# Patient Record
Sex: Female | Born: 1955 | Race: Black or African American | Hispanic: No | Marital: Single | Smoking: Former smoker
Health system: Southern US, Community
[De-identification: ages and names within clinical notes are randomized; demographics above are authoritative.]

## PROBLEM LIST (undated history)

## (undated) DIAGNOSIS — R9431 Abnormal electrocardiogram [ECG] [EKG]: Secondary | ICD-10-CM

## (undated) DIAGNOSIS — K219 Gastro-esophageal reflux disease without esophagitis: Secondary | ICD-10-CM

## (undated) DIAGNOSIS — I209 Angina pectoris, unspecified: Secondary | ICD-10-CM

## (undated) DIAGNOSIS — N950 Postmenopausal bleeding: Secondary | ICD-10-CM

## (undated) DIAGNOSIS — G43909 Migraine, unspecified, not intractable, without status migrainosus: Secondary | ICD-10-CM

## (undated) DIAGNOSIS — R42 Dizziness and giddiness: Secondary | ICD-10-CM

## (undated) DIAGNOSIS — E7800 Pure hypercholesterolemia, unspecified: Secondary | ICD-10-CM

## (undated) DIAGNOSIS — I10 Essential (primary) hypertension: Secondary | ICD-10-CM

## (undated) DIAGNOSIS — I82402 Acute embolism and thrombosis of unspecified deep veins of left lower extremity: Secondary | ICD-10-CM

## (undated) DIAGNOSIS — R011 Cardiac murmur, unspecified: Secondary | ICD-10-CM

## (undated) HISTORY — PX: CHOLECYSTECTOMY: SHX55

## (undated) HISTORY — DX: Gastro-esophageal reflux disease without esophagitis: K21.9

---
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
## 2003-05-20 ENCOUNTER — Encounter: Payer: Self-pay | Admitting: Emergency Medicine

## 2003-05-20 ENCOUNTER — Encounter: Payer: Self-pay | Admitting: Cardiology

## 2003-05-20 ENCOUNTER — Inpatient Hospital Stay (HOSPITAL_COMMUNITY): Admission: EM | Admit: 2003-05-20 | Discharge: 2003-05-22 | Payer: Self-pay

## 2003-05-21 ENCOUNTER — Encounter: Payer: Self-pay | Admitting: Family Medicine

## 2003-06-05 ENCOUNTER — Encounter: Admission: RE | Admit: 2003-06-05 | Discharge: 2003-06-05 | Payer: Self-pay | Admitting: Internal Medicine

## 2003-08-30 ENCOUNTER — Emergency Department (HOSPITAL_COMMUNITY): Admission: EM | Admit: 2003-08-30 | Discharge: 2003-08-30 | Payer: Self-pay | Admitting: Emergency Medicine

## 2004-09-28 ENCOUNTER — Emergency Department (HOSPITAL_COMMUNITY): Admission: EM | Admit: 2004-09-28 | Discharge: 2004-09-28 | Payer: Self-pay | Admitting: Emergency Medicine

## 2005-01-04 ENCOUNTER — Emergency Department (HOSPITAL_COMMUNITY): Admission: EM | Admit: 2005-01-04 | Discharge: 2005-01-04 | Payer: Self-pay | Admitting: Emergency Medicine

## 2005-01-05 ENCOUNTER — Emergency Department (HOSPITAL_COMMUNITY): Admission: EM | Admit: 2005-01-05 | Discharge: 2005-01-06 | Payer: Self-pay | Admitting: Emergency Medicine

## 2005-01-10 ENCOUNTER — Encounter: Admission: RE | Admit: 2005-01-10 | Discharge: 2005-01-10 | Payer: Self-pay | Admitting: General Surgery

## 2005-01-13 ENCOUNTER — Encounter: Admission: RE | Admit: 2005-01-13 | Discharge: 2005-01-13 | Payer: Self-pay | Admitting: General Surgery

## 2005-01-18 ENCOUNTER — Encounter (INDEPENDENT_AMBULATORY_CARE_PROVIDER_SITE_OTHER): Payer: Self-pay | Admitting: *Deleted

## 2005-01-18 ENCOUNTER — Ambulatory Visit (HOSPITAL_COMMUNITY): Admission: RE | Admit: 2005-01-18 | Discharge: 2005-01-19 | Payer: Self-pay | Admitting: General Surgery

## 2007-10-17 ENCOUNTER — Emergency Department (HOSPITAL_COMMUNITY): Admission: EM | Admit: 2007-10-17 | Discharge: 2007-10-17 | Payer: Self-pay | Admitting: Emergency Medicine

## 2008-01-04 ENCOUNTER — Emergency Department (HOSPITAL_COMMUNITY): Admission: EM | Admit: 2008-01-04 | Discharge: 2008-01-04 | Payer: Self-pay | Admitting: Family Medicine

## 2009-10-16 HISTORY — PX: CHOLECYSTECTOMY: SHX55

## 2011-03-03 NOTE — Discharge Summary (Signed)
Kristi Meyers, Kristi Meyers NO.:  [id number]   MEDICAL RECORD NO.:  [id number]                   PATIENT TYPE:  INP   LOCATION:  3730                                 FACILITY:  [facility]   PHYSICIAN:  Foye Clock, MD                   DATE OF BIRTH:  05-23-1956   DATE OF ADMISSION:  05/19/2003  DATE OF DISCHARGE:  05/22/2003                                 DISCHARGE SUMMARY   DISCHARGE DIAGNOSES:  1. Hypertensive emergency.  2. Tobacco use for 30 years.  3. Marijuana use.  4. Abnormal electrocardiogram.  5. Anemia.   DISCHARGE MEDICATIONS:  1. Hydrochlorothiazide 25 mg 1 p.o. every day.  2. Metoprolol 50 mg 1 pill b.i.d.  3. Lotrel 5/21 pills b.i.d.  4. Aspirin 325 mg 1 pill daily.  5. Ferrous sulfate 325 mg 1 pill t.i.d.  6. Colace 1 pill b.i.d. as needed for constipation.   DISPOSITION:  The patient is stable. She will need follow up concerning  tobacco abuse with question of using nicotine replacement.   PROCEDURES PERFORMED:  1. Persantine Cardiolite.  2. A 2D echocardiogram.  3. Head CT scan without contrast.  4. Chest x-ray.   CONSULTS:  Eduardo Osier. Sharyn Lull, M.D.   HISTORY OF PRESENT ILLNESS:  Ms. Sinopoli is a 55 year old African American  female with a past medical history significant for hypertension, who quit  her medications approximately 6 months ago because she was concerned about  weight loss. Prior to admission she started having increased frequency of  headache and since then has tried over-the-counter aspirin, ibuprofen and  Tylenol. Earlier on the morning of admission she was sent home from work  when her symptoms were evaluated by an on-site nurse who found her blood  pressure to be greater than 200 plus systolic blood pressure. She denies  having any chest pain, shortness of breath, palpitations or diaphoresis. She  did have a transient episode of left sided weakness that lasted less than 30  minutes while at work and resolved  while she in the emergency department.  She does have positive risk factors of tobacco use for 30 years but denies  any  IV drug use, cocaine or ethanol and she has not been using over-the-  counter decongestants prior to admission.   ALLERGIES:  No known drug allergies.   PAST MEDICAL HISTORY:  Significant only for hypertension as listed above.  Treated at [facility].   SOCIAL HISTORY:  She is single, working at [employer] and lives in  [place].   FAMILY HISTORY:  Significant for no known  medical problems, no history of  MI, no history of cerebrovascular accident.   REVIEW OF SYSTEMS:  Positive for constipation.   PHYSICAL EXAMINATION:  VITAL SIGNS:  Temperature 97.5, pulse 84, blood  pressure 238/140, treated with Labetalol 20 mg IV x2 and Clonidine 0.2 mg  p.o. x1 and resulting  blood pressure was 185/108. Respiratory rate 20.  Oxygen saturation 99% on room air.  GENERAL: She is a well developed, well nourished African American female  lying in bed in no apparent distress.  HEENT:  Pupils equally round, reactive to light and accomodation.  Extraocular movements intact. No papilledema.  NECK:  No jugular venous distention, no masses.  RESPIRATORY:  Clear to auscultation bilaterally. Good air movement.  CARDIOVASCULAR:  Regular rate and rhythm. No rubs or gallops, occasional  ectopic beat, positive systolic murmur.  NEUROLOGIC:  Cranial nerves 2 to 12 grossly intact. No focal deficits. No  deficits found. She was symmetric. Sight she was appropriate.   LABORATORY DATA:  Chest x-ray showed cardiomegaly  with vascular congestion.  Head CT scan without contrast media showed right cerebellar and left frontal  infarct, likely more remote, however, consider further  evaluation  or  imaging as indicated. Also moderate periventricular white matter  hypodensity, question of small vessel ischemic  changes. An EKG showed  normal sinus rhythm with increased PR interval consistent with  1st degree AV  block, old septal infarct and left ventricular hypertrophy by voltage.  Inferior lateral T-wave inversions, no ST elevations or depressions.   LABORATORY DATA:  Sodium 139, potassium 3.6, chloride 106, CO2 25, BUN 13,  creatinine 0.9, glucose 94. White blood cell count 8.5, hemoglobin 13.1,  platelets 367, ANC 5.8, MCV 81, red cell distribution of 15.8. Bilirubin  0.6, alkaline phosphatase 63, AST 21, ALT 14, protein 7.9, albumin 3.9,  calcium 9.4.   HOSPITAL COURSE:  PROBLEM #1, HYPERTENSIVE URGENCY: Because of the  neurologic symptoms and elevated blood pressure on admission, a CT scan was  done to rule out the possibility of cerebrovascular accident. The results  were negative for an acute bleed. The patient was also treated acutely in  the emergency room for her blood pressure which resulted in systolic  blood  pressure of 160s to 180s over a diastolic blood pressure of the 90s to 100s.  The patient was started on hydrochlorothiazide, Metoprolol and Lotrel for  her blood pressure management. At the time of discharge the patient's blood  pressure was averaging systolic of 180s/100.   PROBLEM #2, ABNORMAL EKG:  Because her EKG showed normal sinus rhythm with  left ventricular hypertrophy and poor R-wave progression in leads V1 and V2  consistent with infarct, and she had a systolic murmur on examination, a 2D  echocardiogram was performed. The results stated that she had global  hypokinesis in the 60 to 70 anterior septal region with evidence of stress  induced ischemia and an ejection fraction of 87%.   Cardiology was consulted to determine if the EKG changes were due to left  ventricular hypertrophy versus coronary artery disease. The patient was seen  by Dr. Eduardo Osier. Harwani and was scheduled for a Persantine Cardiolite. After  reviewing the results of the Cardiolite and reviewing the results of the 2D echocardiogram, he  determined that the EKG changes were the  results of left  ventricular hypertrophy secondary to chronic uncontrolled hypertension. He  agreed with the management  of her hypertension and decided no further  evaluation  would be necessary at this time.   PROBLEM #3, TOBACCO AND MARIJUANA USE:  On admission the urine drug screen  showed positive for marijuana use. The patient was counseled on the risks of  tobacco and marijuana use. It was reiterated that tobacco increases the risk  of coronary artery disease. The patient was encouraged  to stop smoking as  well as stopping marijuana use. We discussed the option of using nicotine  replacements in the future. Because the patient had not at this time  considered a quit plan with a quit date, it was decided that we would follow  up this issue at her hospital  followup appointment.   PROBLEM #4, ANEMIA:  During the hospitalization  the patient was  found to  have a hemoglobin of 11, down from a hemoglobin of 13.1 on admission. She  was Guaiac negative. Her MCV was normal, but iron and ferritin were checked  and are pending at this time. The patient was, however, empirically treated  with ferrous sulfate for her anemia.   PROBLEM #5, DYSLIPIDEMIA:  As part of her cardiac workup, the patient was  tested for hyperlipidemia. Her laboratory results include a total  cholesterol of 186 with an HDL of 53 and an LDL of 123. Given risk factors  of tobacco abuse and hypertension, it was recommended that the patient begin  treatment for her dyslipidemia with a statin of choice as determined by her  primary care physician.   DISCHARGE LABORATORY DATA:  White blood cell count 5.5, hemoglobin 10.5,  hematocrit 30.9, platelets 382. Sodium 141, potassium 4, chloride 109, CO2  27, glucose 87, BUN 11, creatinine 0.9, calcium 8.9.                                                 Foye Clock, MD    JH/MEDQ  D:  05/31/2003  T:  05/31/2003  Job:  [id number]   cc:   [facility]   Eduardo Osier.  Sharyn Lull, M.D.  [address]  [place]  [place] [address]  Fax: [phone]

## 2011-03-03 NOTE — Op Note (Signed)
NAMETEQUILA, ROTTMANN NO.:  [id number]   MEDICAL RECORD NO.:  [id number]          PATIENT TYPE:  OIB   LOCATION:  2550                         FACILITY:  [facility]   PHYSICIAN:  Kristi Meyers, M.D.DATE OF BIRTH:  02/27/1956   DATE OF PROCEDURE:  01/18/2005  DATE OF DISCHARGE:                                 OPERATIVE REPORT   PREOPERATIVE DIAGNOSIS:  Chronic cholecystitis with stones.   POSTOPERATIVE DIAGNOSIS:  Chronic cholecystitis with stones. Questionable  common duct stone.   OPERATION PERFORMED:  Laparoscopic cholecystectomy with cholangiogram.   SURGEON:  Kristi Pancoast. Kristi Meyers, M.D.   ANESTHESIA:  General.   INDICATIONS FOR PROCEDURE:  Elloise Meyers is a 55 year old female whom I  first saw when I was on call for [facility]  approximately two weeks ago when she was seen in  the emergency room there.  She had presented with upper abdominal pain and had been seen by the  emergency room physician and ultrasound of the abdomen was obtained which  showed a contracted gallbladder with stones.  She had been seen in the  emergency room at [facility] the previous day and had been placed on antibiotics  for supposedly a urinary tract infection, but continued to have symptoms and  therefore had the following day gone to [facility]. I saw her and on  examination, her x-rays of the CT done when she was at [facility] showed that she  had a significantly dilated colon with a large amount of stool in the right  colon and I recommended that we try to get her cleaned out with laxatives  and then saw her back in the office.  On examination there, she was doing  better but was still kind of cramping and bloating and her abdominal pain is  not really localized to the right upper quadrant.  On examination in the  office, though she was certainly not acutely ill, I found that she had  strongly guaiac positive stools and recommended that we get a flat and  upright abdominal plain film which was done.  This showed that the large  stool and all had been evacuated and then I talked with Kristi Meyers and they  did an virtual colonoscopy that showed no abnormalities.  She was then  scheduled for a lap gallbladder and repeat examination today, she is not  distended.  There is no rectal stool and the little bit of stool that is  there is definitely Hemoccult negative.  Her hemoglobin, however, has  drifted down slightly and I don't have a real explanation for that.  Her  liver function studies are perfectly normal. I elected to proceed on with a  laparoscopic cholecystectomy.  We will kind of be following her stool  hemoccults in the office.   DESCRIPTION OF PROCEDURE:  The patient was taken to the operative suite.  She has 3 g of Unasyn.  She has PAS stockings and was positioned on the  operating room table.  Induction of general anesthesia and endotracheal  tube, oral tube into the stomach.  The abdomen was prepped  with Betadine  surgical solution.  She had had a previous little supraumbilical incision  for some type of laparoscopic procedure, probably gyn.  I opened carefully  below the umbilicus a little vertical incision.  The fascia was identified  and picked up between two Kochers and a small opening made into the  peritoneal cavity.  A pursestring suture was placed.  A Hasson cannula was  introduced and she does have this kind of contracted gallbladder.  There  were definitely adhesions around it. We did kind of a pantoscopic  view  after the upper 10 mm trocar had been placed and Kristi Meyers had placed the  two lateral 5 mm trocars.  There was no evidence of any peritoneal seeding  or other abnormality. She has got a few little adhesions in the pelvis.  There was no free fluid in the pelvis and the small bowel is definitely not  dilated.  The colon was completely unremarkable.  The gallbladder was  grasped, retracted upward and outward.  The  peritoneum was opened after the  adhesions had been taken down and then the gallbladder was identified as was  the cystic artery and there was a stone impacted in the neck of the cystic  duct, gallbladder junction.  This was flipped back into the gallbladder, a  clip placed and then a small opening made proximal to this and a  Cholangiocath placed.  X-ray was obtained and there certainly appears to be  a possible stone in the distal common bile duct. I could not get it to flush  with irrigating with saline, etc. and did not repeat the x-rays and will  wait for the results with the radiologists' interpretation.  Review of the  lab studies, all of her liver function studies have always been normal.  The  cystic duct was definitely freed up, identified, triply clipped and then  divided.  The cystic artery had been doubly divided, doubly clipped  proximally, distally singly and divided and then the gallbladder was freed  up from its bed in the liver.  The gallbladder was grasped with the camera  in the upper 10 mm port and the gallbladder brought out through the  umbilical fascial defect.  An additional figure-of-eight of 0 Vicryl was  placed in the fascia.  The fascia was anesthetized in the umbilicus and then  the reinspection, no evidence of any bleeding.  The irrigated fluid had been  removed and the 5 mm ports were drawn.  The carbon dioxide was released and  the upper 10 mm trocar withdrawn.  The patient tolerated the procedure  nicely and the subcutaneous wounds were closed with 4-0 Vicryl and the  Benzoin and Steri-Strips were placed on the skin.  The patient tolerated the  procedure nicely and was taken to the recovery room stable, extubated.  I  will check with the radiologists for their opinion and may ask one of the  gastroenterologists to see her, since really a common duct stone would sort  of explain her sort of vague abdominal symptoms that appeared to be different when she has  been seen at different times during this evaluation.      WJW/MEDQ  D:  01/18/2005  T:  01/18/2005  Job:  [id number]

## 2011-03-03 NOTE — Consult Note (Signed)
[name], CARRE NO.:  [id number]   MEDICAL RECORD NO.:  [id number]          PATIENT TYPE:  EMS   LOCATION:  ED                           FACILITY:  [facility]   PHYSICIAN:  Anselm Pancoast. Weatherly, M.D.DATE OF BIRTH:  1956/04/27   DATE OF CONSULTATION:  01/05/2005  DATE OF DISCHARGE:  01/06/2005                                   CONSULTATION   CHIEF COMPLAINT:  Abdominal pain.   HISTORY:  Kristi Meyers is a 55 year old black female who was seen in the  emergency room at [facility] on January 04, 2005; had acute abdominal  series, laboratory studies, amylase, lipase, and a Trichomonas was noted in  her urine and she was treated with Vicodin, Cipro, and also Compazine and  released. She started having more pain and this time came to the [facility]  emergency room where she was seen by Dr. Ethelda Chick. He thought that she was  tender in the upper abdomen and obtained an ultrasound that showed a  contracted gallbladder that was not thought to be acute, but called and  asked me to see her with the presumptive diagnosis of acute cholecystitis.  She had been given pain medication and I saw her an hour or two later, and  on examination when I saw her she was definitely not tender in the right  upper quadrant and states that the pain has been predominantly that of lower  abdomen, a cramping sensation, and pain with having bowel movements. I had  Dr. Stacie Acres, who was now working - Dr. Ethelda Chick had left - to examine the  patient and he was also in agreement that she was not tender in the right  upper quadrant and she was complaining of more pain on the right lower  abdomen on our examination. Her repeat white count today was 8200 where it  had been 8400 in the [facility] ER yesterday and we elected to go ahead and  proceed with a CT with oral and IV contrast. The CT was read as normal by  the radiologist, but on review of the CT she has a large amount of solid  stool in the right  colon, the cecum, transverse colon, there is not actually  stool in the rectum, but I think that this cramping pain that she is  experiencing is basically constipation and the gallbladder is definitely  thickened. 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 I think that  her symptoms that she is presently experiencing are the cramping related to  the constipation and I think that the CT contrast and if she will be on a  clear liquid diet for a couple of days will clean out her colon and this  cramping sensation will subside. There is no evidence of any diverticulitis  on the CT and there is no evidence of any obstruction of her colon. I have  asked that she follow up with [place] in the office and we will have her  scheduled for an elective  cholecystectomy, but I would definitely not take  the hydrocodone as I think that this is aggravating the constipation. Her  symptoms are more kind of cramping, bloated, constipation-type symptoms and  not that of an acute like a PID or appendicitis and etc. Dr. Ethelda Chick did  do a rectovaginal examination and describes that she was not having any  cramping or tenderness on vaginal manipulation.      WJW/MEDQ  D:  01/06/2005  T:  01/06/2005  Job:  [id number]

## 2011-03-29 ENCOUNTER — Emergency Department (HOSPITAL_COMMUNITY)
Admission: EM | Admit: 2011-03-29 | Discharge: 2011-03-29 | Disposition: A | Discharge status: Home / Self Care | Payer: BC Managed Care – PPO | Attending: Emergency Medicine | Admitting: Emergency Medicine

## 2011-03-29 DIAGNOSIS — R252 Cramp and spasm: Secondary | ICD-10-CM | POA: Insufficient documentation

## 2011-03-29 DIAGNOSIS — E876 Hypokalemia: Secondary | ICD-10-CM | POA: Insufficient documentation

## 2011-03-29 DIAGNOSIS — I10 Essential (primary) hypertension: Secondary | ICD-10-CM | POA: Insufficient documentation

## 2011-03-29 DIAGNOSIS — M79609 Pain in unspecified limb: Secondary | ICD-10-CM | POA: Insufficient documentation

## 2011-03-29 DIAGNOSIS — E785 Hyperlipidemia, unspecified: Secondary | ICD-10-CM | POA: Insufficient documentation

## 2011-03-29 DIAGNOSIS — Z79899 Other long term (current) drug therapy: Secondary | ICD-10-CM | POA: Insufficient documentation

## 2011-03-29 LAB — POCT I-STAT, CHEM 8
BUN: 11 mg/dL (ref 6–23)
Calcium, Ion: 1.16 mmol/L (ref 1.12–1.32)
Chloride: 107 mEq/L (ref 96–112)
Creatinine, Ser: 0.8 mg/dL (ref 0.4–1.2)
Glucose, Bld: 90 mg/dL (ref 70–99)
HCT: 36 % (ref 36.0–46.0)
Hemoglobin: 12.2 g/dL (ref 12.0–15.0)
Potassium: 3.4 mEq/L — ABNORMAL LOW (ref 3.5–5.1)
Sodium: 143 mEq/L (ref 135–145)
TCO2: 26 mmol/L (ref 0–100)

## 2011-05-24 ENCOUNTER — Ambulatory Visit: Payer: BC Managed Care – PPO | Admitting: Family Medicine

## 2015-07-07 ENCOUNTER — Ambulatory Visit: Payer: BC Managed Care – PPO | Admitting: Podiatry

## 2016-10-06 ENCOUNTER — Other Ambulatory Visit: Payer: Self-pay | Admitting: Cardiology

## 2016-10-06 DIAGNOSIS — R079 Chest pain, unspecified: Secondary | ICD-10-CM

## 2016-10-23 ENCOUNTER — Encounter (HOSPITAL_COMMUNITY): Admission: RE | Admit: 2016-10-23 | Payer: BC Managed Care – PPO | Source: Ambulatory Visit

## 2016-10-30 ENCOUNTER — Encounter (HOSPITAL_COMMUNITY): Admission: RE | Admit: 2016-10-30 | Payer: BC Managed Care – PPO | Source: Ambulatory Visit

## 2016-11-17 ENCOUNTER — Ambulatory Visit (HOSPITAL_COMMUNITY)
Admission: RE | Admit: 2016-11-17 | Discharge: 2016-11-17 | Disposition: A | Discharge status: Home / Self Care | Payer: BC Managed Care – PPO | Source: Ambulatory Visit | Attending: Cardiology | Admitting: Cardiology

## 2016-11-17 ENCOUNTER — Encounter (HOSPITAL_COMMUNITY): Payer: Self-pay | Admitting: Radiology

## 2016-11-17 DIAGNOSIS — R079 Chest pain, unspecified: Secondary | ICD-10-CM | POA: Diagnosis not present

## 2016-11-17 MED ORDER — REGADENOSON 0.4 MG/5ML IV SOLN
0.4000 mg | Freq: Once | INTRAVENOUS | Status: AC
  Administered 2016-11-17: 0.4 mg via INTRAVENOUS

## 2016-11-17 MED ORDER — TECHNETIUM TC 99M TETROFOSMIN IV KIT
10.0000 | PACK | Freq: Once | INTRAVENOUS | Status: AC | PRN
  Administered 2016-11-17: 10 via INTRAVENOUS

## 2016-11-17 MED ORDER — TECHNETIUM TC 99M TETROFOSMIN IV KIT
30.0000 | PACK | Freq: Once | INTRAVENOUS | Status: AC | PRN
  Administered 2016-11-17: 30 via INTRAVENOUS

## 2016-11-17 MED ORDER — REGADENOSON 0.4 MG/5ML IV SOLN
INTRAVENOUS | Status: AC
  Administered 2016-11-17: 0.4 mg via INTRAVENOUS
  Filled 2016-11-17: qty 5

## 2017-07-16 ENCOUNTER — Encounter (HOSPITAL_COMMUNITY): Payer: Self-pay | Admitting: Emergency Medicine

## 2017-07-16 ENCOUNTER — Ambulatory Visit (HOSPITAL_COMMUNITY)
Admission: EM | Admit: 2017-07-16 | Discharge: 2017-07-16 | Disposition: A | Discharge status: Home / Self Care | Payer: BC Managed Care – PPO | Attending: Internal Medicine | Admitting: Internal Medicine

## 2017-07-16 DIAGNOSIS — M25512 Pain in left shoulder: Secondary | ICD-10-CM | POA: Diagnosis not present

## 2017-07-16 DIAGNOSIS — M2550 Pain in unspecified joint: Secondary | ICD-10-CM

## 2017-07-16 DIAGNOSIS — M25561 Pain in right knee: Secondary | ICD-10-CM | POA: Diagnosis not present

## 2017-07-16 HISTORY — DX: Dizziness and giddiness: R42

## 2017-07-16 HISTORY — DX: Essential (primary) hypertension: I10

## 2017-07-16 HISTORY — DX: Pure hypercholesterolemia, unspecified: E78.00

## 2017-07-16 MED ORDER — METHYLPREDNISOLONE SODIUM SUCC 125 MG IJ SOLR
INTRAMUSCULAR | Status: AC
  Filled 2017-07-16: qty 2

## 2017-07-16 MED ORDER — MELOXICAM 7.5 MG PO TABS: 7.5000 mg | ORAL_TABLET | Freq: Two times a day (BID) | ORAL | 0 refills | Status: AC

## 2017-07-16 MED ORDER — METHYLPREDNISOLONE SODIUM SUCC 125 MG IJ SOLR
125.0000 mg | Freq: Once | INTRAMUSCULAR | Status: AC
  Administered 2017-07-16: 125 mg via INTRAMUSCULAR

## 2017-07-16 NOTE — Discharge Instructions (Addendum)
Achiness particularly in left shoulder and around right knee seem consistent with arthritis. Trial of meloxicam, anti-inflammatory/pain reliever, prescription sent to the pharmacy. Injection of Solu-Medrol given at the urgent care today. Note for work today and tomorrow. Follow-up with a sports medicine provider or orthopedist to discuss further treatment options, if symptoms persist.

## 2017-07-16 NOTE — ED Provider Notes (Signed)
[facility]    CSN: [id number] Arrival date & time: 07/16/17  1004     History   Chief Complaint Chief Complaint  Patient presents with  . Extremity Pain    HPI NECIE Meyers is a 61 y.o. female. She presents today with one-month history of achiness in her legs particularly behind the right knee and in her shoulders particularly the left shoulder. She thinks maybe the air conditioning at home is chilling her, she is better when she is up and around during the day, and has most difficulty at night and when she is first getting started in the morning.    HPI  Past Medical History:  Diagnosis Date  . High cholesterol   . Hypertension   . Vertigo      Past Surgical History:  Procedure Laterality Date  . CHOLECYSTECTOMY       Home Medications    Prior to Admission medications   Medication Sig Start Date End Date Taking? Authorizing Provider  atorvastatin (LIPITOR) 40 MG tablet Take 40 mg by mouth daily.   Yes [provider]  carvedilol (COREG) 25 MG tablet Take 25 mg by mouth 2 (two) times daily with a meal.   Yes [provider]  meclizine (ANTIVERT) 12.5 MG tablet Take 12.5 mg by mouth 2 (two) times daily.   Yes [provider]  Olmesartan-Amlodipine-HCTZ 40-10-25 MG TABS Take 1 tablet by mouth daily.   Yes [provider]  potassium chloride SA (K-DUR,KLOR-CON) 20 MEQ tablet Take 20 mEq by mouth daily.   Yes [provider]  meloxicam (MOBIC) 7.5 MG tablet Take 1 tablet (7.5 mg total) by mouth 2 (two) times daily. 07/16/17 07/26/17  Sherlene Shams, MD    Family History History reviewed. No pertinent family history.  Social History Social History  Substance Use Topics  . Smoking status: Current Some Day Smoker    Types: Cigarettes  . Smokeless tobacco: Never Used  . Alcohol use No     Allergies   Patient has no known allergies.   Review of Systems Review of Systems  All other systems reviewed  and are negative.    Physical Exam Triage Vital Signs ED Triage Vitals [07/16/17 1029]  Enc Vitals Group     BP 111/73     Pulse Rate 72     Resp      Temp 97.8 F (36.6 C)     Temp Source Oral     SpO2 100 %     Weight      Height      Pain Score      Pain Loc    Updated Vital Signs BP 111/73 (BP Location: Left Arm)   Pulse 72   Temp 97.8 F (36.6 C) (Oral)   LMP 11/17/2008 (Approximate)   SpO2 100%     Physical Exam  Constitutional: She is oriented to person, place, and time. No distress.  HENT:  Head: Atraumatic.  Eyes:  Conjugate gaze observed, no eye redness/discharge  Neck: Neck supple.  Cardiovascular: Normal rate and regular rhythm.   Pulmonary/Chest: No respiratory distress. She has no wheezes. She has no rales.  Lungs clear, symmetric breath sounds  Abdominal: She exhibits no distension.  Musculoskeletal: Normal range of motion. She exhibits no edema.  Range of motion in both knees is similar, patient is able to climb onto the exam table but slowly. No effusion palpated. No warmth. Able to raise both arms overhead, but discomfort  particularly on the left.  Neurological: She is alert and oriented to person, place, and time.  Skin: Skin is warm and dry.  Nursing note and vitals reviewed.    UC Treatments / Results   Procedures Procedures (including critical care time)  Medications Ordered in UC Medications  methylPREDNISolone sodium succinate (SOLU-MEDROL) 125 mg/2 mL injection 125 mg (125 mg Intramuscular Given 07/16/17 1130)   Final Clinical Impressions(s) / UC Diagnoses   Final diagnoses:  Arthralgia, unspecified joint   Achiness particularly in left shoulder and around right knee seem consistent with arthritis. Trial of meloxicam, anti-inflammatory/pain reliever, prescription sent to the pharmacy. Injection of Solu-Medrol given at the urgent care today. Note for work today and tomorrow. Follow-up with a sports medicine provider or orthopedist  to discuss further treatment options, if symptoms persist.  New Prescriptions New Prescriptions   MELOXICAM (MOBIC) 7.5 MG TABLET    Take 1 tablet (7.5 mg total) by mouth 2 (two) times daily.     Controlled Substance Prescriptions  Controlled Substance Registry consulted? No   Sherlene Shams, MD 07/18/17 2116

## 2017-07-16 NOTE — ED Triage Notes (Signed)
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  For the last month she has been having bilateral leg and arm pain.

## 2017-07-26 ENCOUNTER — Ambulatory Visit (INDEPENDENT_AMBULATORY_CARE_PROVIDER_SITE_OTHER): Payer: BC Managed Care – PPO | Admitting: Family Medicine

## 2017-07-26 ENCOUNTER — Encounter: Payer: Self-pay | Admitting: Family Medicine

## 2017-07-26 VITALS — BP 126/78 | HR 68 | Temp 98.4°F | Resp 14 | Ht 64.25 in | Wt 208.3 lb

## 2017-07-26 DIAGNOSIS — G8929 Other chronic pain: Secondary | ICD-10-CM | POA: Diagnosis not present

## 2017-07-26 DIAGNOSIS — Z7689 Persons encountering health services in other specified circumstances: Secondary | ICD-10-CM

## 2017-07-26 DIAGNOSIS — M25561 Pain in right knee: Secondary | ICD-10-CM | POA: Diagnosis not present

## 2017-07-26 DIAGNOSIS — I10 Essential (primary) hypertension: Secondary | ICD-10-CM

## 2017-07-26 DIAGNOSIS — M25472 Effusion, left ankle: Secondary | ICD-10-CM

## 2017-07-26 NOTE — Progress Notes (Signed)
Kristi Meyers presents to clinic today to establish care.  SUBJECTIVE: PMH: Pt is a 61 yo AAF with pmh sig for HTN.  Pt is followed by Cardiology, Dr. Leo Grosser? (office down from [facility]).  Knee pain, acute: -pt endorses knee pain x "a while" -Kristi Meyers endorses knee/leg aching when cold outside -recently seen by urgent care, told arthritis, given Mobic 7.5 mg and given Solu-Medrol IM -Kristi Meyers tried rubbing alcohol, Aspercreme -Kristi Meyers is out of Mobic, states helped some -Kristi Meyers endorses walking slow.  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 Kristi Meyers endorses smoking a few cigarettes per week. Her husband gets on her for smoking.  FMHx: Daughter-arthritis, DM   Past Medical History:  Diagnosis Date  . GERD (gastroesophageal reflux disease)   . High cholesterol   . Hypertension   . Vertigo     Past Surgical History:  Procedure Laterality Date  . CHOLECYSTECTOMY      Current Outpatient Prescriptions on File Prior to Visit  Medication Sig Dispense Refill  . atorvastatin (LIPITOR) 40 MG tablet Take 40 mg by mouth daily.    . carvedilol (COREG) 25 MG tablet Take 25 mg by mouth 2 (two) times daily with a meal.    . meclizine (ANTIVERT) 12.5 MG tablet Take 12.5 mg by mouth 2 (two) times daily.    . meloxicam (MOBIC) 7.5 MG tablet Take 1 tablet (7.5 mg total) by mouth 2 (two) times daily. 20 tablet 0  . Olmesartan-Amlodipine-HCTZ 40-10-25 MG TABS Take 1 tablet by mouth daily.    . potassium chloride SA (K-DUR,KLOR-CON)  20 MEQ tablet Take 20 mEq by mouth daily.     No current facility-administered medications on file prior to visit.     No Known Allergies  No family history on file.  Social History   Social History  . Marital status: Single    Spouse name: N/A  . Number of children: N/A  . Years of education: N/A   Occupational History  . Not on file.   Social History Main Topics  . Smoking status: Current Some Day Smoker    Types: Cigarettes  . Smokeless tobacco: Never Used  . Alcohol use No  . Drug use: No  . Sexual activity: Not on file   Other Topics Concern  . Not on file   Social History Narrative  . No narrative on file    ROS General: Denies fever, chills, night sweats, changes in weight, changes in appetite HEENT: Denies headaches, ear pain, changes in vision, rhinorrhea, sore throat CV: Denies CP, palpitations, SOB, orthopnea +Left ankle edema Pulm: Denies SOB, cough, wheezing GI: Denies abdominal pain, nausea, vomiting, diarrhea, constipation GU: Denies dysuria, hematuria, frequency, vaginal discharge Msk: Denies muscle cramps, joint pains  + right knee pain Neuro: Denies weakness, numbness, tingling Skin: Denies rashes, bruising Psych: Denies depression, anxiety, hallucinations  BP 126/78 (BP Location: Left Arm, Kristi Meyers Position: Sitting, Cuff Size: Large)   Pulse 68   Temp 98.4 F (36.9 C) (Oral)   Resp 14  Ht 5' 4.25" (1.632 m)   Wt 208 lb 5 oz (94.5 kg)   LMP 11/17/2008 (Approximate)   SpO2 97%   BMI 35.48 kg/m   Physical Exam Gen. Pleasant, well developed, well-nourished, in NAD HEENT - [place]/AT, PERRL, EOMI, conjunctive clear, no scleral icterus, no nasal drainage, pharynx without erythema or exudate. Lungs: no use of accessory muscles, CTAB, no wheezes, rales or rhonchi Cardiovascular: RRR, No r/g/m, no peripheral edema Abdomen: BS present, soft, nontender,nondistended Musculoskeletal: No deformities, moves all four extremities, no cyanosis or  clubbing, normal tone.  Left ankle with trace edema. Both knees normal in appearance. No tenderness to palpation of bilateral knees. Crepitus of bilateral knees. Neuro:  A&Ox3, CN II-XII intact, normal gait slightly slowed Skin:  Warm, dry, intact, no lesions Psych: normal affect, mood apropriate  No results found for this or any previous visit (from the past 2160 hour(s)).  Assessment/Plan: Chronic pain of right knee  -Discussed likely causes of knee pain, including arthritis -Kristi Meyers to try arthritis strength Tylenol 1000 mg twice a day or regular strength Tylenol if unable to find arthritis strength. -It is okay to continue using liniment creams when necessary -If pain continues despite Tylenol will consider NSAID or steroid injections - Plan: DG Knee Complete 4 Views Right  Essential hypertension -Followed by cardiology -Continue checking BP -Continue Coreg 25 mg twice a day and olmesartan-amlodipine daily  Edema of left ankle -Discussed possible causes including increased salt in diet, prolonged standing or sitting -Discussed elevating feet when sitting -Discussed TED/compression hose  Establish care -Records release   F/u in 1 month for knee pain

## 2017-07-26 NOTE — Patient Instructions (Addendum)
Please go to the [facility] on [address], [place] [id number] for your knee xray.  The Phone number to the clinic is [phone].   You can take Arthritis strength Tylenol 1000 mg twice a day as needed for your knee pain.  If you cannot find the Arthritis strength it is ok to take the regular Tylenol.   Try this for the next few weeks to see if your knee pain improves.  You can also use over the counter creams like Icy Hot, Aspercream, or Biofreeze to help with your knee pain.   Knee Pain, Adult Many things can cause knee pain. The pain often goes away on its own with time and rest. If the pain does not go away, tests may be done to find out what is causing the pain. Follow these instructions at home: Activity  Rest your knee.  Do not do things that cause pain.  Avoid activities where both feet leave the ground at the same time (high-impact activities). Examples are running, jumping rope, and doing jumping jacks. General instructions  Take medicines only as told by your doctor.  Raise (elevate) your knee when you are resting. Make sure your knee is higher than your heart.  Sleep with a pillow under your knee.  If told, put ice on the knee: ? Put ice in a plastic bag. ? Place a towel between your skin and the bag. ? Leave the ice on for 20 minutes, 2-3 times a day.  Ask your doctor if you should wear an elastic knee support.  Lose weight if you are overweight. Being overweight can make your knee hurt more.  Do not use any tobacco products. These include cigarettes, chewing tobacco, or electronic cigarettes. If you need help quitting, ask your doctor. Smoking may slow down healing. Contact a doctor if:  The pain does not stop.  The pain changes or gets worse.  You have a fever along with knee pain.  Your knee gives out or locks up.  Your knee swells, and becomes worse. Get help right away if:  Your knee feels warm.  You cannot move your knee.  You  have very bad knee pain.  You have chest pain.  You have trouble breathing. Summary  Many things can cause knee pain. The pain often goes away on its own with time and rest.  Avoid activities that put stress on your knee. These include running and jumping rope.  Get help right away if you cannot move your knee, or if your knee feels warm, or if you have trouble breathing. This information is not intended to replace advice given to you by your health care provider. Make sure you discuss any questions you have with your health care provider. Document Released: 12/29/2008 Document Revised: 09/26/2016 Document Reviewed: 09/26/2016 Elsevier Interactive Patient Education  2017 Elsevier Inc[employer]  Edema Edema is when you have too much fluid in your body or under your skin. Edema may make your legs, feet, and ankles swell up. Swelling is also common in looser tissues, like around your eyes. This is a common condition. It gets more common as you get older. There are many possible causes of edema. Eating too much salt (sodium) and being on your feet or sitting for a long time can cause edema in your legs, feet, and ankles. Hot weather may make edema worse. Edema is usually painless. Your skin may look swollen or shiny. Follow these instructions at home:  Keep  the swollen body part raised (elevated) above the level of your heart when you are sitting or lying down.  Do not sit still or stand for a long time.  Do not wear tight clothes. Do not wear garters on your upper legs.  Exercise your legs. This can help the swelling go down.  Wear elastic bandages or support stockings as told by your doctor.  Eat a low-salt (low-sodium) diet to reduce fluid as told by your doctor.  Depending on the cause of your swelling, you may need to limit how much fluid you drink (fluid restriction).  Take over-the-counter and prescription medicines only as told by your doctor. Contact a doctor if:  Treatment is not  working.  You have heart, liver, or kidney disease and have symptoms of edema.  You have sudden and unexplained weight gain. Get help right away if:  You have shortness of breath or chest pain.  You cannot breathe when you lie down.  You have pain, redness, or warmth in the swollen areas.  You have heart, liver, or kidney disease and get edema all of a sudden.  You have a fever and your symptoms get worse all of a sudden. Summary  Edema is when you have too much fluid in your body or under your skin.  Edema may make your legs, feet, and ankles swell up. Swelling is also common in looser tissues, like around your eyes.  Raise (elevate) the swollen body part above the level of your heart when you are sitting or lying down.  Follow your doctor's instructions about diet and how much fluid you can drink (fluid restriction). This information is not intended to replace advice given to you by your health care provider. Make sure you discuss any questions you have with your health care provider. Document Released: 03/20/2008 Document Revised: 10/20/2016 Document Reviewed: 10/20/2016 Elsevier Interactive Patient Education  2017 [place] can try wearing TED hose or Compression hose to help with the swelling in your left leg/ankle.

## 2017-10-29 ENCOUNTER — Encounter (HOSPITAL_COMMUNITY): Payer: Self-pay | Admitting: Emergency Medicine

## 2017-10-29 ENCOUNTER — Ambulatory Visit (HOSPITAL_COMMUNITY)
Admission: EM | Admit: 2017-10-29 | Discharge: 2017-10-29 | Disposition: A | Discharge status: Home / Self Care | Payer: BC Managed Care – PPO | Attending: Urgent Care | Admitting: Urgent Care

## 2017-10-29 ENCOUNTER — Other Ambulatory Visit: Payer: Self-pay

## 2017-10-29 DIAGNOSIS — I10 Essential (primary) hypertension: Secondary | ICD-10-CM

## 2017-10-29 DIAGNOSIS — M79604 Pain in right leg: Secondary | ICD-10-CM

## 2017-10-29 DIAGNOSIS — M25561 Pain in right knee: Secondary | ICD-10-CM | POA: Diagnosis not present

## 2017-10-29 DIAGNOSIS — R030 Elevated blood-pressure reading, without diagnosis of hypertension: Secondary | ICD-10-CM

## 2017-10-29 LAB — POCT I-STAT, CHEM 8
BUN: 12 mg/dL (ref 6–20)
Calcium, Ion: 1.19 mmol/L (ref 1.15–1.40)
Chloride: 106 mmol/L (ref 101–111)
Creatinine, Ser: 0.9 mg/dL (ref 0.44–1.00)
Glucose, Bld: 124 mg/dL — ABNORMAL HIGH (ref 65–99)
HCT: 40 % (ref 36.0–46.0)
Hemoglobin: 13.6 g/dL (ref 12.0–15.0)
Potassium: 3.9 mmol/L (ref 3.5–5.1)
Sodium: 145 mmol/L (ref 135–145)
TCO2: 27 mmol/L (ref 22–32)

## 2017-10-29 MED ORDER — TRAMADOL HCL 50 MG PO TABS: 50.0000 mg | ORAL_TABLET | Freq: Three times a day (TID) | ORAL | 0 refills | Status: DC | PRN

## 2017-10-29 MED ORDER — KETOROLAC TROMETHAMINE 60 MG/2ML IM SOLN
60.0000 mg | Freq: Once | INTRAMUSCULAR | Status: AC
  Administered 2017-10-29: 60 mg via INTRAMUSCULAR

## 2017-10-29 MED ORDER — KETOROLAC TROMETHAMINE 60 MG/2ML IM SOLN
INTRAMUSCULAR | Status: AC
  Filled 2017-10-29: qty 2

## 2017-10-29 NOTE — ED Triage Notes (Signed)
Pt c/o R leg pain for a couple of months, denies injury. Pain worse with ambulation.

## 2017-10-29 NOTE — Discharge Instructions (Addendum)
You may take 500mg  Tylenol every 6 hours for pain and inflammation. If you have severe pain, you may try 1000mg  Tylenol every 8 hours as needed. If your pain persists with the stronger dose of Tylenol then you may use Ultracet once every 8 hours as needed. Make sure you hydrate well with at least 2 liters (64 ounces or 1 gallon) of water every day.

## 2017-10-29 NOTE — ED Provider Notes (Signed)
MRN: [id number] DOB: Jan 14, 1956  Subjective:   Kristi Meyers is a 62 y.o. female presenting for 2 month history of recurrent right lower leg pain. Reports pain is throbbing in nature, mostly constant, worse with walking, prolonged standing. Has tried meloxicam without any relief from the last episode of pain she had. Denies fever, redness, swelling, trauma, falls. Has difficulty ambulating at work and is requesting a work note. Smokes a cigarette occasionally, used to smoke more. Drinks ~2 bottles of water per day. Regarding HTN, denies chest pain, heart racing, shob, n/v, abdominal pain.  No current facility-administered medications for this encounter.   Current Outpatient Medications:  .  atorvastatin (LIPITOR) 40 MG tablet, Take 40 mg by mouth daily., Disp: , Rfl:  .  carvedilol (COREG) 25 MG tablet, Take 25 mg by mouth 2 (two) times daily with a meal., Disp: , Rfl:  .  meclizine (ANTIVERT) 12.5 MG tablet, Take 12.5 mg by mouth 2 (two) times daily., Disp: , Rfl:  .  Olmesartan-Amlodipine-HCTZ 40-10-25 MG TABS, Take 1 tablet by mouth daily., Disp: , Rfl:  .  potassium chloride SA (K-DUR,KLOR-CON) 20 MEQ tablet, Take 20 mEq by mouth daily., Disp: , Rfl:    Brena has No Known Allergies.  Arissa  has a past medical history of GERD (gastroesophageal reflux disease), High cholesterol, Hypertension, and Vertigo. Also  has a past surgical history that includes Cholecystectomy.  Objective:   Vitals: BP (!) 153/100 (BP Location: Right Arm) Comment: Notified Tina  Pulse 83   Temp 98.3 F (36.8 C) (Oral)   Resp 18   LMP 11/17/2008 (Approximate)   SpO2 97%   Physical Exam  Constitutional: She is oriented to person, place, and time. She appears well-developed and well-nourished.  Cardiovascular: Normal rate, regular rhythm and intact distal pulses. Exam reveals no gallop and no friction rub.  No murmur heard. Pulmonary/Chest: Effort normal. No respiratory distress. She has no wheezes.  She has no rales.  Musculoskeletal: She exhibits no edema.       Right knee: She exhibits normal range of motion, no swelling, no effusion, no ecchymosis, no deformity, no laceration, no erythema, normal alignment, normal patellar mobility and no bony tenderness. Tenderness (posterior knee) found. No medial joint line, no lateral joint line and no patellar tendon tenderness noted.       Right lower leg: She exhibits no tenderness, no bony tenderness, no swelling (or erythema), no edema, no deformity and no laceration.  Neurological: She is alert and oriented to person, place, and time. She displays normal reflexes.  Skin: Skin is warm and dry.   Results for orders placed or performed during the hospital encounter of 10/29/17 (from the past 24 hour(s))  I-STAT, chem 8     Status: Abnormal   Collection Time: 10/29/17 11:30 AM  Result Value Ref Range   Sodium 145 135 - 145 mmol/L   Potassium 3.9 3.5 - 5.1 mmol/L   Chloride 106 101 - 111 mmol/L   BUN 12 6 - 20 mg/dL   Creatinine, Ser 0.90 0.44 - 1.00 mg/dL   Glucose, Bld 124 (H) 65 - 99 mg/dL   Calcium, Ion 1.19 1.15 - 1.40 mmol/L   TCO2 27 22 - 32 mmol/L   Hemoglobin 13.6 12.0 - 15.0 g/dL   HCT 40.0 36.0 - 46.0 %    Assessment and Plan :   Right leg pain  Posterior right knee pain  Essential hypertension  Elevated blood pressure reading  Suspect inflammatory type pain  likely due to underlying arthritis, worsened by nature of her work, lack of hydration. Schedule APAP, Toradol injection in clinic today. Use tramadol for breakthrough pain. Counseled patient on potential for adverse effects with medications prescribed today, patient verbalized understanding. Return-to-clinic precautions discussed, patient verbalized understanding.    Jaynee Eagles, PA-C 10/29/17 1239

## 2017-11-05 ENCOUNTER — Ambulatory Visit (INDEPENDENT_AMBULATORY_CARE_PROVIDER_SITE_OTHER)
Admission: RE | Admit: 2017-11-05 | Discharge: 2017-11-05 | Disposition: A | Discharge status: Home / Self Care | Payer: BC Managed Care – PPO | Source: Ambulatory Visit | Attending: Family Medicine | Admitting: Family Medicine

## 2017-11-05 DIAGNOSIS — G8929 Other chronic pain: Secondary | ICD-10-CM | POA: Diagnosis not present

## 2017-11-05 DIAGNOSIS — M25561 Pain in right knee: Secondary | ICD-10-CM

## 2017-11-08 ENCOUNTER — Encounter: Payer: Self-pay | Admitting: Family Medicine

## 2017-11-08 ENCOUNTER — Ambulatory Visit: Payer: BC Managed Care – PPO | Admitting: Family Medicine

## 2017-11-08 ENCOUNTER — Encounter: Payer: Self-pay | Admitting: Emergency Medicine

## 2017-11-08 VITALS — BP 112/80 | HR 81 | Temp 98.2°F | Wt 203.7 lb

## 2017-11-08 DIAGNOSIS — M1990 Unspecified osteoarthritis, unspecified site: Secondary | ICD-10-CM | POA: Diagnosis not present

## 2017-11-08 MED ORDER — DICLOFENAC SODIUM 1 % TD GEL: 2.0000 g | Freq: Three times a day (TID) | TRANSDERMAL | 2 refills | Status: DC | PRN

## 2017-11-08 NOTE — Patient Instructions (Addendum)

## 2017-11-08 NOTE — Progress Notes (Signed)
Subjective:    Patient ID: Kristi Meyers, female    DOB: 30-Oct-1955, 62 y.o.   MRN: [id number]  No chief complaint on file.   HPI Patient was seen today for f/u on R knee pain.  Pt was seen on 07/26/17 for R knee pain.  At that time pt was advised to have xrays done.  Pt just recently had xrays done on 11/05/17 showing moderate tricompartmental arthritis.  Pt was also seen on 10/29/17 for r leg pain 2/2 arthritis.  Pt was given a Toradol injection, told to schedule tylenol and given tramadol for breakthrough pain.  Pt states no of the medicines helped.  Pt endorses being "snappy" at people 2/2 being in pain.  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  The cold weather is also making pt's knee pain worse.  Past Medical History:  Diagnosis Date  . GERD (gastroesophageal reflux disease)   . High cholesterol   . Hypertension   . Vertigo     No Known Allergies  ROS General: Denies fever, chills, night sweats, changes in weight, changes in appetite HEENT: Denies headaches, ear pain, changes in vision, rhinorrhea, sore throat CV: Denies CP, palpitations, SOB, orthopnea Pulm: Denies SOB, cough, wheezing GI: Denies abdominal pain, nausea, vomiting, diarrhea, constipation GU: Denies dysuria, hematuria, frequency, vaginal discharge Msk: Denies muscle cramps    +R knee pain Neuro: Denies weakness, numbness, tingling Skin: Denies rashes, bruising Psych: Denies depression, anxiety, hallucinations     Objective:    Blood pressure 112/80, pulse 81, temperature 98.2 F (36.8 C), temperature source Oral, weight 203 lb 11.2 oz (92.4 kg), last menstrual period 11/17/2008.   Gen. Pleasant, well-nourished, in no distress, normal affect   HEENT: [place]/AT, face symmetric, conjunctiva clear, no scleral icterus, PERRLA, nares patent without drainage Lungs: no accessory muscle use, CTAB Cardiovascular: RRR, no peripheral edema Musculoskeletal: L leg > R leg.   No erythema or edema of R knee.  No crepitus of R knee or effusion, normal patellar tracking, no TTP of medial and lateral joint line, TTP of posterior aspect of joint and tibia just inferior to patella. no cyanosis or clubbing, normal tone Neuro:  A&Ox3, CN II-XII intact, normal gait   Wt Readings from Last 3 Encounters:  11/08/17 203 lb 11.2 oz (92.4 kg)  07/26/17 208 lb 5 oz (94.5 kg)    Lab Results  Component Value Date   HGB 13.6 10/29/2017   HCT 40.0 10/29/2017   GLUCOSE 124 (H) 10/29/2017   NA 145 10/29/2017   K 3.9 10/29/2017   CL 106 10/29/2017   CREATININE 0.90 10/29/2017   BUN 12 10/29/2017    Assessment/Plan:  Arthritis  -Discussed xray results.  Moderate tricompartmental arthritis -pt to try voltaren gel -if gel does not help will proceed with steroid injections. - Plan: diclofenac sodium (VOLTAREN) 1 % GEL -pt to f/u next wk if no improvement with the gel.  Grier Mitts, MD

## 2017-11-16 DIAGNOSIS — I82402 Acute embolism and thrombosis of unspecified deep veins of left lower extremity: Secondary | ICD-10-CM

## 2017-11-16 HISTORY — DX: Acute embolism and thrombosis of unspecified deep veins of left lower extremity: I82.402

## 2017-12-09 ENCOUNTER — Emergency Department (HOSPITAL_COMMUNITY): Payer: BC Managed Care – PPO

## 2017-12-09 ENCOUNTER — Encounter (HOSPITAL_COMMUNITY): Payer: Self-pay

## 2017-12-09 ENCOUNTER — Inpatient Hospital Stay (HOSPITAL_COMMUNITY)
Admission: EM | Admit: 2017-12-09 | Discharge: 2017-12-15 | DRG: 175 | Disposition: A | Discharge status: Skilled Nursing Facility | Payer: BC Managed Care – PPO | Attending: Internal Medicine | Admitting: Internal Medicine

## 2017-12-09 DIAGNOSIS — J9601 Acute respiratory failure with hypoxia: Secondary | ICD-10-CM | POA: Diagnosis present

## 2017-12-09 DIAGNOSIS — K5900 Constipation, unspecified: Secondary | ICD-10-CM | POA: Diagnosis present

## 2017-12-09 DIAGNOSIS — K219 Gastro-esophageal reflux disease without esophagitis: Secondary | ICD-10-CM | POA: Diagnosis present

## 2017-12-09 DIAGNOSIS — I2602 Saddle embolus of pulmonary artery with acute cor pulmonale: Secondary | ICD-10-CM

## 2017-12-09 DIAGNOSIS — I2692 Saddle embolus of pulmonary artery without acute cor pulmonale: Secondary | ICD-10-CM | POA: Diagnosis present

## 2017-12-09 DIAGNOSIS — R0603 Acute respiratory distress: Secondary | ICD-10-CM

## 2017-12-09 DIAGNOSIS — R739 Hyperglycemia, unspecified: Secondary | ICD-10-CM | POA: Diagnosis present

## 2017-12-09 DIAGNOSIS — E876 Hypokalemia: Secondary | ICD-10-CM | POA: Diagnosis not present

## 2017-12-09 DIAGNOSIS — I2699 Other pulmonary embolism without acute cor pulmonale: Secondary | ICD-10-CM

## 2017-12-09 DIAGNOSIS — R0602 Shortness of breath: Secondary | ICD-10-CM | POA: Diagnosis not present

## 2017-12-09 DIAGNOSIS — Z79899 Other long term (current) drug therapy: Secondary | ICD-10-CM

## 2017-12-09 DIAGNOSIS — I248 Other forms of acute ischemic heart disease: Secondary | ICD-10-CM | POA: Diagnosis present

## 2017-12-09 DIAGNOSIS — I82890 Acute embolism and thrombosis of other specified veins: Secondary | ICD-10-CM | POA: Diagnosis present

## 2017-12-09 DIAGNOSIS — N390 Urinary tract infection, site not specified: Secondary | ICD-10-CM

## 2017-12-09 DIAGNOSIS — R579 Shock, unspecified: Secondary | ICD-10-CM

## 2017-12-09 DIAGNOSIS — R578 Other shock: Secondary | ICD-10-CM | POA: Diagnosis present

## 2017-12-09 DIAGNOSIS — N179 Acute kidney failure, unspecified: Secondary | ICD-10-CM

## 2017-12-09 DIAGNOSIS — I82442 Acute embolism and thrombosis of left tibial vein: Secondary | ICD-10-CM | POA: Diagnosis present

## 2017-12-09 DIAGNOSIS — F17210 Nicotine dependence, cigarettes, uncomplicated: Secondary | ICD-10-CM | POA: Diagnosis present

## 2017-12-09 DIAGNOSIS — B9620 Unspecified Escherichia coli [E. coli] as the cause of diseases classified elsewhere: Secondary | ICD-10-CM | POA: Diagnosis present

## 2017-12-09 DIAGNOSIS — E7800 Pure hypercholesterolemia, unspecified: Secondary | ICD-10-CM | POA: Diagnosis present

## 2017-12-09 DIAGNOSIS — R7989 Other specified abnormal findings of blood chemistry: Secondary | ICD-10-CM

## 2017-12-09 DIAGNOSIS — R778 Other specified abnormalities of plasma proteins: Secondary | ICD-10-CM

## 2017-12-09 DIAGNOSIS — I714 Abdominal aortic aneurysm, without rupture: Secondary | ICD-10-CM | POA: Diagnosis present

## 2017-12-09 DIAGNOSIS — I82412 Acute embolism and thrombosis of left femoral vein: Secondary | ICD-10-CM | POA: Diagnosis present

## 2017-12-09 DIAGNOSIS — I82432 Acute embolism and thrombosis of left popliteal vein: Secondary | ICD-10-CM | POA: Diagnosis present

## 2017-12-09 DIAGNOSIS — Z9049 Acquired absence of other specified parts of digestive tract: Secondary | ICD-10-CM

## 2017-12-09 DIAGNOSIS — R040 Epistaxis: Secondary | ICD-10-CM | POA: Diagnosis not present

## 2017-12-09 DIAGNOSIS — I10 Essential (primary) hypertension: Secondary | ICD-10-CM | POA: Diagnosis present

## 2017-12-09 LAB — COMPREHENSIVE METABOLIC PANEL
ALT: 16 U/L (ref 14–54)
AST: 15 U/L (ref 15–41)
Albumin: 2.9 g/dL — ABNORMAL LOW (ref 3.5–5.0)
Alkaline Phosphatase: 71 U/L (ref 38–126)
Anion gap: 9 (ref 5–15)
BUN: 22 mg/dL — ABNORMAL HIGH (ref 6–20)
CO2: 22 mmol/L (ref 22–32)
Calcium: 8.3 mg/dL — ABNORMAL LOW (ref 8.9–10.3)
Chloride: 110 mmol/L (ref 101–111)
Creatinine, Ser: 1.24 mg/dL — ABNORMAL HIGH (ref 0.44–1.00)
GFR calc Af Amer: 52 mL/min — ABNORMAL LOW (ref >60)
GFR calc non Af Amer: 45 mL/min — ABNORMAL LOW (ref >60)
Glucose, Bld: 130 mg/dL — ABNORMAL HIGH (ref 65–99)
Potassium: 4.1 mmol/L (ref 3.5–5.1)
Sodium: 141 mmol/L (ref 135–145)
Total Bilirubin: 0.8 mg/dL (ref 0.3–1.2)
Total Protein: 6.4 g/dL — ABNORMAL LOW (ref 6.5–8.1)

## 2017-12-09 LAB — I-STAT TROPONIN, ED
Troponin i, poc: 0.41 ng/mL (ref 0.00–0.08)
Troponin i, poc: 0.36 ng/mL (ref 0.00–0.08)

## 2017-12-09 LAB — CBC WITH DIFFERENTIAL/PLATELET
Basophils Absolute: 0 10*3/uL (ref 0.0–0.1)
Basophils Relative: 0 %
Eosinophils Absolute: 0 10*3/uL (ref 0.0–0.7)
Eosinophils Relative: 0 %
HCT: 33.6 % — ABNORMAL LOW (ref 36.0–46.0)
Hemoglobin: 11 g/dL — ABNORMAL LOW (ref 12.0–15.0)
Lymphocytes Relative: 15 %
Lymphs Abs: 2 10*3/uL (ref 0.7–4.0)
MCH: 27.7 pg (ref 26.0–34.0)
MCHC: 32.7 g/dL (ref 30.0–36.0)
MCV: 84.6 fL (ref 78.0–100.0)
Monocytes Absolute: 1 10*3/uL (ref 0.1–1.0)
Monocytes Relative: 8 %
Neutro Abs: 9.9 10*3/uL — ABNORMAL HIGH (ref 1.7–7.7)
Neutrophils Relative %: 77 %
Platelets: 199 10*3/uL (ref 150–400)
RBC: 3.97 MIL/uL (ref 3.87–5.11)
RDW: 14.7 % (ref 11.5–15.5)
WBC: 13 10*3/uL — ABNORMAL HIGH (ref 4.0–10.5)

## 2017-12-09 LAB — URINALYSIS, ROUTINE W REFLEX MICROSCOPIC
Bilirubin Urine: NEGATIVE
Glucose, UA: NEGATIVE mg/dL
Hgb urine dipstick: NEGATIVE
Ketones, ur: 5 mg/dL — AB
Leukocytes, UA: NEGATIVE
Nitrite: NEGATIVE
Protein, ur: 100 mg/dL — AB
Specific Gravity, Urine: 1.021 (ref 1.005–1.030)
pH: 5 (ref 5.0–8.0)

## 2017-12-09 LAB — PROTIME-INR
INR: 1.18
Prothrombin Time: 14.9 seconds (ref 11.4–15.2)

## 2017-12-09 LAB — LIPASE, BLOOD: Lipase: 20 U/L (ref 11–51)

## 2017-12-09 LAB — I-STAT CG4 LACTIC ACID, ED
Lactic Acid, Venous: 1.43 mmol/L (ref 0.5–1.9)
Lactic Acid, Venous: 1.25 mmol/L (ref 0.5–1.9)

## 2017-12-09 LAB — MAGNESIUM: Magnesium: 2 mg/dL (ref 1.7–2.4)

## 2017-12-09 LAB — TSH: TSH: 1.187 u[IU]/mL (ref 0.350–4.500)

## 2017-12-09 MED ORDER — SODIUM CHLORIDE 0.9 % IV BOLUS (SEPSIS)
1000.0000 mL | Freq: Once | INTRAVENOUS | Status: AC
  Administered 2017-12-09: 1000 mL via INTRAVENOUS

## 2017-12-09 MED ORDER — ACETAMINOPHEN 325 MG PO TABS
650.0000 mg | ORAL_TABLET | Freq: Once | ORAL | Status: AC
  Administered 2017-12-09: 650 mg via ORAL
  Filled 2017-12-09: qty 2

## 2017-12-09 NOTE — ED Provider Notes (Signed)
[facility] DEPT Provider Note   CSN: [id number] Arrival date & time: 12/09/17  1712     History   Chief Complaint No chief complaint on file.   HPI Kristi Meyers is a 62 y.o. female.  The history is provided by the patient, a friend and medical records.  Shortness of Breath  This is a new problem. The average episode lasts 1 week. The problem occurs continuously.The current episode started more than 2 days ago. The problem has been rapidly worsening. Associated symptoms include a fever, cough and vomiting. Pertinent negatives include no headaches, no rhinorrhea, no neck pain, no wheezing, no chest pain, no syncope, no abdominal pain and no rash. She has tried nothing for the symptoms. The treatment provided no relief. Associated medical issues do not include CAD, DVT or recent surgery.    Past Medical History:  Diagnosis Date  . GERD (gastroesophageal reflux disease)   . High cholesterol   . Hypertension   . Vertigo     There are no active problems to display for this patient.   Past Surgical History:  Procedure Laterality Date  . CHOLECYSTECTOMY      OB History    No data available       Home Medications    Prior to Admission medications   Medication Sig Start Date End Date Taking? Authorizing Provider  atorvastatin (LIPITOR) 40 MG tablet Take 40 mg by mouth daily.    [provider]  carvedilol (COREG) 25 MG tablet Take 25 mg by mouth 2 (two) times daily with a meal.    [provider]  diclofenac sodium (VOLTAREN) 1 % GEL Apply 2 g topically 3 (three) times daily as needed. 11/08/17   Billie Ruddy, MD  meclizine (ANTIVERT) 12.5 MG tablet Take 12.5 mg by mouth 2 (two) times daily.    [provider]  Olmesartan-Amlodipine-HCTZ 40-10-25 MG TABS Take 1 tablet by mouth daily.    [provider]  potassium chloride SA (K-DUR,KLOR-CON) 20 MEQ tablet Take 20 mEq by mouth daily.    [provider]  traMADol (ULTRAM) 50 MG tablet Take 1 tablet (50 mg total) by mouth every 8 (eight) hours as needed for severe pain. 10/29/17   Jaynee Eagles, PA-C    Family History No family history on file.  Social History Social History   Tobacco Use  . Smoking status: Current Some Day Smoker    Types: Cigarettes  . Smokeless tobacco: Never Used  Substance Use Topics  . Alcohol use: No  . Drug use: No     Allergies   Patient has no known allergies.   Review of Systems Review of Systems  Constitutional: Positive for chills, fatigue and fever. Negative for diaphoresis.  HENT: Positive for congestion. Negative for rhinorrhea.   Eyes: Negative for visual disturbance.  Respiratory: Positive for cough, chest tightness and shortness of breath. Negative for wheezing and stridor.   Cardiovascular: Negative for chest pain, palpitations and syncope.  Gastrointestinal: Positive for nausea and vomiting. Negative for abdominal pain, constipation and diarrhea.  Genitourinary: Negative for dysuria.  Musculoskeletal: Negative for back pain and neck pain.  Skin: Negative for rash and wound.  Neurological: Positive for light-headedness. Negative for weakness and headaches.  Psychiatric/Behavioral: Negative for agitation.  All other systems reviewed and are negative.    Physical Exam Updated Vital Signs BP (!) 78/54   Resp 13   LMP 11/17/2008 (Approximate)   Physical Exam  Constitutional: She  is oriented to person, place, and time. She appears well-developed and well-nourished. No distress.  HENT:  Head: Normocephalic.  Mouth/Throat: Oropharynx is clear and moist. No oropharyngeal exudate.  Eyes: Conjunctivae and EOM are normal. Pupils are equal, round, and reactive to light.  Neck: Normal range of motion.  Cardiovascular: Normal rate and intact distal pulses.  No murmur heard. Pulmonary/Chest: No stridor. Tachypnea noted. No respiratory distress. She has no wheezes. She has  rhonchi. She exhibits no tenderness.  Abdominal: Soft. Bowel sounds are normal. She exhibits no distension. There is no tenderness.  Musculoskeletal: She exhibits no edema or tenderness.  Neurological: She is alert and oriented to person, place, and time. No cranial nerve deficit or sensory deficit. She exhibits normal muscle tone.  Skin: Capillary refill takes less than 2 seconds. No rash noted. She is not diaphoretic. No erythema.  Psychiatric: She has a normal mood and affect.  Nursing note and vitals reviewed.    ED Treatments / Results  Labs (all labs ordered are listed, but only abnormal results are displayed) Labs Reviewed  CBC WITH DIFFERENTIAL/PLATELET - Abnormal; Notable for the following components:      Result Value   WBC 13.0 (*)    Hemoglobin 11.0 (*)    HCT 33.6 (*)    Neutro Abs 9.9 (*)    All other components within normal limits  COMPREHENSIVE METABOLIC PANEL - Abnormal; Notable for the following components:   Glucose, Bld 130 (*)    BUN 22 (*)    Creatinine, Ser 1.24 (*)    Calcium 8.3 (*)    Total Protein 6.4 (*)    Albumin 2.9 (*)    GFR calc non Af Amer 45 (*)    GFR calc Af Amer 52 (*)    All other components within normal limits  URINALYSIS, ROUTINE W REFLEX MICROSCOPIC - Abnormal; Notable for the following components:   Color, Urine AMBER (*)    APPearance CLOUDY (*)    Ketones, ur 5 (*)    Protein, ur 100 (*)    Bacteria, UA MANY (*)    Squamous Epithelial / LPF 0-5 (*)    All other components within normal limits  I-STAT TROPONIN, ED - Abnormal; Notable for the following components:   Troponin i, poc 0.41 (*)    All other components within normal limits  I-STAT TROPONIN, ED - Abnormal; Notable for the following components:   Troponin i, poc 0.36 (*)    All other components within normal limits  URINE CULTURE  CULTURE, BLOOD (ROUTINE X 2)  CULTURE, BLOOD (ROUTINE X 2)  LIPASE, BLOOD  PROTIME-INR  TSH  MAGNESIUM  APTT  HEPARIN LEVEL  (UNFRACTIONATED)  CBC  HIV ANTIBODY (ROUTINE TESTING)  CBC  BASIC METABOLIC PANEL  MAGNESIUM  PHOSPHORUS  I-STAT CG4 LACTIC ACID, ED  I-STAT CG4 LACTIC ACID, ED    EKG  EKG Interpretation  Date/Time:  Sunday December 09 2017 19:03:18 EST Ventricular Rate:  85 PR Interval:    QRS Duration: 99 QT Interval:  397 QTC Calculation: 473 R Axis:   -53 Text Interpretation:  Sinus rhythm Left anterior fascicular block Probable left ventricular hypertrophy Anterior Q waves, possibly due to LVH Nonspecific T abnormalities, lateral leads When compared to prior, t wave now upright in leads 3, AVL, V4, and V5 improved from prior. T waves now inverted in lead3.  No STEMI Confirmed by Antony Blackbird [phone]) on 12/09/2017 7:30:12 PM       Radiology Dg Chest  2 View  Result Date: 12/09/2017 CLINICAL DATA:  Hypoxia and cough EXAM: CHEST  2 VIEW COMPARISON:  Chest radiograph 01/18/2005 FINDINGS: The heart size and mediastinal contours are within normal limits. Both lungs are clear. The visualized skeletal structures are unremarkable. IMPRESSION: No active cardiopulmonary disease. Electronically Signed   By: Ulyses Jarred M.D.   On: 12/09/2017 19:13   Ct Angio Chest Pe W And/or Wo Contrast  Addendum Date: 12/10/2017   ADDENDUM REPORT: 12/10/2017 01:11 ADDENDUM: Positive for acute PE with CT evidence of right heart strain (RV/LV Ratio = 1.74) consistent with at least submassive (intermediate risk) PE. The presence of right heart strain has been associated with an increased risk of morbidity and mortality. Please activate Code PE by paging [phone]. Electronically Signed   By: Anner Crete M.D.   On: 12/10/2017 01:11   Addendum Date: 12/10/2017   ADDENDUM REPORT: 12/10/2017 01:05 ADDENDUM: Positive for acute PE with CTevidence of right heart strain (RV/LV Ratio = 1.74) consistent with at least submassive (intermediate risk) PE. The presence of right heart strain has been associated with an increased  risk of morbidity and mortality. Electronically Signed   By: Anner Crete M.D.   On: 12/10/2017 01:05   Result Date: 12/10/2017 CLINICAL DATA:  62 year old female with shortness of breath. Generalized weakness and fatigue. EXAM: CT ANGIOGRAPHY CHEST WITH CONTRAST TECHNIQUE: Multidetector CT imaging of the chest was performed using the standard protocol during bolus administration of intravenous contrast. Multiplanar CT image reconstructions and MIPs were obtained to evaluate the vascular anatomy. CONTRAST:  100 cc Isovue 370 COMPARISON:  Chest radiograph dated 12/09/2017 FINDINGS: Cardiovascular: There is no cardiomegaly or pericardial effusion. There is dilatation of the right ventricle with bowing of the intraventricular septum to the left. The right ventricle measures 5.4 cm in diameter and the left ventricle measures 3.1 cm in diameter. Findings consistent with right ventricular strain. There is moderate calcified and noncalcified plaque of the thoracic aorta. There is a large saddle pulmonary radiology embolus straddling the bifurcation of the main pulmonary trunk and extending into the bilateral pulmonary arteries. There is occlusion of the lobar and segmental arteries of the lower lobes. There is dilatation of the main pulmonary trunk and central pulmonary arteries. Mediastinum/Nodes: There is no hilar or mediastinal adenopathy. Esophagus and the thyroid gland are grossly unremarkable. No mediastinal fluid collection. Lungs/Pleura: There is a 3 cm subpleural bleb in the left lung base. The lungs are otherwise clear. There is no pleural effusion or pneumothorax. The central airways are patent. Upper Abdomen: There is filling defect within the visualized intrahepatic IVC (series 5, image 245). This area is only partially visualized and not well evaluated. Although this may be related to mixing artifact findings is concerning for IVC thrombus. Further evaluation with CT of the abdomen pelvis with IV  contrast and delayed images in the venous phase recommended. Cholecystectomy. Musculoskeletal: Degenerative changes of the spine. No acute osseous pathology. Review of the MIP images confirms the above findings. IMPRESSION: 1. Large occlusive saddle embolus with findings of right heart straining. 2. Partially visualized filling defect in the intrahepatic IVC concerning for thrombus. Further evaluation with CT of the abdomen and pelvis with IV contrast in venous phase recommended. These results were called by telephone at the time of interpretation on 12/10/2017 at 12:46 am to Dr. Marda Stalker , who verbally acknowledged these results. Electronically Signed: By: Anner Crete M.D. On: 12/10/2017 00:54    Procedures Procedures (including critical care time)  .  CRITICAL CARE Performed by: Gwenyth Allegra [employer] Total critical care time: 60 minutes Critical care time was exclusive of separately billable procedures and treating other patients. Critical care was necessary to treat or prevent imminent or life-threatening deterioration. Critical care was time spent personally by me on the following activities: development of treatment plan with patient and/or surrogate as well as nursing, discussions with consultants, evaluation of patient's response to treatment, examination of patient, obtaining history from patient or surrogate, ordering and performing treatments and interventions, ordering and review of laboratory studies, ordering and review of radiographic studies, pulse oximetry and re-evaluation of patient's condition.   Medications Ordered in ED Medications  heparin ADULT infusion 100 units/mL (25000 units/270mL sodium chloride 0.45%) (1,200 Units/hr Intravenous New Bag/Given 12/10/17 0133)  0.9 %  sodium chloride infusion (not administered)  famotidine (PEPCID) tablet 20 mg (not administered)  ondansetron (ZOFRAN) injection 4 mg (not administered)  acetaminophen (TYLENOL) tablet 650 mg  (not administered)  0.9 %  sodium chloride infusion ( Intravenous New Bag/Given 12/10/17 0228)  sodium chloride 0.9 % bolus 1,000 mL (0 mLs Intravenous Stopped 12/09/17 2055)  acetaminophen (TYLENOL) tablet 650 mg (650 mg Oral Given 12/09/17 2221)  iopamidol (ISOVUE-370) 76 % injection 100 mL (100 mLs Intravenous Contrast Given 12/10/17 0028)  sodium chloride 0.9 % bolus 1,000 mL (1,000 mLs Intravenous New Bag/Given 12/10/17 0054)  heparin bolus via infusion 5,000 Units (5,000 Units Intravenous Bolus from Bag 12/10/17 0128)     Initial Impression / Assessment and Plan / ED Course  I have reviewed the triage vital signs and the nursing notes.  Pertinent labs & imaging results that were available during my care of the patient were reviewed by me and considered in my medical decision making (see chart for details).     Kristi Meyers is a 62 y.o. female with a past medical history significant for GERD, hypertension, and hypercholesterolemia who presents with fever, chills, fatigue, lightheadedness, nausea, vomiting, nonproductive cough, decreased urine, diarrhea.  Patient is calmly by family reports the patient has slowly been worsening for the last month.  He reports the last week it worsened and then today was the worst it has been.  Patient reports that today she started having diarrhea that was nonbloody.  Patient reports feelings of fatigue.  She also had nausea and vomiting yesterday.  It was nonbloody emesis.  She reports a intermittent dry cough.  No production.  She says her urine has been darker and slightly decreased.  She denies dysuria.  She has not taken her temperature home but felt very warm subjectively.  She reports some lower abdominal cramping when she vomits.  She denies any chest pain, palpitations headaches, vision changes or any other neurologic deficits.  Next  On arrival, patient's blood pressure was found to be in the 70s.  Patient says that she totally has hypertension and  this is new for her.  She was afebrile by nursing report.  Patient also was placed on 2 L nasal cannula for reported hypoxia.  On my exam, patient had some coarse breath sounds bilaterally.  Abdomen was nontender.  Chest was nontender.  Patient had no significant edema in her legs.  No rashes seen.  Patient had no focal neurologic deficits.  Patient is alert and oriented.  Patient is very fatigued however.  Patient will workup to look for etiology of symptoms.  Given the urinary symptoms and cough as well as new oxygen requirement of 2 L.  Patient will have chest  x-ray urinalysis and lab testing.  Patient given fluids during initial evaluation and blood pressure has improved into the 90s.  Next  Anticipate admission for hypotension.  Patient was found to have elevation in her troponin of 0.41.  Repeat EKG showed no evidence of STEMI.  Patient had previously inverted T waves that had improved as well as some new inversions.  Delta troponin was improving to 0.36.  Patient's lactic acid was normal x2.  Mild leukocytosis of 13.  Similar anemia to prior.  But reassuring slight elevation in creatinine from prior liver function and lipase.  Urinalysis did not show convincing evidence of infection with no nitrites or leukocytes.  Chest x-ray shows no pneumonia however given the patient's cough and hypoxia, still concerned for possible pneumonia.  Given the patient's hypoxia, and her elevated troponin, patient will have PE study to rule out pulmonary embolism prior to admission.  I am still concerned about the patient's constellation of symptoms and do not feel patient is safe to go home with her elevated troponins however will await CT scan before admission.  12:58 AM I personally reviewed the CT scan while the patient was still in the CT scanner and discovered evidence of saddle pulmonary embolism.  Radiology called to confirm my suspicion of saddle pulmonary embolism with the addition of right heart strain.    Critical care/pulmonology was called and they  will come evaluate the patient as she will likely need ICU level care.  The agreed with heparinization and they will likely call interventional radiology to discuss catheter directed TPA given her hypotension and troponin.  Radiology called to report that they think there may be clot in the IVC.  As the patient may be getting catheter directed therapy, will defer CT of the abdomen and pelvis imaging decision to critical care and interventional radiology for further management.    Final Clinical Impressions(s) / ED Diagnoses   Final diagnoses:  Acute saddle pulmonary embolism with acute cor pulmonale (HCC)    Clinical Impression: 1. Acute saddle pulmonary embolism with acute cor pulmonale (HCC)     Disposition: Admit  This note was prepared with assistance of Dragon voice recognition software. Occasional wrong-word or sound-a-like substitutions may have occurred due to the inherent limitations of voice recognition software.     [place], Gwenyth Allegra, MD 12/10/17 [phone]

## 2017-12-09 NOTE — ED Triage Notes (Signed)
Patient coming from home with c/o hypotension with pressure of 70/42 palpated HR 100. Pt c/o generalized weakness and fatigue. Pt given 500 ml of normal saline and Zofran mg. Current pressure 130/70 HR 80.

## 2017-12-09 NOTE — ED Notes (Signed)
Patient informed a urine sample is needed. 

## 2017-12-10 ENCOUNTER — Encounter (HOSPITAL_COMMUNITY): Payer: Self-pay

## 2017-12-10 ENCOUNTER — Inpatient Hospital Stay (HOSPITAL_COMMUNITY): Payer: BC Managed Care – PPO

## 2017-12-10 ENCOUNTER — Emergency Department (HOSPITAL_COMMUNITY): Payer: BC Managed Care – PPO

## 2017-12-10 DIAGNOSIS — R0603 Acute respiratory distress: Secondary | ICD-10-CM | POA: Diagnosis not present

## 2017-12-10 DIAGNOSIS — R000 Tachycardia, unspecified: Secondary | ICD-10-CM | POA: Diagnosis not present

## 2017-12-10 DIAGNOSIS — N179 Acute kidney failure, unspecified: Secondary | ICD-10-CM

## 2017-12-10 DIAGNOSIS — J9601 Acute respiratory failure with hypoxia: Secondary | ICD-10-CM | POA: Diagnosis not present

## 2017-12-10 DIAGNOSIS — I2692 Saddle embolus of pulmonary artery without acute cor pulmonale: Secondary | ICD-10-CM | POA: Diagnosis present

## 2017-12-10 DIAGNOSIS — R578 Other shock: Secondary | ICD-10-CM | POA: Diagnosis present

## 2017-12-10 DIAGNOSIS — K5900 Constipation, unspecified: Secondary | ICD-10-CM | POA: Diagnosis present

## 2017-12-10 DIAGNOSIS — I2602 Saddle embolus of pulmonary artery with acute cor pulmonale: Secondary | ICD-10-CM

## 2017-12-10 DIAGNOSIS — F17210 Nicotine dependence, cigarettes, uncomplicated: Secondary | ICD-10-CM | POA: Diagnosis present

## 2017-12-10 DIAGNOSIS — K219 Gastro-esophageal reflux disease without esophagitis: Secondary | ICD-10-CM | POA: Diagnosis present

## 2017-12-10 DIAGNOSIS — I82412 Acute embolism and thrombosis of left femoral vein: Secondary | ICD-10-CM | POA: Diagnosis present

## 2017-12-10 DIAGNOSIS — I824Y2 Acute embolism and thrombosis of unspecified deep veins of left proximal lower extremity: Secondary | ICD-10-CM | POA: Diagnosis not present

## 2017-12-10 DIAGNOSIS — R0602 Shortness of breath: Secondary | ICD-10-CM | POA: Diagnosis present

## 2017-12-10 DIAGNOSIS — Z79899 Other long term (current) drug therapy: Secondary | ICD-10-CM | POA: Diagnosis not present

## 2017-12-10 DIAGNOSIS — M7989 Other specified soft tissue disorders: Secondary | ICD-10-CM

## 2017-12-10 DIAGNOSIS — R0902 Hypoxemia: Secondary | ICD-10-CM | POA: Diagnosis not present

## 2017-12-10 DIAGNOSIS — R579 Shock, unspecified: Secondary | ICD-10-CM

## 2017-12-10 DIAGNOSIS — I82442 Acute embolism and thrombosis of left tibial vein: Secondary | ICD-10-CM | POA: Diagnosis present

## 2017-12-10 DIAGNOSIS — Z9049 Acquired absence of other specified parts of digestive tract: Secondary | ICD-10-CM | POA: Diagnosis not present

## 2017-12-10 DIAGNOSIS — E876 Hypokalemia: Secondary | ICD-10-CM | POA: Diagnosis not present

## 2017-12-10 DIAGNOSIS — R040 Epistaxis: Secondary | ICD-10-CM | POA: Diagnosis not present

## 2017-12-10 DIAGNOSIS — I82890 Acute embolism and thrombosis of other specified veins: Secondary | ICD-10-CM | POA: Diagnosis present

## 2017-12-10 DIAGNOSIS — E7800 Pure hypercholesterolemia, unspecified: Secondary | ICD-10-CM | POA: Diagnosis present

## 2017-12-10 DIAGNOSIS — I714 Abdominal aortic aneurysm, without rupture: Secondary | ICD-10-CM | POA: Diagnosis present

## 2017-12-10 DIAGNOSIS — I248 Other forms of acute ischemic heart disease: Secondary | ICD-10-CM | POA: Diagnosis present

## 2017-12-10 DIAGNOSIS — I82432 Acute embolism and thrombosis of left popliteal vein: Secondary | ICD-10-CM | POA: Diagnosis present

## 2017-12-10 DIAGNOSIS — I5030 Unspecified diastolic (congestive) heart failure: Secondary | ICD-10-CM | POA: Diagnosis not present

## 2017-12-10 DIAGNOSIS — R739 Hyperglycemia, unspecified: Secondary | ICD-10-CM | POA: Diagnosis present

## 2017-12-10 DIAGNOSIS — N390 Urinary tract infection, site not specified: Secondary | ICD-10-CM | POA: Diagnosis present

## 2017-12-10 DIAGNOSIS — B9620 Unspecified Escherichia coli [E. coli] as the cause of diseases classified elsewhere: Secondary | ICD-10-CM | POA: Diagnosis present

## 2017-12-10 DIAGNOSIS — I10 Essential (primary) hypertension: Secondary | ICD-10-CM | POA: Diagnosis present

## 2017-12-10 HISTORY — DX: Saddle embolus of pulmonary artery without acute cor pulmonale: I26.92

## 2017-12-10 HISTORY — PX: IR US GUIDE VASC ACCESS RIGHT: IMG2390

## 2017-12-10 HISTORY — PX: IR ANGIOGRAM SELECTIVE EACH ADDITIONAL VESSEL: IMG667

## 2017-12-10 HISTORY — PX: IR ANGIOGRAM PULMONARY BILATERAL SELECTIVE: IMG664

## 2017-12-10 HISTORY — PX: IR INFUSION THROMBOL ARTERIAL INITIAL (MS): IMG5376

## 2017-12-10 LAB — GLUCOSE, CAPILLARY
Glucose-Capillary: 85 mg/dL (ref 65–99)
Glucose-Capillary: 73 mg/dL (ref 65–99)

## 2017-12-10 LAB — CBC
HCT: 30.2 % — ABNORMAL LOW (ref 36.0–46.0)
HCT: 29.5 % — ABNORMAL LOW (ref 36.0–46.0)
HCT: 29.2 % — ABNORMAL LOW (ref 36.0–46.0)
Hemoglobin: 9.6 g/dL — ABNORMAL LOW (ref 12.0–15.0)
Hemoglobin: 9.4 g/dL — ABNORMAL LOW (ref 12.0–15.0)
Hemoglobin: 9.3 g/dL — ABNORMAL LOW (ref 12.0–15.0)
MCH: 26.9 pg (ref 26.0–34.0)
MCH: 27 pg (ref 26.0–34.0)
MCH: 27 pg (ref 26.0–34.0)
MCHC: 31.8 g/dL (ref 30.0–36.0)
MCHC: 31.9 g/dL (ref 30.0–36.0)
MCHC: 31.8 g/dL (ref 30.0–36.0)
MCV: 84.6 fL (ref 78.0–100.0)
MCV: 84.8 fL (ref 78.0–100.0)
MCV: 84.6 fL (ref 78.0–100.0)
Platelets: 157 10*3/uL (ref 150–400)
Platelets: 165 10*3/uL (ref 150–400)
Platelets: 161 10*3/uL (ref 150–400)
RBC: 3.57 MIL/uL — ABNORMAL LOW (ref 3.87–5.11)
RBC: 3.48 MIL/uL — ABNORMAL LOW (ref 3.87–5.11)
RBC: 3.45 MIL/uL — ABNORMAL LOW (ref 3.87–5.11)
RDW: 14.8 % (ref 11.5–15.5)
RDW: 15 % (ref 11.5–15.5)
RDW: 14.9 % (ref 11.5–15.5)
WBC: 11.2 10*3/uL — ABNORMAL HIGH (ref 4.0–10.5)
WBC: 10.6 10*3/uL — ABNORMAL HIGH (ref 4.0–10.5)
WBC: 11.7 10*3/uL — ABNORMAL HIGH (ref 4.0–10.5)

## 2017-12-10 LAB — FIBRINOGEN
Fibrinogen: 397 mg/dL (ref 210–475)
Fibrinogen: 379 mg/dL (ref 210–475)
Fibrinogen: 372 mg/dL (ref 210–475)

## 2017-12-10 LAB — ECHOCARDIOGRAM COMPLETE
Height: 62 in
Weight: 3185.21 oz

## 2017-12-10 LAB — HEPARIN LEVEL (UNFRACTIONATED)
Heparin Unfractionated: 0.96 IU/mL — ABNORMAL HIGH (ref 0.30–0.70)
Heparin Unfractionated: 0.73 IU/mL — ABNORMAL HIGH (ref 0.30–0.70)
Heparin Unfractionated: 0.55 IU/mL (ref 0.30–0.70)
Heparin Unfractionated: 0.39 IU/mL (ref 0.30–0.70)

## 2017-12-10 LAB — MRSA PCR SCREENING: MRSA by PCR: NEGATIVE

## 2017-12-10 MED ORDER — ALTEPLASE 50 MG IV SOLR
12.0000 mg | Freq: Once | INTRAVENOUS | Status: AC
  Administered 2017-12-10: 12 mg via INTRAVENOUS
  Filled 2017-12-10: qty 12

## 2017-12-10 MED ORDER — FENTANYL CITRATE (PF) 100 MCG/2ML IJ SOLN
INTRAMUSCULAR | Status: AC | PRN
  Administered 2017-12-10: 50 ug via INTRAVENOUS

## 2017-12-10 MED ORDER — IOPAMIDOL (ISOVUE-370) INJECTION 76%
100.0000 mL | Freq: Once | INTRAVENOUS | Status: AC | PRN
  Administered 2017-12-10: 100 mL via INTRAVENOUS

## 2017-12-10 MED ORDER — SODIUM CHLORIDE 0.9 % IV SOLN: 250.0000 mL | INTRAVENOUS | Status: DC | PRN

## 2017-12-10 MED ORDER — SODIUM CHLORIDE 0.9 % IV SOLN
INTRAVENOUS | Status: DC
  Administered 2017-12-10: 22:00:00 via INTRAVENOUS

## 2017-12-10 MED ORDER — IOPAMIDOL (ISOVUE-300) INJECTION 61%
INTRAVENOUS | Status: AC
  Administered 2017-12-10: 100 mL
  Filled 2017-12-10: qty 100

## 2017-12-10 MED ORDER — LIDOCAINE HCL 1 % IJ SOLN
INTRAMUSCULAR | Status: AC | PRN
  Administered 2017-12-10: 20 mL

## 2017-12-10 MED ORDER — LIDOCAINE HCL 1 % IJ SOLN
INTRAMUSCULAR | Status: AC
  Filled 2017-12-10: qty 20

## 2017-12-10 MED ORDER — SODIUM CHLORIDE 0.9% FLUSH: 3.0000 mL | INTRAVENOUS | Status: DC | PRN

## 2017-12-10 MED ORDER — HEPARIN BOLUS VIA INFUSION
5000.0000 [IU] | Freq: Once | INTRAVENOUS | Status: AC
  Administered 2017-12-10: 5000 [IU] via INTRAVENOUS
  Filled 2017-12-10: qty 5000

## 2017-12-10 MED ORDER — IOPAMIDOL (ISOVUE-370) INJECTION 76%
INTRAVENOUS | Status: AC
  Administered 2017-12-10: 100 mL via INTRAVENOUS
  Filled 2017-12-10: qty 100

## 2017-12-10 MED ORDER — HEPARIN (PORCINE) IN NACL 100-0.45 UNIT/ML-% IJ SOLN
1000.0000 [IU]/h | INTRAMUSCULAR | Status: DC
  Administered 2017-12-10: 1200 [IU]/h via INTRAVENOUS
  Administered 2017-12-10: 1000 [IU]/h via INTRAVENOUS
  Filled 2017-12-10 (×3): qty 250

## 2017-12-10 MED ORDER — ONDANSETRON HCL 4 MG/2ML IJ SOLN: 4.0000 mg | Freq: Four times a day (QID) | INTRAMUSCULAR | Status: DC | PRN

## 2017-12-10 MED ORDER — FAMOTIDINE 20 MG PO TABS
20.0000 mg | ORAL_TABLET | Freq: Two times a day (BID) | ORAL | Status: DC
  Administered 2017-12-10 (×2): 20 mg via ORAL
  Filled 2017-12-10 (×3): qty 1

## 2017-12-10 MED ORDER — SODIUM CHLORIDE 0.9 % IV BOLUS (SEPSIS)
1000.0000 mL | Freq: Once | INTRAVENOUS | Status: AC
  Administered 2017-12-10: 1000 mL via INTRAVENOUS

## 2017-12-10 MED ORDER — SODIUM CHLORIDE 0.9 % IV SOLN
INTRAVENOUS | Status: DC
  Administered 2017-12-10 – 2017-12-11 (×3): via INTRAVENOUS

## 2017-12-10 MED ORDER — IOPAMIDOL (ISOVUE-300) INJECTION 61%
INTRAVENOUS | Status: AC
  Administered 2017-12-10: 15 mL
  Filled 2017-12-10: qty 50

## 2017-12-10 MED ORDER — FENTANYL CITRATE (PF) 100 MCG/2ML IJ SOLN
INTRAMUSCULAR | Status: AC
  Filled 2017-12-10: qty 4

## 2017-12-10 MED ORDER — MIDAZOLAM HCL 2 MG/2ML IJ SOLN
INTRAMUSCULAR | Status: AC | PRN
Start: 2017-12-10 — End: 2017-12-10
  Administered 2017-12-10 (×2): 1 mg via INTRAVENOUS

## 2017-12-10 MED ORDER — MIDAZOLAM HCL 2 MG/2ML IJ SOLN
INTRAMUSCULAR | Status: AC
  Filled 2017-12-10: qty 4

## 2017-12-10 MED ORDER — ACETAMINOPHEN 325 MG PO TABS
650.0000 mg | ORAL_TABLET | ORAL | Status: DC | PRN
  Administered 2017-12-10 – 2017-12-15 (×18): 650 mg via ORAL
  Filled 2017-12-10 (×18): qty 2

## 2017-12-10 MED ORDER — SODIUM CHLORIDE 0.9 % IV SOLN
INTRAVENOUS | Status: DC
  Administered 2017-12-10: 10:00:00 via INTRAVENOUS

## 2017-12-10 MED ORDER — SODIUM CHLORIDE 0.9% FLUSH
3.0000 mL | Freq: Two times a day (BID) | INTRAVENOUS | Status: DC
  Administered 2017-12-10 – 2017-12-12 (×4): 3 mL via INTRAVENOUS

## 2017-12-10 MED ORDER — FENTANYL CITRATE (PF) 100 MCG/2ML IJ SOLN
25.0000 ug | Freq: Once | INTRAMUSCULAR | Status: AC
  Administered 2017-12-10: 25 ug via INTRAVENOUS
  Filled 2017-12-10: qty 2

## 2017-12-10 NOTE — Sedation Documentation (Signed)
Patient is resting comfortably. 

## 2017-12-10 NOTE — Progress Notes (Signed)
LE venous duplex prelim: DVT noted in the left femoral, popliteal, posterior tibial, and peroneal veins. No DVT RLE. Pedal pulses WNL documented with Doppler. Landry Mellow, RDMS, RVT

## 2017-12-10 NOTE — H&P (Signed)
PULMONARY / CRITICAL CARE MEDICINE   Name: Kristi Meyers MRN: [id number] DOB: [birth year]    ADMISSION DATE:  12/09/2017 CONSULTATION DATE: 12/09/17  REFERRING MD: Dr Tegeler (ER)  CHIEF COMPLAINT: SOB, Shock  HISTORY OF PRESENT ILLNESS:   62yoF with history of HTN, HLD, and GERD, presents to the ER tonight c/o SOB, Dizzyness, and Generalized weakness x 2 days. She also c/o constipation. She denies Cough, CP, Fever, or LE edema. In the ER she was found to be in shock with BP 70/42, which has since improved to 104/77 after 2L IVF. She reports her SBP as an outpatient is typically in 170's. CTA revealed bilateral PE's in right and left main PA's as well as saddle embolus and DVT within the IVF at the hepatic level. She was started on a Heparin gtt.   She says that her Right knee has been hurting her lately, and she has been going to bed a little earlier at night because of it. 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 She has not had any recent surgeries (only surgery ever was cholecystectomy 9 years ago). Denies history of CVA or GIB or head trauma. Denies any recent travel (car or plane) or being bed bound. Denies any other family members having had DVT's or PE's. Denies any history of malignancy.   PAST MEDICAL HISTORY :  She  has a past medical history of GERD (gastroesophageal reflux disease), High cholesterol, Hypertension, and Vertigo.  PAST SURGICAL HISTORY: She  has a past surgical history that includes Cholecystectomy.  No Known Allergies  No current facility-administered medications on file prior to encounter.    Current Outpatient Medications on File Prior to Encounter  Medication Sig  . atorvastatin (LIPITOR) 40 MG tablet Take 40 mg by mouth daily.  . carvedilol (COREG) 25 MG tablet Take 25 mg by mouth 2 (two) times daily with a meal.  . meclizine (ANTIVERT) 12.5 MG tablet Take 12.5 mg by mouth 2 (two) times daily.  . Olmesartan-Amlodipine-HCTZ 40-10-25  MG TABS Take 1 tablet by mouth daily.  . potassium chloride SA (K-DUR,KLOR-CON) 20 MEQ tablet Take 20 mEq by mouth daily.  . traMADol (ULTRAM) 50 MG tablet Take 1 tablet (50 mg total) by mouth every 8 (eight) hours as needed for severe pain.  Marland Kitchen diclofenac sodium (VOLTAREN) 1 % GEL Apply 2 g topically 3 (three) times daily as needed. (Patient not taking: Reported on 12/10/2017)   FAMILY HISTORY:  Her has no family status information on file.   SOCIAL HISTORY: She  reports that she has been smoking cigarettes.  she has never used smokeless tobacco. She reports that she does not drink alcohol or use drugs.  REVIEW OF SYSTEMS:   Review of Systems  Constitutional: Positive for malaise/fatigue.  HENT: Negative.   Eyes: Negative.   Respiratory: Positive for shortness of breath.   Cardiovascular: Negative.   Gastrointestinal: Negative.   Genitourinary: Negative.   Musculoskeletal: Positive for joint pain.  Skin: Negative.   Neurological: Positive for dizziness and weakness.  Endo/Heme/Allergies: Negative.   Psychiatric/Behavioral: Negative.    SUBJECTIVE:  Lying on ICU stretcher on 2L O2, reports still feels SOB even at rest.   VITAL SIGNS: BP 103/74 (BP Location: Right Arm)   Pulse 79   Temp 99 F (37.2 C) (Rectal)   Resp 17   Wt 92.4 kg (203 lb 11.3 oz)   LMP 11/17/2008 (Approximate)   SpO2 100%   BMI 34.69 kg/m   HEMODYNAMICS:  soft BP; not on vasopressors  INTAKE / OUTPUT: No intake/output data recorded.  PHYSICAL EXAMINATION: General: Adult female, WDWN, in NAD Neuro: AAOx3, moving all extremities HEENT: OP clear, MM moist Cardiovascular: RRR no m/r/g Lungs: CTA b/l Abdomen: Soft NTND Musculoskeletal: Trace BLE edema; both legs appear roughly equal in size. Skin: no rashes   LABS:  BMET Recent Labs  Lab 12/09/17 1837  NA 141  K 4.1  CL 110  CO2 22  BUN 22*  CREATININE 1.24*  GLUCOSE 130*   Electrolytes Recent Labs  Lab 12/09/17 1837  CALCIUM 8.3*   MG 2.0   CBC Recent Labs  Lab 12/09/17 1837  WBC 13.0*  HGB 11.0*  HCT 33.6*  PLT 199   Coag's Recent Labs  Lab 12/09/17 1837  INR 1.18   Sepsis Markers Recent Labs  Lab 12/09/17 1845 12/09/17 2106  LATICACIDVEN 1.43 1.25   ABG No results for input(s): PHART, PCO2ART, PO2ART in the last 168 hours.  Liver Enzymes Recent Labs  Lab 12/09/17 1837  AST 15  ALT 16  ALKPHOS 71  BILITOT 0.8  ALBUMIN 2.9*   Cardiac Enzymes No results for input(s): TROPONINI, PROBNP in the last 168 hours.  Glucose No results for input(s): GLUCAP in the last 168 hours.  Imaging Dg Chest 2 View  Result Date: 12/09/2017 CLINICAL DATA:  Hypoxia and cough EXAM: CHEST  2 VIEW COMPARISON:  Chest radiograph 01/18/2005 FINDINGS: The heart size and mediastinal contours are within normal limits. Both lungs are clear. The visualized skeletal structures are unremarkable. IMPRESSION: No active cardiopulmonary disease. Electronically Signed   By: Ulyses Jarred M.D.   On: 12/09/2017 19:13   Ct Angio Chest Pe W And/or Wo Contrast  Addendum Date: 12/10/2017   ADDENDUM REPORT: 12/10/2017 01:11 ADDENDUM: Positive for acute PE with CT evidence of right heart strain (RV/LV Ratio = 1.74) consistent with at least submassive (intermediate risk) PE. The presence of right heart strain has been associated with an increased risk of morbidity and mortality. Please activate Code PE by paging [phone]. Electronically Signed   By: Anner Crete M.D.   On: 12/10/2017 01:11   Addendum Date: 12/10/2017   ADDENDUM REPORT: 12/10/2017 01:05 ADDENDUM: Positive for acute PE with CTevidence of right heart strain (RV/LV Ratio = 1.74) consistent with at least submassive (intermediate risk) PE. The presence of right heart strain has been associated with an increased risk of morbidity and mortality. Electronically Signed   By: Anner Crete M.D.   On: 12/10/2017 01:05   Result Date: 12/10/2017 CLINICAL DATA:  62 year old  female with shortness of breath. Generalized weakness and fatigue. EXAM: CT ANGIOGRAPHY CHEST WITH CONTRAST TECHNIQUE: Multidetector CT imaging of the chest was performed using the standard protocol during bolus administration of intravenous contrast. Multiplanar CT image reconstructions and MIPs were obtained to evaluate the vascular anatomy. CONTRAST:  100 cc Isovue 370 COMPARISON:  Chest radiograph dated 12/09/2017 FINDINGS: Cardiovascular: There is no cardiomegaly or pericardial effusion. There is dilatation of the right ventricle with bowing of the intraventricular septum to the left. The right ventricle measures 5.4 cm in diameter and the left ventricle measures 3.1 cm in diameter. Findings consistent with right ventricular strain. There is moderate calcified and noncalcified plaque of the thoracic aorta. There is a large saddle pulmonary radiology embolus straddling the bifurcation of the main pulmonary trunk and extending into the bilateral pulmonary arteries. There is occlusion of the lobar and segmental arteries of the lower lobes. There is dilatation of  the main pulmonary trunk and central pulmonary arteries. Mediastinum/Nodes: There is no hilar or mediastinal adenopathy. Esophagus and the thyroid gland are grossly unremarkable. No mediastinal fluid collection. Lungs/Pleura: There is a 3 cm subpleural bleb in the left lung base. The lungs are otherwise clear. There is no pleural effusion or pneumothorax. The central airways are patent. Upper Abdomen: There is filling defect within the visualized intrahepatic IVC (series 5, image 245). This area is only partially visualized and not well evaluated. Although this may be related to mixing artifact findings is concerning for IVC thrombus. Further evaluation with CT of the abdomen pelvis with IV contrast and delayed images in the venous phase recommended. Cholecystectomy. Musculoskeletal: Degenerative changes of the spine. No acute osseous pathology. Review of  the MIP images confirms the above findings. IMPRESSION: 1. Large occlusive saddle embolus with findings of right heart straining. 2. Partially visualized filling defect in the intrahepatic IVC concerning for thrombus. Further evaluation with CT of the abdomen and pelvis with IV contrast in venous phase recommended. These results were called by telephone at the time of interpretation on 12/10/2017 at 12:46 am to Dr. Marda Stalker , who verbally acknowledged these results. Electronically Signed: By: Anner Crete M.D. On: 12/10/2017 00:54   SIGNIFICANT EVENTS: 2/23 PM: presented to [facility] ER with shock, sob, and weakness, found to have massive PE with saddle embolus. BP improved with IVF's but remained soft.   LINES/TUBES: 1-20G and 1-22G PIV  DISCUSSION: 62yoF with history of HTN, HLD, and GERD, presents to the ER tonight c/o SOB, Dizzyness, and Generalized weakness x 2 days, found to have massive PE with large bilateral clot burden and saddle embolus.  ASSESSMENT / PLAN:  PULMONARY 1. Massive PE: - patient initially in shock that only partially responded to IVF's (BP 70/42 up to now 103/74, in patient with baseline SBP 170's per patient) - CTA Chest on my review shows bilateral PE's in both right and left PA's as well as saddle embolus and a clot in the IVC at level of the liver. There are signs of RV strain on the CT with RV/LV ratio of 1.74.  - agree with heparin gtt that patient is already on - strict bed rest - ordered BLE dopplers and TTE - discussed with patient that I am concerned that if the DVT within the IVC also embolizes, she may end up in shock again or worse in cardiac arrest. She is a good candidate for lytics (no history of CVA, head trauma, GIB; only surgery was cholecystectomy 68yrs ago), but the question is if she needs systemic vs catheter-directed lytics. I would like to give her catheter directed if she stays hemodynamically stable, given the lower risk of bleeding from  the lower dose of TPA. Have discussed her case with IR Physician who wants her transferred over from [facility] to [facility]; if she remains stable after transport, he will come in and evaluate her.  - s/p 2L IVF bolus; start NS @ 125cc/hr  CARDIOVASCULAR 1. Shock - improved after IVF's; still soft BP - hold home antihypertensive medications  RENAL 1. AKI: - creatinine 1.24 on admission, up from 0.90; this is before the IVF's she received though; expect creatinine is now back to baseline - recheck BMP in AM  GASTROINTESTINAL 1. Hx GERD: - H2 blocker BID  HEMATOLOGIC 1. DVT/PE: see plan above  INFECTIOUS No active issues   ENDOCRINE No active issues   NEUROLOGIC No active issues    FAMILY  - Updates:  discussed plan with patient; patient's daughter is on the phone and discussed plan with her as well - Inter-disciplinary family meet or Palliative Care meeting due by:  12/16/17  60 minutes critical care time  Vernie Murders, MD  Pulmonary and [place] Pager: [phone]  12/10/2017, 2:07 AM

## 2017-12-10 NOTE — Progress Notes (Addendum)
PULMONARY / CRITICAL CARE MEDICINE   Name: Kristi Meyers MRN: [id number] DOB: May 19, 1956    ADMISSION DATE:  12/09/2017 CONSULTATION DATE:  12/09/17  REFERRING MD:  Tegeler  CHIEF COMPLAINT:  SOB, Shock  HISTORY OF PRESENT ILLNESS:    62yoF with history of HTN, HLD, and GERD, presents to the ER tonight c/o SOB, Dizzyness, and Generalized weakness x 2 days. She also c/o constipation. She denies Cough, CP, Fever, or LE edema. In the ER she was found to be in shock with BP 70/42, which has since improved to 104/77 after 2L IVF. She reports her SBP as an outpatient is typically in 170's. CTA revealed bilateral PE's in right and left main PA's as well as saddle embolus and DVT within the IVF at the hepatic level. She was started on a Heparin gtt.   She says that her Right knee has been hurting her lately, and she has been going to bed a little earlier at night because of it. 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 She has not had any recent surgeries (only surgery ever was cholecystectomy 9 years ago). Denies history of CVA or GIB or head trauma. Denies any recent travel (car or plane) or being bed bound. Denies any other family members having had DVT's or PE's. Denies any history of malignancy.   PAST MEDICAL HISTORY :  She  has a past medical history of GERD (gastroesophageal reflux disease), High cholesterol, Hypertension, and Vertigo.  PAST SURGICAL HISTORY: She  has a past surgical history that includes Cholecystectomy.  No Known Allergies  No current facility-administered medications on file prior to encounter.    Current Outpatient Medications on File Prior to Encounter  Medication Sig  . atorvastatin (LIPITOR) 40 MG tablet Take 40 mg by mouth daily.  . carvedilol (COREG) 25 MG tablet Take 25 mg by mouth 2 (two) times daily with a meal.  . meclizine (ANTIVERT) 12.5 MG tablet Take 12.5 mg by mouth 2 (two) times daily.  . Olmesartan-Amlodipine-HCTZ 40-10-25  MG TABS Take 1 tablet by mouth daily.  . potassium chloride SA (K-DUR,KLOR-CON) 20 MEQ tablet Take 20 mEq by mouth daily.  . traMADol (ULTRAM) 50 MG tablet Take 1 tablet (50 mg total) by mouth every 8 (eight) hours as needed for severe pain.  Marland Kitchen diclofenac sodium (VOLTAREN) 1 % GEL Apply 2 g topically 3 (three) times daily as needed. (Patient not taking: Reported on 12/10/2017)    FAMILY HISTORY:  Her has no family status information on file.    SOCIAL HISTORY: She  reports that she has been smoking cigarettes.  she has never used smokeless tobacco. She reports that she does not drink alcohol or use drugs.  REVIEW OF SYSTEMS:   Reports SOB, chest pain is much improved. Endorses LE "stiffness".   SUBJECTIVE:  Headed to IR for further eval for lytics  VITAL SIGNS: BP 91/65   Pulse 84   Temp 98.7 F (37.1 C) (Oral)   Resp (!) 23   Ht 5\' 2"  (1.575 m)   Wt 199 lb 1.2 oz (90.3 kg)   LMP 11/17/2008 (Approximate)   SpO2 99%   BMI 36.41 kg/m   HEMODYNAMICS:    VENTILATOR SETTINGS:    INTAKE / OUTPUT: I/O last 3 completed shifts: In: 3274 [I.V.:1274; IV Piggyback:2000] Out: 275 [Urine:275]  PHYSICAL EXAMINATION: General: elderly AA female laying in bed in NAD, [place] in place Neuro: AAOx3, moving all extremities HEENT: OP clear, MM moist Cardiovascular:  RRR no m/r/g Lungs: CTA b/l Abdomen: Soft NTND Musculoskeletal: Trace BLE edema; both legs appear roughly equal in size. Skin: no rashes, skin in tact  LABS:  BMET Recent Labs  Lab 12/09/17 1837  NA 141  K 4.1  CL 110  CO2 22  BUN 22*  CREATININE 1.24*  GLUCOSE 130*    Electrolytes Recent Labs  Lab 12/09/17 1837  CALCIUM 8.3*  MG 2.0    CBC Recent Labs  Lab 12/09/17 1837  WBC 13.0*  HGB 11.0*  HCT 33.6*  PLT 199   Coag's Recent Labs  Lab 12/09/17 1837  INR 1.18   Sepsis Markers Recent Labs  Lab 12/09/17 1845 12/09/17 2106  LATICACIDVEN 1.43 1.25   ABG No results for input(s): PHART,  PCO2ART, PO2ART in the last 168 hours.  Liver Enzymes Recent Labs  Lab 12/09/17 1837  AST 15  ALT 16  ALKPHOS 71  BILITOT 0.8  ALBUMIN 2.9*   Cardiac Enzymes No results for input(s): TROPONINI, PROBNP in the last 168 hours.  Glucose Recent Labs  Lab 12/10/17 0422 12/10/17 0717  GLUCAP 85 73    Imaging Dg Chest 2 View  Result Date: 12/09/2017 CLINICAL DATA:  Hypoxia and cough EXAM: CHEST  2 VIEW COMPARISON:  Chest radiograph 01/18/2005 FINDINGS: The heart size and mediastinal contours are within normal limits. Both lungs are clear. The visualized skeletal structures are unremarkable. IMPRESSION: No active cardiopulmonary disease. Electronically Signed   By: Ulyses Jarred M.D.   On: 12/09/2017 19:13   Ct Angio Chest Pe W And/or Wo Contrast  Addendum Date: 12/10/2017   ADDENDUM REPORT: 12/10/2017 01:11 ADDENDUM: Positive for acute PE with CT evidence of right heart strain (RV/LV Ratio = 1.74) consistent with at least submassive (intermediate risk) PE. The presence of right heart strain has been associated with an increased risk of morbidity and mortality. Please activate Code PE by paging [phone]. Electronically Signed   By: Anner Crete M.D.   On: 12/10/2017 01:11   Addendum Date: 12/10/2017   ADDENDUM REPORT: 12/10/2017 01:05 ADDENDUM: Positive for acute PE with CTevidence of right heart strain (RV/LV Ratio = 1.74) consistent with at least submassive (intermediate risk) PE. The presence of right heart strain has been associated with an increased risk of morbidity and mortality. Electronically Signed   By: Anner Crete M.D.   On: 12/10/2017 01:05   Result Date: 12/10/2017 CLINICAL DATA:  62 year old female with shortness of breath. Generalized weakness and fatigue. EXAM: CT ANGIOGRAPHY CHEST WITH CONTRAST TECHNIQUE: Multidetector CT imaging of the chest was performed using the standard protocol during bolus administration of intravenous contrast. Multiplanar CT image  reconstructions and MIPs were obtained to evaluate the vascular anatomy. CONTRAST:  100 cc Isovue 370 COMPARISON:  Chest radiograph dated 12/09/2017 FINDINGS: Cardiovascular: There is no cardiomegaly or pericardial effusion. There is dilatation of the right ventricle with bowing of the intraventricular septum to the left. The right ventricle measures 5.4 cm in diameter and the left ventricle measures 3.1 cm in diameter. Findings consistent with right ventricular strain. There is moderate calcified and noncalcified plaque of the thoracic aorta. There is a large saddle pulmonary radiology embolus straddling the bifurcation of the main pulmonary trunk and extending into the bilateral pulmonary arteries. There is occlusion of the lobar and segmental arteries of the lower lobes. There is dilatation of the main pulmonary trunk and central pulmonary arteries. Mediastinum/Nodes: There is no hilar or mediastinal adenopathy. Esophagus and the thyroid gland are grossly unremarkable. No  mediastinal fluid collection. Lungs/Pleura: There is a 3 cm subpleural bleb in the left lung base. The lungs are otherwise clear. There is no pleural effusion or pneumothorax. The central airways are patent. Upper Abdomen: There is filling defect within the visualized intrahepatic IVC (series 5, image 245). This area is only partially visualized and not well evaluated. Although this may be related to mixing artifact findings is concerning for IVC thrombus. Further evaluation with CT of the abdomen pelvis with IV contrast and delayed images in the venous phase recommended. Cholecystectomy. Musculoskeletal: Degenerative changes of the spine. No acute osseous pathology. Review of the MIP images confirms the above findings. IMPRESSION: 1. Large occlusive saddle embolus with findings of right heart straining. 2. Partially visualized filling defect in the intrahepatic IVC concerning for thrombus. Further evaluation with CT of the abdomen and pelvis  with IV contrast in venous phase recommended. These results were called by telephone at the time of interpretation on 12/10/2017 at 12:46 am to Dr. Marda Stalker , who verbally acknowledged these results. Electronically Signed: By: Anner Crete M.D. On: 12/10/2017 00:54   Ct Venogram Abd/pel  Result Date: 12/10/2017 CLINICAL DATA:  Submassive pulmonary embolism. 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 EXAM: CT VENOGRAM ABD-PELVIS TECHNIQUE: Multidetector CT imaging of the abdomen and pelvis was performed using the standard protocol during bolus administration of intravenous contrast. Multiplanar reconstructed images and MIPs were obtained and reviewed to evaluate the vascular anatomy. CONTRAST:  146mL ISOVUE-300 IOPAMIDOL (ISOVUE-300) INJECTION 61% COMPARISON:  01/05/2005 FINDINGS: VASCULAR Veins: Delayed phase imaging demonstrates no evidence of DVT. Specifically there is no evidence of thrombus within the IVC. Some mixing artifact is noted. The abnormality on the accompanying chest CT represents artifact. Arterial: Portal venous phase imaging demonstrates atherosclerotic changes of the abdominal aorta. Maximal diameter of the abdominal aorta is 3.1 cm. 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 There is ectasia of the right internal iliac artery. Review of the MIP images confirms the above findings. NON-VASCULAR Lower chest: Mild dependent atelectasis. Hepatobiliary: Postcholecystectomy. Tiny cyst in the right lobe inferiorly on image 33 of series 3. Pancreas: Unremarkable Spleen: Unremarkable Adrenals/Urinary Tract: Kidneys are within normal limits. There is contrast in the collecting system from the previous scan. Tiny cysts are present in the kidneys. Adrenal glands are within normal limits. Bladder is decompressed by Foley catheter. Stomach/Bowel: Stomach and duodenum are within normal limits. There is no evidence of  small-bowel obstruction. Normal appendix. No obvious mass in the colon. Lymphatic: No abnormal retroperitoneal adenopathy. Reproductive: Uterus and adnexa are within normal limits. Other: Tiny sebaceous cyst in the ischial rectal fossa on image 92 of series 3. No free-fluid. Musculoskeletal: No vertebral compression deformity. Advanced multilevel degenerative disc disease in the lumbar spine. IMPRESSION: VASCULAR There is no evidence of DVT on this study. Specifically, there is no evidence of thrombus in the IVC. The finding and noted on the accompanying chest CT represents artifact. Abdominal aortic aneurysm maximal diameter is 3.1 cm. Recommend followup by ultrasound in 3 years. This recommendation follows ACR consensus guidelines: White Paper of the ACR Incidental Findings Committee II on Vascular Findings. 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 NON-VASCULAR Chronic changes. Electronically Signed   By: Marybelle Killings M.D.   On: 12/10/2017 06:38   STUDIES:  CTA 2/25 > Positive for acute PE with CT evidence of right heart strain (RV/LV Ratio = 1.74)  consistent with at least submassive (intermediate risk) PE.  CT venogram 2/25 > no thrombus in IVC  CULTURES: none  ANTIBIOTICS: none  SIGNIFICANT EVENTS: 2/23 PM: presented to [facility] ER with shock, sob, and weakness, found to have massive PE with saddle embolus. BP improved with IVF's but remained soft.  2/25 tPA running per IR  LINES/TUBES: PIV Foley 2/25  DISCUSSION: 62yoF with history of HTN, HLD, and GERD, presents to the ER tonight c/o SOB, Dizzyness, and Generalized weakness x 2 days, found to have massive PE with large bilateral clot burden and saddle embolus.  ASSESSMENT / PLAN:  PULMONARY A: Massive PE P:   Continue heparin drip Obtain bilateral LE dopplers Obtain TTE IR following for tPA  CARDIOVASCULAR A:  Shock- improved w/ fluid resuscitation Elevated troponin- likely 2/2  demand P:  Monitor on tele Continue MIVF Hold home BP meds Echo as above  RENAL A:   AKI P:   Follow BMP  GASTROINTESTINAL A:   Hx gerd P:   Continue H2 blocker BID  HEMATOLOGIC A:   PE P:  Heparin drip Monitor CBC, heparin level, fibrinogen q6 hrs per IR  INFECTIOUS A:   No issues P:   Monitor for fevers, leukocytosis  ENDOCRINE A:   hyperglycemia   P:   Monitor CBGs  NEUROLOGIC A:   No issues P:   Monitor mental status   FAMILY  - Updates: family at bedside updated 2/25 - Inter-disciplinary family meet or Palliative Care meeting due by:  3/3  Lucila Maine, DO PGY-2, [facility] 12/10/2017 8:24 AM  Attending Note:  62 year old female with PMH above who presents to the ED with SOB and hypotension.  CTA was done that I reviewed myself that showed bilateral PEs.  On exam, lungs are clear but BS are distant.  Discussed with [place] resident.  Had EKOS catheters placed today and patient already feels better and O2 demand is decreased.  Will continue infusion of lytics for now.  Titrate O2 for sat of 88-92%.  Patient was out of work for 4 wks and has been doing less physical activities.  She has not had any of her cancer screening and that was relayed to her how important it is for her to get that done.  Hold in the ICU for EKOS.  The patient is critically ill with multiple organ systems failure and requires high complexity decision making for assessment and support, frequent evaluation and titration of therapies, application of advanced monitoring technologies and extensive interpretation of multiple databases.   Critical Care Time devoted to patient care services described in this note is  35  Minutes. This time reflects time of care of this signee Dr Jennet Maduro. This critical care time does not reflect procedure time, or teaching time or supervisory time of PA/NP/Med student/Med Resident etc but could involve care discussion time.  Rush Farmer, M.D. [facility] Pulmonary/Critical Care Medicine. Pager: [phone]. After hours pager: [phone].

## 2017-12-10 NOTE — ED Notes (Signed)
Bed: AT55 Expected date:  Expected time:  Means of arrival:  Comments: Hold Rm. 6

## 2017-12-10 NOTE — Sedation Documentation (Signed)
PAP: 47/9 (26)

## 2017-12-10 NOTE — Progress Notes (Signed)
Pt c/o nose bleed. Humidification applied to oxygen. Radiology notified and heparin level within normal range per pharmacy. Pt bleeding under control and no complications with breathing. Gauze packing added to nose. VSS. Will continue to monitor pt closely. Leanne Chang, RN

## 2017-12-10 NOTE — Progress Notes (Signed)
ANTICOAGULATION CONSULT NOTE -Follow-Up Consult  Pharmacy Consult for IV heparin Indication: pulmonary embolus  No Known Allergies  Patient Measurements: Height: 5\' 2"  (157.5 cm) Weight: 199 lb 1.2 oz (90.3 kg) IBW/kg (Calculated) : 50.1 Heparin Dosing Weight: 66 kg  Vital Signs: Temp: 98.7 F (37.1 C) (02/25 1520) Temp Source: Oral (02/25 1520) BP: 82/59 (02/25 1500) Pulse Rate: 82 (02/25 1500)  Labs: Recent Labs    12/09/17 1837 12/10/17 0800 12/10/17 0933 12/10/17 1438  HGB 11.0*  --  9.6* 9.4*  HCT 33.6*  --  30.2* 29.5*  PLT 199  --  157 165  LABPROT 14.9  --   --   --   INR 1.18  --   --   --   HEPARINUNFRC  --  0.96* 0.73* 0.55  CREATININE 1.24*  --   --   --     Estimated Creatinine Clearance: 47.9 mL/min (A) (by C-G formula based on SCr of 1.24 mg/dL (H)).   Medical History: Past Medical History:  Diagnosis Date  . GERD (gastroesophageal reflux disease)   . High cholesterol   . Hypertension   . Vertigo     Medications:  Scheduled:  . famotidine  20 mg Oral BID  . fentaNYL      . lidocaine      . midazolam      . sodium chloride flush  3 mL Intravenous Q12H   Infusions:  . sodium chloride    . sodium chloride 125 mL/hr at 12/10/17 0940  . sodium chloride    . sodium chloride 35 mL/hr at 12/10/17 1018  . sodium chloride 35 mL/hr at 12/10/17 1017  . sodium chloride    . sodium chloride    . alteplase (tPA/ ACTIVASE) PE Lysis 12 mg/250 mL (BILATERAL)    . alteplase (tPA/ ACTIVASE) PE Lysis 12 mg/250 mL (BILATERAL)    . heparin 1,000 Units/hr (12/10/17 1154)    Assessment: Kristi Meyers c/o weakness, fatigue and hypotension and found to have large occlusive saddle PE with right heart strain. Pharmacy consulted for heparin dosing.  Heparin level this evening is therapeutic after a rate reduction earlier today (HL 0.55 << 0.73, goal of 0.3-0.7). Fibrinogen is 379 and okay. Hgb/Hct stable from AM labs. Per RN report - pt currently has a nosebleed and  they are attempting to stop. RN to call IR and will contact pharmacy if anything is held.   Goal of Therapy:  Heparin level 0.3-0.7 units/ml Monitor platelets by anticoagulation protocol: Yes   Plan:  - Continue Heparin at 1000 units/hr (10 ml/hr) - Every 6 hour heparin, CBC and fibrinogen while on EKOS - Monitor EKOS therapy - Monitor for s/sx of bleeding   Thank you for allowing pharmacy to be a part of this patient's care.  Alycia Rossetti, PharmD, BCPS Clinical Pharmacist Pager: [phone] 12/10/2017 5:13 PM

## 2017-12-10 NOTE — Progress Notes (Signed)
eLink Physician-Brief Progress Note Patient Name: Kristi Meyers DOB: [birth year] MRN: [id number]   Date of Service  12/10/2017  HPI/Events of Note  Patient c/o pain.   eICU Interventions  Will order Fentanyl 25 mcg IV now.      Intervention Category Intermediate Interventions: Pain - evaluation and management  Sommer,Steven Cornelia Copa 12/10/2017, 9:31 PM

## 2017-12-10 NOTE — ED Notes (Signed)
[facility] called for  transport. 

## 2017-12-10 NOTE — Consult Note (Signed)
Chief Complaint: Patient was seen in consultation today for No chief complaint on file.  at the request of * No referring provider recorded for this case *  Referring Physician(s): * No referring provider recorded for this case *  Supervising Physician: Marybelle Killings  Patient Status: [facility] - In-pt  History of Present Illness: Kristi Meyers is a 62 y.o. female who presented with Syncope and hypotension. She was found to have submassive PE, with positive RV/LV ratio and positive troponin. Her SBP in the ED was recorded at 70 mmHg. CTA chest shows saddle embolus and possible IVC thrombus. She feels weak and mildly dyspneic. No prior history of DVT/PE. No history of CNS bleeding, GI bleeding, recent CVA, recent surgery.  Past Medical History:  Diagnosis Date  . GERD (gastroesophageal reflux disease)   . High cholesterol   . Hypertension   . Vertigo     Past Surgical History:  Procedure Laterality Date  . CHOLECYSTECTOMY      Allergies: Patient has no known allergies.  Medications: Prior to Admission medications   Medication Sig Start Date End Date Taking? Authorizing Provider  atorvastatin (LIPITOR) 40 MG tablet Take 40 mg by mouth daily.   Yes [provider]  carvedilol (COREG) 25 MG tablet Take 25 mg by mouth 2 (two) times daily with a meal.   Yes [provider]  meclizine (ANTIVERT) 12.5 MG tablet Take 12.5 mg by mouth 2 (two) times daily.   Yes [provider]  Olmesartan-Amlodipine-HCTZ 40-10-25 MG TABS Take 1 tablet by mouth daily.   Yes [provider]  potassium chloride SA (K-DUR,KLOR-CON) 20 MEQ tablet Take 20 mEq by mouth daily.   Yes [provider]  traMADol (ULTRAM) 50 MG tablet Take 1 tablet (50 mg total) by mouth every 8 (eight) hours as needed for severe pain. 10/29/17  Yes Jaynee Eagles, PA-C  diclofenac sodium (VOLTAREN) 1 % GEL Apply 2 g topically 3 (three) times daily as needed. Patient not taking: Reported on  12/10/2017 11/08/17   Billie Ruddy, MD     No family history on file.  Social History   Socioeconomic History  . Marital status: Single    Spouse name: None  . Number of children: None  . Years of education: None  . Highest education level: None  Social Needs  . Financial resource strain: None  . Food insecurity - worry: None  . Food insecurity - inability: None  . Transportation needs - medical: None  . Transportation needs - non-medical: None  Occupational History  . None  Tobacco Use  . Smoking status: Current Some Day Smoker    Types: Cigarettes  . Smokeless tobacco: Never Used  Substance and Sexual Activity  . Alcohol use: No  . Drug use: No  . Sexual activity: None  Other Topics Concern  . None  Social History Narrative  . None     Review of Systems: A 12 point ROS discussed and pertinent positives are indicated in the HPI above.  All other systems are negative.  Review of Systems  Vital Signs: BP 96/73   Pulse 80   Temp 98.7 F (37.1 C) (Oral)   Resp 17   Wt 203 lb 11.3 oz (92.4 kg)   LMP 11/17/2008 (Approximate)   SpO2 100%   BMI 34.69 kg/m   Physical Exam  Constitutional: She is oriented to person, place, and time. She appears well-developed and well-nourished.  Cardiovascular: Normal rate and regular rhythm.  Pulmonary/Chest: Effort normal.  Neurological: She is alert and oriented to person, place, and time.    Imaging: Dg Chest 2 View  Result Date: 12/09/2017 CLINICAL DATA:  Hypoxia and cough EXAM: CHEST  2 VIEW COMPARISON:  Chest radiograph 01/18/2005 FINDINGS: The heart size and mediastinal contours are within normal limits. Both lungs are clear. The visualized skeletal structures are unremarkable. IMPRESSION: No active cardiopulmonary disease. Electronically Signed   By: Ulyses Jarred M.D.   On: 12/09/2017 19:13   Ct Angio Chest Pe W And/or Wo Contrast  Addendum Date: 12/10/2017   ADDENDUM REPORT: 12/10/2017 01:11 ADDENDUM: Positive  for acute PE with CT evidence of right heart strain (RV/LV Ratio = 1.74) consistent with at least submassive (intermediate risk) PE. The presence of right heart strain has been associated with an increased risk of morbidity and mortality. Please activate Code PE by paging [phone]. Electronically Signed   By: Anner Crete M.D.   On: 12/10/2017 01:11   Addendum Date: 12/10/2017   ADDENDUM REPORT: 12/10/2017 01:05 ADDENDUM: Positive for acute PE with CTevidence of right heart strain (RV/LV Ratio = 1.74) consistent with at least submassive (intermediate risk) PE. The presence of right heart strain has been associated with an increased risk of morbidity and mortality. Electronically Signed   By: Anner Crete M.D.   On: 12/10/2017 01:05   Result Date: 12/10/2017 CLINICAL DATA:  62 year old female with shortness of breath. Generalized weakness and fatigue. EXAM: CT ANGIOGRAPHY CHEST WITH CONTRAST TECHNIQUE: Multidetector CT imaging of the chest was performed using the standard protocol during bolus administration of intravenous contrast. Multiplanar CT image reconstructions and MIPs were obtained to evaluate the vascular anatomy. CONTRAST:  100 cc Isovue 370 COMPARISON:  Chest radiograph dated 12/09/2017 FINDINGS: Cardiovascular: There is no cardiomegaly or pericardial effusion. There is dilatation of the right ventricle with bowing of the intraventricular septum to the left. The right ventricle measures 5.4 cm in diameter and the left ventricle measures 3.1 cm in diameter. Findings consistent with right ventricular strain. There is moderate calcified and noncalcified plaque of the thoracic aorta. There is a large saddle pulmonary radiology embolus straddling the bifurcation of the main pulmonary trunk and extending into the bilateral pulmonary arteries. There is occlusion of the lobar and segmental arteries of the lower lobes. There is dilatation of the main pulmonary trunk and central pulmonary arteries.  Mediastinum/Nodes: There is no hilar or mediastinal adenopathy. Esophagus and the thyroid gland are grossly unremarkable. No mediastinal fluid collection. Lungs/Pleura: There is a 3 cm subpleural bleb in the left lung base. The lungs are otherwise clear. There is no pleural effusion or pneumothorax. The central airways are patent. Upper Abdomen: There is filling defect within the visualized intrahepatic IVC (series 5, image 245). This area is only partially visualized and not well evaluated. Although this may be related to mixing artifact findings is concerning for IVC thrombus. Further evaluation with CT of the abdomen pelvis with IV contrast and delayed images in the venous phase recommended. Cholecystectomy. Musculoskeletal: Degenerative changes of the spine. No acute osseous pathology. Review of the MIP images confirms the above findings. IMPRESSION: 1. Large occlusive saddle embolus with findings of right heart straining. 2. Partially visualized filling defect in the intrahepatic IVC concerning for thrombus. Further evaluation with CT of the abdomen and pelvis with IV contrast in venous phase recommended. These results were called by telephone at the time of interpretation on 12/10/2017 at 12:46 am to Dr. Marda Stalker , who verbally acknowledged these  results. Electronically Signed: By: Anner Crete M.D. On: 12/10/2017 00:54    Labs:  CBC: Recent Labs    10/29/17 1130 12/09/17 1837  WBC  --  13.0*  HGB 13.6 11.0*  HCT 40.0 33.6*  PLT  --  199    COAGS: Recent Labs    12/09/17 1837  INR 1.18    BMP: Recent Labs    10/29/17 1130 12/09/17 1837  NA 145 141  K 3.9 4.1  CL 106 110  CO2  --  22  GLUCOSE 124* 130*  BUN 12 22*  CALCIUM  --  8.3*  CREATININE 0.90 1.24*  GFRNONAA  --  45*  GFRAA  --  52*    LIVER FUNCTION TESTS: Recent Labs    12/09/17 1837  BILITOT 0.8  AST 15  ALT 16  ALKPHOS 71  PROT 6.4*  ALBUMIN 2.9*    TUMOR MARKERS: No results for  input(s): AFPTM, CEA, CA199, CHROMGRNA in the last 8760 hours.  Assessment and Plan:  Submassive PE. She is a candidate for PE lysis. Will get CT venogram to see extent of IVC thrombus to decide PE lysis vs. Systemic lytics.  Thank you for this interesting consult.  I greatly enjoyed meeting Kristi Meyers and look forward to participating in their care.  A copy of this report was sent to the requesting provider on this date.  Electronically Signed: Kaleeyah Cuffie, ART A, MD 12/10/2017, 5:56 AM   I spent a total of 55 Mi[place]utes  in face to face in clinical consultation, greater than 50% of which was counseling/coordinating care for PE lysis.

## 2017-12-10 NOTE — Progress Notes (Signed)
  Echocardiogram 2D Echocardiogram has been attempted. Patient left for MRI. Will attempt at another time.  [name] [name] 12/10/2017, 2:04 PM

## 2017-12-10 NOTE — ED Notes (Signed)
ED Provider at bedside. 

## 2017-12-10 NOTE — Progress Notes (Signed)
ANTICOAGULATION CONSULT NOTE -Follow-Up Consult  Pharmacy Consult for IV heparin Indication: pulmonary embolus  No Known Allergies  Patient Measurements: Height: 5\' 2"  (157.5 cm) Weight: 199 lb 1.2 oz (90.3 kg) IBW/kg (Calculated) : 50.1 Heparin Dosing Weight: 66 kg  Vital Signs: Temp: 98.9 F (37.2 C) (02/25 0906) Temp Source: Oral (02/25 0906) BP: 98/75 (02/25 0940) Pulse Rate: 88 (02/25 0940)  Labs: Recent Labs    12/09/17 1837 12/10/17 0800  HGB 11.0*  --   HCT 33.6*  --   PLT 199  --   LABPROT 14.9  --   INR 1.18  --   HEPARINUNFRC  --  0.96*  CREATININE 1.24*  --     Estimated Creatinine Clearance: 47.9 mL/min (A) (by C-G formula based on SCr of 1.24 mg/dL (H)).   Medical History: Past Medical History:  Diagnosis Date  . GERD (gastroesophageal reflux disease)   . High cholesterol   . Hypertension   . Vertigo     Medications:  Scheduled:  . famotidine  20 mg Oral BID  . fentaNYL      . lidocaine      . midazolam      . sodium chloride flush  3 mL Intravenous Q12H   Infusions:  . sodium chloride    . sodium chloride 125 mL/hr at 12/10/17 0940  . sodium chloride    . sodium chloride    . sodium chloride    . sodium chloride    . sodium chloride    . alteplase (tPA/ ACTIVASE) PE Lysis 12 mg/250 mL (BILATERAL)    . alteplase (tPA/ ACTIVASE) PE Lysis 12 mg/250 mL (BILATERAL)    . heparin 1,200 Units/hr (12/10/17 0133)    Assessment: 22 yoF c/o weakness, fatigue and hypotension. IV heparin for large occlusive saddle PE with right heart strain. Initial heparin level was elevated at 0.96. CBC is slightly low at 11/33.6 and platelets 199. Patient has now been started on EKOS with start time around 0830 on 2/25.    Goal of Therapy:  Heparin level 0.3-0.7 units/ml Monitor platelets by anticoagulation protocol: Yes   Plan:  Decrease heparin  to 1000 units/hr Every 6 hour heparin, CBC and fibrinogen while on EKOS Monitor EKOS therapy Monitor for  s/sx of bleeding   Jimmy Footman, PharmD, BCPS PGY2 Infectious Diseases Pharmacy Resident Pager: [phone]  12/10/2017,10:13 AM

## 2017-12-10 NOTE — Sedation Documentation (Signed)
Explained sedation and medications to patient. Patient has no questions concerning procedure at this time. Patient alert and oriented. Heparin and NS gtt infusing. Foley catheter in situ

## 2017-12-10 NOTE — Progress Notes (Signed)
  Echocardiogram 2D Echocardiogram has been performed.  Zameria Vogl G Zaylin Runco 12/10/2017, 3:04 PM

## 2017-12-10 NOTE — ED Notes (Signed)
ED TO INPATIENT HANDOFF REPORT  Name/Age/Gender Kristi Meyers 62 y.o. female  Code Status    Code Status Orders  (From admission, onward)        Start     Ordered   12/10/17 0222  Full code  Continuous     12/10/17 0222    Code Status History    Date Active Date Inactive Code Status Order ID Comments User Context   This patient has a current code status but no historical code status.      Home/SNF/Other Home  Chief Complaint hypotension  Level of Care/Admitting Diagnosis ED Disposition    ED Disposition Condition Comment   Admit  Hospital Area: [facility] [[id number]]  Level of Care: ICU [6]  Diagnosis: Acute saddle pulmonary embolus [facility]) [[id number]]  Admitting Physician: Reyne Dumas [[id number]]  Attending Physician: Reyne Dumas [[id number]]  Estimated length of stay: 5 - 7 days  Certification:: I certify this patient will need inpatient services for at least 2 midnights  PT Class (Do Not Modify): Inpatient [101]  PT Acc Code (Do Not Modify): Private [1]       Medical History Past Medical History:  Diagnosis Date  . GERD (gastroesophageal reflux disease)   . High cholesterol   . Hypertension   . Vertigo     Allergies No Known Allergies  IV Location/Drains/Wounds Patient Lines/Drains/Airways Status   Active Line/Drains/Airways    Name:   Placement date:   Placement time:   Site:   Days:   Peripheral IV 11/17/16 Left Antecubital   11/17/16    0900    Antecubital   388   Peripheral IV 12/09/17 Left Antecubital   12/09/17    1900    Antecubital   1   Peripheral IV 12/10/17 Right Hand   12/10/17    0122    Hand   less than 1          Labs/Imaging Results for orders placed or performed during the hospital encounter of 12/09/17 (from the past 48 hour(s))  Urinalysis, Routine w reflex microscopic     Status: Abnormal   Collection Time: 12/09/17  6:09 PM  Result Value Ref Range   Color, Urine AMBER (A) YELLOW    Comment:  BIOCHEMICALS MAY BE AFFECTED BY COLOR   APPearance CLOUDY (A) CLEAR   Specific Gravity, Urine 1.021 1.005 - 1.030   pH 5.0 5.0 - 8.0   Glucose, UA NEGATIVE NEGATIVE mg/dL   Hgb urine dipstick NEGATIVE NEGATIVE   Bilirubin Urine NEGATIVE NEGATIVE   Ketones, ur 5 (A) NEGATIVE mg/dL   Protein, ur 100 (A) NEGATIVE mg/dL   Nitrite NEGATIVE NEGATIVE   Leukocytes, UA NEGATIVE NEGATIVE   RBC / HPF 0-5 0 - 5 RBC/hpf   WBC, UA 0-5 0 - 5 WBC/hpf   Bacteria, UA MANY (A) NONE SEEN   Squamous Epithelial / LPF 0-5 (A) NONE SEEN   Mucus PRESENT    Hyaline Casts, UA PRESENT     Comment: Performed at [facility], [place] [address] [place], [place] [address]  CBC with Differential     Status: Abnormal   Collection Time: 12/09/17  6:37 PM  Result Value Ref Range   WBC 13.0 (H) 4.0 - 10.5 K/uL   RBC 3.97 3.87 - 5.11 MIL/uL   Hemoglobin 11.0 (L) 12.0 - 15.0 g/dL   HCT 33.6 (L) 36.0 - 46.0 %   MCV 84.6 78.0 - 100.0 fL  MCH 27.7 26.0 - 34.0 pg   MCHC 32.7 30.0 - 36.0 g/dL   RDW 14.7 11.5 - 15.5 %   Platelets 199 150 - 400 K/uL   Neutrophils Relative % 77 %   Neutro Abs 9.9 (H) 1.7 - 7.7 K/uL   Lymphocytes Relative 15 %   Lymphs Abs 2.0 0.7 - 4.0 K/uL   Monocytes Relative 8 %   Monocytes Absolute 1.0 0.1 - 1.0 K/uL   Eosinophils Relative 0 %   Eosinophils Absolute 0.0 0.0 - 0.7 K/uL   Basophils Relative 0 %   Basophils Absolute 0.0 0.0 - 0.1 K/uL    Comment: Performed at [facility], [place] [address], [place], [place] [address]  Comprehensive metabolic panel     Status: Abnormal   Collection Time: 12/09/17  6:37 PM  Result Value Ref Range   Sodium 141 135 - 145 mmol/L   Potassium 4.1 3.5 - 5.1 mmol/L   Chloride 110 101 - 111 mmol/L   CO2 22 22 - 32 mmol/L   Glucose, Bld 130 (H) 65 - 99 mg/dL   BUN 22 (H) 6 - 20 mg/dL   Creatinine, Ser 1.24 (H) 0.44 - 1.00 mg/dL   Calcium 8.3 (L) 8.9 - 10.3 mg/dL   Total Protein 6.4 (L) 6.5 - 8.1 g/dL   Albumin 2.9 (L)  3.5 - 5.0 g/dL   AST 15 15 - 41 U/L   ALT 16 14 - 54 U/L   Alkaline Phosphatase 71 38 - 126 U/L   Total Bilirubin 0.8 0.3 - 1.2 mg/dL   GFR calc non Af Amer 45 (L) >60 mL/min   GFR calc Af Amer 52 (L) >60 mL/min    Comment: (NOTE) The eGFR has been calculated using the CKD EPI equation. This calculation has not been validated in all clinical situations. eGFR's persistently <60 mL/min signify possible Chronic Kidney Disease.    Anion gap 9 5 - 15    Comment: Performed at [facility], [place] [address], [place], [place] [address]  Lipase, blood     Status: None   Collection Time: 12/09/17  6:37 PM  Result Value Ref Range   Lipase 20 11 - 51 U/L    Comment: Performed at [facility], [place] [address] [place], [place] [address]  Protime-INR     Status: None   Collection Time: 12/09/17  6:37 PM  Result Value Ref Range   Prothrombin Time 14.9 11.4 - 15.2 seconds   INR 1.18     Comment: Performed at [facility], [place] [address], [place], [place] [address]  TSH     Status: None   Collection Time: 12/09/17  6:37 PM  Result Value Ref Range   TSH 1.187 0.350 - 4.500 uIU/mL    Comment: Performed by a 3rd Generation assay with a functional sensitivity of <=0.01 uIU/mL. Performed at [facility], [place] [address], [place], [place] [address]   Magnesium     Status: None   Collection Time: 12/09/17  6:37 PM  Result Value Ref Range   Magnesium 2.0 1.7 - 2.4 mg/dL    Comment: Performed at [facility], [place] [address] [place], [place] [address]  I-Stat Troponin, ED (not at [facility])     Status: Abnormal   Collection Time: 12/09/17  6:43 PM  Result Value Ref Range   Troponin i, poc 0.41 (HH) 0.00 - 0.08 ng/mL   Comment NOTIFIED PHYSICIAN  Comment 3            Comment: Due to the release kinetics of cTnI, a negative result within the first hours of the onset of symptoms does not rule out myocardial  infarction with certainty. If myocardial infarction is still suspected, repeat the test at appropriate intervals.   I-Stat CG4 Lactic Acid, ED     Status: None   Collection Time: 12/09/17  6:45 PM  Result Value Ref Range   Lactic Acid, Venous 1.43 0.5 - 1.9 mmol/L  I-Stat CG4 Lactic Acid, ED     Status: None   Collection Time: 12/09/17  9:06 PM  Result Value Ref Range   Lactic Acid, Venous 1.25 0.5 - 1.9 mmol/L  I-Stat Troponin, ED (not at [facility])     Status: Abnormal   Collection Time: 12/09/17  9:20 PM  Result Value Ref Range   Troponin i, poc 0.36 (HH) 0.00 - 0.08 ng/mL   Comment NOTIFIED PHYSICIAN    Comment 3            Comment: Due to the release kinetics of cTnI, a negative result within the first hours of the onset of symptoms does not rule out myocardial infarction with certainty. If myocardial infarction is still suspected, repeat the test at appropriate intervals.    Dg Chest 2 View  Result Date: 12/09/2017 CLINICAL DATA:  Hypoxia and cough EXAM: CHEST  2 VIEW COMPARISON:  Chest radiograph 01/18/2005 FINDINGS: The heart size and mediastinal contours are within normal limits. Both lungs are clear. The visualized skeletal structures are unremarkable. IMPRESSION: No active cardiopulmonary disease. Electronically Signed   By: Ulyses Jarred M.D.   On: 12/09/2017 19:13   Ct Angio Chest Pe W And/or Wo Contrast  Addendum Date: 12/10/2017   ADDENDUM REPORT: 12/10/2017 01:11 ADDENDUM: Positive for acute PE with CT evidence of right heart strain (RV/LV Ratio = 1.74) consistent with at least submassive (intermediate risk) PE. The presence of right heart strain has been associated with an increased risk of morbidity and mortality. Please activate Code PE by paging [phone]. Electronically Signed   By: Anner Crete M.D.   On: 12/10/2017 01:11   Addendum Date: 12/10/2017   ADDENDUM REPORT: 12/10/2017 01:05 ADDENDUM: Positive for acute PE with CTevidence of right heart strain (RV/LV  Ratio = 1.74) consistent with at least submassive (intermediate risk) PE. The presence of right heart strain has been associated with an increased risk of morbidity and mortality. Electronically Signed   By: Anner Crete M.D.   On: 12/10/2017 01:05   Result Date: 12/10/2017 CLINICAL DATA:  62 year old female with shortness of breath. Generalized weakness and fatigue. EXAM: CT ANGIOGRAPHY CHEST WITH CONTRAST TECHNIQUE: Multidetector CT imaging of the chest was performed using the standard protocol during bolus administration of intravenous contrast. Multiplanar CT image reconstructions and MIPs were obtained to evaluate the vascular anatomy. CONTRAST:  100 cc Isovue 370 COMPARISON:  Chest radiograph dated 12/09/2017 FINDINGS: Cardiovascular: There is no cardiomegaly or pericardial effusion. There is dilatation of the right ventricle with bowing of the intraventricular septum to the left. The right ventricle measures 5.4 cm in diameter and the left ventricle measures 3.1 cm in diameter. Findings consistent with right ventricular strain. There is moderate calcified and noncalcified plaque of the thoracic aorta. There is a large saddle pulmonary radiology embolus straddling the bifurcation of the main pulmonary trunk and extending into the bilateral pulmonary arteries. There is occlusion of the lobar and segmental arteries of the lower  lobes. There is dilatation of the main pulmonary trunk and central pulmonary arteries. Mediastinum/Nodes: There is no hilar or mediastinal adenopathy. Esophagus and the thyroid gland are grossly unremarkable. No mediastinal fluid collection. Lungs/Pleura: There is a 3 cm subpleural bleb in the left lung base. The lungs are otherwise clear. There is no pleural effusion or pneumothorax. The central airways are patent. Upper Abdomen: There is filling defect within the visualized intrahepatic IVC (series 5, image 245). This area is only partially visualized and not well evaluated.  Although this may be related to mixing artifact findings is concerning for IVC thrombus. Further evaluation with CT of the abdomen pelvis with IV contrast and delayed images in the venous phase recommended. Cholecystectomy. Musculoskeletal: Degenerative changes of the spine. No acute osseous pathology. Review of the MIP images confirms the above findings. IMPRESSION: 1. Large occlusive saddle embolus with findings of right heart straining. 2. Partially visualized filling defect in the intrahepatic IVC concerning for thrombus. Further evaluation with CT of the abdomen and pelvis with IV contrast in venous phase recommended. These results were called by telephone at the time of interpretation on 12/10/2017 at 12:46 am to Dr. Marda Stalker , who verbally acknowledged these results. Electronically Signed: By: Anner Crete M.D. On: 12/10/2017 00:54    Pending Labs Unresulted Labs (From admission, onward)   Start     Ordered   12/10/17 0800  Heparin level (unfractionated)  Once-Timed,   R     12/10/17 0132   12/10/17 0800  CBC  Daily,   R     12/10/17 0207   12/10/17 0500  CBC  Tomorrow morning,   R     12/10/17 0222   12/10/17 5053  Basic metabolic panel  Tomorrow morning,   R     12/10/17 0222   12/10/17 0500  Magnesium  Tomorrow morning,   R     12/10/17 0222   12/10/17 0500  Phosphorus  Tomorrow morning,   R     12/10/17 0222   12/10/17 0221  HIV antibody (Routine Testing)  Once,   R     12/10/17 0222   12/10/17 0104  APTT  Add-on,   R    Comments:  Please add to previous lab or draw STAT, starting IV heparin    12/10/17 0104   12/09/17 1809  Urine culture  STAT,   STAT     12/09/17 1810   12/09/17 1809  Blood culture (routine x 2)  BLOOD CULTURE X 2,   STAT     12/09/17 1810      Vitals/Pain Today's Vitals   12/10/17 0130 12/10/17 0134 12/10/17 0215 12/10/17 0224  BP:  103/74  100/78  Pulse: 80 79 85 82  Resp: _0 Temp:      TempSrc:      SpO2: 100% 100% 100%  100%  Weight:      PainSc:  0-No pain  0-No pain    Isolation Precautions No active isolations  Medications Medications  heparin ADULT infusion 100 units/mL (25000 units/248m sodium chloride 0.45%) (1,200 Units/hr Intravenous New Bag/Given 12/10/17 0133)  0.9 %  sodium chloride infusion (not administered)  famotidine (PEPCID) tablet 20 mg (not administered)  ondansetron (ZOFRAN) injection 4 mg (not administered)  acetaminophen (TYLENOL) tablet 650 mg (not administered)  0.9 %  sodium chloride infusion ( Intravenous New Bag/Given 12/10/17 0228)  sodium chloride 0.9 % bolus 1,000 mL (0 mLs Intravenous Stopped 12/09/17 2055)  acetaminophen (  TYLENOL) tablet 650 mg (650 mg Oral Given 12/09/17 2221)  iopamidol (ISOVUE-370) 76 % injection 100 mL (100 mLs Intravenous Contrast Given 12/10/17 0028)  sodium chloride 0.9 % bolus 1,000 mL (1,000 mLs Intravenous New Bag/Given 12/10/17 0054)  heparin bolus via infusion 5,000 Units (5,000 Units Intravenous Bolus from Bag 12/10/17 0128)    Mobility walks

## 2017-12-10 NOTE — Procedures (Signed)
Interventional Radiology Procedure Note  Procedure: US guided access x2 of the right CFV for placement of bilateral EKOS, ultra-sound accelerated lysis catheters into the right and left PA's.  Limited PA-angiogram.  Initiation of PE lysis.   Findings: Central pressure 47/9 (26)  Right catheter is 18cm infusion length, via the 67F cranial sheath Left catheter is 12cm infusion length, via the 67F caudal sheath.   Complications: None  Recommendations:  - Will initiate catheter directed lysis into the PA's.  Protocol is the 1mg /hour per catheter, for a total of 24mg  over 12hours.  - Continue low dose IV heparin via peripheral, per pharmacy, thank you - Q6 hour CBC, Fibrinogen, and heparin level - ICU admission while tPA is running - right leg straight while catheters are in - VIR will follow, with planned pressure check after 12 hour infusion is done.   Signed,  Dulcy Fanny. Earleen Newport, DO

## 2017-12-10 NOTE — Progress Notes (Signed)
ANTICOAGULATION CONSULT NOTE - Initial Consult  Pharmacy Consult for IV heparin Indication: pulmonary embolus  No Known Allergies  Patient Measurements: Weight: 203 lb 11.3 oz (92.4 kg) Heparin Dosing Weight: 66 kg  Vital Signs: Temp: 99 F (37.2 C) (02/24 2121) Temp Source: Rectal (02/24 2121) BP: 104/77 (02/25 0012) Pulse Rate: 83 (02/25 0015)  Labs: Recent Labs    12/09/17 1837  HGB 11.0*  HCT 33.6*  PLT 199  LABPROT 14.9  INR 1.18  CREATININE 1.24*    Estimated Creatinine Clearance: 50.7 mL/min (A) (by C-G formula based on SCr of 1.24 mg/dL (H)).   Medical History: Past Medical History:  Diagnosis Date  . GERD (gastroesophageal reflux disease)   . High cholesterol   . Hypertension   . Vertigo     Medications:  Scheduled:  . heparin  5,000 Units Intravenous Once   Infusions:  . heparin      Assessment: Kristi Meyers c/o weakness, fatigue and hypotension. IV heparin for large occlusive saddle PE with right heart strain.  Baselines: H/H=11/33.6, Plts=199, PT/INR=14.9/1.18, aptt pending  Goal of Therapy:  Heparin level 0.3-0.7 units/ml Monitor platelets by anticoagulation protocol: Yes   Plan:  Baseline aptt STAT Heparin 5000 unit bolus x1 now Start drip at 1200 units/hr Daily CBC/HL Check 1st HL in 6 hours  Kristi Meyers 12/10/2017,1:12 AM

## 2017-12-10 NOTE — Progress Notes (Signed)
ANTICOAGULATION CONSULT NOTE -Follow-Up Consult  Pharmacy Consult for IV heparin Indication: pulmonary embolus  No Known Allergies  Patient Measurements: Height: 5\' 2"  (157.5 cm) Weight: 199 lb 1.2 oz (90.3 kg) IBW/kg (Calculated) : 50.1 Heparin Dosing Weight: 66 kg  Vital Signs: Temp: 98.9 F (37.2 C) (02/25 1955) Temp Source: Oral (02/25 1955) BP: 106/74 (02/25 1800) Pulse Rate: 93 (02/25 1800)  Labs: Recent Labs    12/09/17 1837  12/10/17 0933 12/10/17 1438 12/10/17 2131  HGB 11.0*  --  9.6* 9.4* 9.3*  HCT 33.6*  --  30.2* 29.5* 29.2*  PLT 199  --  157 165 161  LABPROT 14.9  --   --   --   --   INR 1.18  --   --   --   --   HEPARINUNFRC  --    < > 0.73* 0.55 0.39  CREATININE 1.24*  --   --   --   --    < > = values in this interval not displayed.    Estimated Creatinine Clearance: 47.9 mL/min (A) (by C-G formula based on SCr of 1.24 mg/dL (H)).   Medical History: Past Medical History:  Diagnosis Date  . GERD (gastroesophageal reflux disease)   . High cholesterol   . Hypertension   . Vertigo     Medications:  Scheduled:  . famotidine  20 mg Oral BID  . sodium chloride flush  3 mL Intravenous Q12H   Infusions:  . sodium chloride    . sodium chloride 125 mL/hr at 12/10/17 0940  . sodium chloride    . sodium chloride 35 mL/hr at 12/10/17 1018  . sodium chloride 35 mL/hr at 12/10/17 1017  . sodium chloride    . sodium chloride    . alteplase (tPA/ ACTIVASE) PE Lysis 12 mg/250 mL (BILATERAL)    . heparin 1,000 Units/hr (12/10/17 1154)    Assessment: 51 yoF c/o weakness, fatigue and hypotension and found to have large occlusive saddle PE with right heart strain. Pharmacy consulted for heparin dosing.  Heparin level this evening is therapeutic after a rate reduction earlier today (HL 0.55 << 0.73, goal of 0.3-0.7). Fibrinogen is 379 and okay. Hgb/Hct stable from AM labs. Per RN report - pt currently has a nosebleed and they are attempting to stop. RN to  call IR and will contact pharmacy if anything is held.   Goal of Therapy:  Heparin level 0.3-0.7 units/ml Monitor platelets by anticoagulation protocol: Yes   Plan:  - Continue Heparin at 1000 units/hr (10 ml/hr) - Every 6 hour heparin, CBC and fibrinogen while on EKOS - Monitor EKOS therapy - Monitor for s/sx of bleeding   Thank you for allowing pharmacy to be a part of this patient's care.  Alycia Rossetti, PharmD, BCPS Clinical Pharmacist Pager: [phone] 12/10/2017 10:18 PM    --------------------------------------------------------------------------------------------- Addendum:   The patient's heparin level this evening remains therapeutic (HL 0.39 << 0.55, goal of 0.3-0.7). Pt continues to have nosebleeds but currently controlled with gauze packing. IR did not want to hold any drips at this time. Will continue to monitor. CBC is stable, fibrinogen 372  Plan - Continue Heparin at 1000 units/hr  - Will follow-up with a HL and fibrinogen in 6 hours - Will monitor nosebleeds and resolution  Alycia Rossetti, PharmD, BCPS Pager: [phone] 10:21 PM

## 2017-12-10 NOTE — Progress Notes (Signed)
Patient ID: Kristi Meyers, female   DOB: 1956/02/04, 62 y.o.   MRN: [id number]    Risks and benefits discussed with the patient including, but not limited to bleeding, possible life threatening bleeding and need for blood product transfusion, vascular injury, stroke, contrast induced renal failure, and infection.  All of the patient's questions were answered, patient is agreeable to proceed. Consent signed and in chart.  Dr Barbie Banner discussed procedure with pt and family. Good understanding and agreeable to proceed

## 2017-12-11 ENCOUNTER — Encounter (HOSPITAL_COMMUNITY): Payer: Self-pay | Admitting: Interventional Radiology

## 2017-12-11 ENCOUNTER — Other Ambulatory Visit: Payer: Self-pay

## 2017-12-11 ENCOUNTER — Inpatient Hospital Stay (HOSPITAL_COMMUNITY): Payer: BC Managed Care – PPO

## 2017-12-11 DIAGNOSIS — J9601 Acute respiratory failure with hypoxia: Secondary | ICD-10-CM

## 2017-12-11 DIAGNOSIS — I824Y2 Acute embolism and thrombosis of unspecified deep veins of left proximal lower extremity: Secondary | ICD-10-CM

## 2017-12-11 DIAGNOSIS — R040 Epistaxis: Secondary | ICD-10-CM

## 2017-12-11 HISTORY — PX: IR THROMB F/U EVAL ART/VEN FINAL DAY (MS): IMG5379

## 2017-12-11 LAB — BASIC METABOLIC PANEL
Anion gap: 5 (ref 5–15)
BUN: 9 mg/dL (ref 6–20)
CO2: 19 mmol/L — ABNORMAL LOW (ref 22–32)
Calcium: 6.5 mg/dL — ABNORMAL LOW (ref 8.9–10.3)
Chloride: 120 mmol/L — ABNORMAL HIGH (ref 101–111)
Creatinine, Ser: 0.64 mg/dL (ref 0.44–1.00)
GFR calc Af Amer: >60 mL/min (ref >60)
GFR calc non Af Amer: >60 mL/min (ref >60)
Glucose, Bld: 82 mg/dL (ref 65–99)
Potassium: 2.9 mmol/L — ABNORMAL LOW (ref 3.5–5.1)
Sodium: 144 mmol/L (ref 135–145)

## 2017-12-11 LAB — CBC
HCT: 25.9 % — ABNORMAL LOW (ref 36.0–46.0)
Hemoglobin: 8.3 g/dL — ABNORMAL LOW (ref 12.0–15.0)
MCH: 26.9 pg (ref 26.0–34.0)
MCHC: 32 g/dL (ref 30.0–36.0)
MCV: 84.1 fL (ref 78.0–100.0)
Platelets: 134 10*3/uL — ABNORMAL LOW (ref 150–400)
RBC: 3.08 MIL/uL — ABNORMAL LOW (ref 3.87–5.11)
RDW: 14.9 % (ref 11.5–15.5)
WBC: 11.6 10*3/uL — ABNORMAL HIGH (ref 4.0–10.5)

## 2017-12-11 LAB — HEPARIN LEVEL (UNFRACTIONATED): Heparin Unfractionated: 0.31 IU/mL (ref 0.30–0.70)

## 2017-12-11 LAB — MAGNESIUM: Magnesium: 1.4 mg/dL — ABNORMAL LOW (ref 1.7–2.4)

## 2017-12-11 LAB — GLUCOSE, CAPILLARY: Glucose-Capillary: 109 mg/dL — ABNORMAL HIGH (ref 65–99)

## 2017-12-11 LAB — PHOSPHORUS: Phosphorus: 1.4 mg/dL — ABNORMAL LOW (ref 2.5–4.6)

## 2017-12-11 LAB — FIBRINOGEN: Fibrinogen: 331 mg/dL (ref 210–475)

## 2017-12-11 MED ORDER — POTASSIUM CHLORIDE CRYS ER 20 MEQ PO TBCR
40.0000 meq | EXTENDED_RELEASE_TABLET | Freq: Three times a day (TID) | ORAL | Status: AC
  Administered 2017-12-11 (×2): 40 meq via ORAL
  Filled 2017-12-11 (×2): qty 2

## 2017-12-11 MED ORDER — ALBUTEROL SULFATE (2.5 MG/3ML) 0.083% IN NEBU
2.5000 mg | INHALATION_SOLUTION | RESPIRATORY_TRACT | Status: DC | PRN
  Administered 2017-12-11 (×2): 2.5 mg via RESPIRATORY_TRACT
  Filled 2017-12-11 (×3): qty 3

## 2017-12-11 MED ORDER — CARVEDILOL 6.25 MG PO TABS
6.2500 mg | ORAL_TABLET | Freq: Two times a day (BID) | ORAL | Status: DC
  Administered 2017-12-11 – 2017-12-12 (×3): 6.25 mg via ORAL
  Filled 2017-12-11 (×4): qty 1

## 2017-12-11 MED ORDER — MAGNESIUM SULFATE 2 GM/50ML IV SOLN
2.0000 g | Freq: Once | INTRAVENOUS | Status: AC
  Administered 2017-12-11: 2 g via INTRAVENOUS
  Filled 2017-12-11: qty 50

## 2017-12-11 MED ORDER — POTASSIUM PHOSPHATES 15 MMOLE/5ML IV SOLN
30.0000 mmol | Freq: Once | INTRAVENOUS | Status: AC
  Administered 2017-12-11: 30 mmol via INTRAVENOUS
  Filled 2017-12-11: qty 10

## 2017-12-11 MED ORDER — OXYMETAZOLINE HCL 0.05 % NA SOLN
2.0000 | Freq: Once | NASAL | Status: AC
  Administered 2017-12-11: 2 via NASAL
  Filled 2017-12-11: qty 15

## 2017-12-11 MED ORDER — FENTANYL CITRATE (PF) 100 MCG/2ML IJ SOLN
25.0000 ug | Freq: Once | INTRAMUSCULAR | Status: AC
  Administered 2017-12-11: 25 ug via INTRAVENOUS
  Filled 2017-12-11: qty 2

## 2017-12-11 MED ORDER — IPRATROPIUM-ALBUTEROL 0.5-2.5 (3) MG/3ML IN SOLN: 3.0000 mL | RESPIRATORY_TRACT | Status: DC | PRN

## 2017-12-11 MED ORDER — HEPARIN (PORCINE) IN NACL 100-0.45 UNIT/ML-% IJ SOLN
1150.0000 [IU]/h | INTRAMUSCULAR | Status: AC
  Administered 2017-12-11: 1000 [IU]/h via INTRAVENOUS
  Administered 2017-12-13: 1100 [IU]/h via INTRAVENOUS
  Administered 2017-12-14: 1150 [IU]/h via INTRAVENOUS
  Filled 2017-12-11 (×5): qty 250

## 2017-12-11 MED ORDER — MAGNESIUM SULFATE 2 GM/50ML IV SOLN
2.0000 g | Freq: Once | INTRAVENOUS | Status: AC
Start: 2017-12-11 — End: 2017-12-11
  Administered 2017-12-11: 2 g via INTRAVENOUS
  Filled 2017-12-11: qty 50

## 2017-12-11 NOTE — Progress Notes (Signed)
PCCM Interval Progress Note  Called to bedside to assess pt for hypoxia, minimally increased WOB, epistaxis.  Pt with large PE for which she had EKOS 2/25.  All tPA has stopped but still on heparin.  Having epistaxis from both nares, RN has packed with 2x2 gauze which has helped stop bleeding.  Was on 2L [place] but removed after bleeding started.  SpO2 in low 90's and WOB minimally elevated.  Suspect anterior bleed.  Will order 2 sprays Afrin in bilateral nares and continue nasal packing through the night.  Will also start venti mask to maintain SpO2 > 90%.  Will hold off on empiric abx for now as nasal packing is being changed throughout the night.  If gets additional deeper packing, might need to consider empiric abx coverage for TSS.  If epistaxis persists, will likely need ENT consult for possible cautery / further packing / further management.  RN to continue to monitor.   Montey Hora, [facility] Pager: [phone]  or [phone] 12/11/2017, 12:43 AM

## 2017-12-11 NOTE — Progress Notes (Signed)
eLink Physician-Brief Progress Note Patient Name: Kristi Meyers DOB: 20-Feb-1956 MRN: [id number]   Date of Service  12/11/2017  HPI/Events of Note  Patient c/o pain.   eICU Interventions  Will order Fentanyl 25 mcg IV X 1 now.      Intervention Category Intermediate Interventions: Pain - evaluation and management  Sommer,Steven Cornelia Copa 12/11/2017, 11:34 PM

## 2017-12-11 NOTE — Progress Notes (Addendum)
[place] for IV heparin Indication: pulmonary embolus  Patient Measurements: Height: 5\' 2"  (157.5 cm) Weight: 201 lb 1 oz (91.2 kg) IBW/kg (Calculated) : 50.1 Heparin Dosing Weight: 66 kg  Vital Signs: Temp: 99.1 F (37.3 C) (02/26 0721) Temp Source: Axillary (02/26 0721) BP: 162/89 (02/26 0700) Pulse Rate: 102 (02/26 0700)  Labs: Recent Labs    12/09/17 1837  12/10/17 1438 12/10/17 2131 12/11/17 0337 12/11/17 0423  HGB 11.0*   < > 9.4* 9.3* 8.3*  --   HCT 33.6*   < > 29.5* 29.2* 25.9*  --   PLT 199   < > 165 161 134*  --   LABPROT 14.9  --   --   --   --   --   INR 1.18  --   --   --   --   --   HEPARINUNFRC  --    < > 0.55 0.39 0.31  --   CREATININE 1.24*  --   --   --   --  0.64   < > = values in this interval not displayed.    Medical History: Past Medical History:  Diagnosis Date  . GERD (gastroesophageal reflux disease)   . High cholesterol   . Hypertension   . Vertigo     Medications:  Scheduled:  . famotidine  20 mg Oral BID  . sodium chloride flush  3 mL Intravenous Q12H   Infusions:  . sodium chloride    . sodium chloride 125 mL/hr at 12/10/17 0940  . sodium chloride    . sodium chloride 35 mL/hr at 12/10/17 1018  . sodium chloride 35 mL/hr at 12/10/17 1017  . sodium chloride 15 mL/hr at 12/10/17 2300  . sodium chloride 15 mL/hr at 12/10/17 2300  . heparin 1,000 Units/hr (12/10/17 2314)  . magnesium sulfate 1 - 4 g bolus IVPB 2 g (12/11/17 0754)  . potassium PHOSPHATE IVPB (mmol) 30 mmol (12/11/17 0754)    Assessment: 66 yoF c/o weakness, fatigue and hypotension and found to have large occlusive saddle PE with right heart strain. Pharmacy consulted for heparin dosing.  Heparin level is therapeutic this morning on low end of goal. Catheter-directed alteplase completed around 2200 yesterday. Nosebleeds are ongoing but they are not compromising the patient's airway.  hgb is down from admission 11 > 8.3,  fibrinogen is WNL. Patient is now experiencing bloody tears as well.    Goal of Therapy:  Heparin level 0.3-0.7 units/ml Monitor platelets by anticoagulation protocol: Yes   Plan:  - Continue Heparin at 1000 units/hr - Recheck heparin level this afternoon to assure heparin level still therapeutic - Daily HL, CBC - Monitor for s/sx of bleeding    Jimmy Footman, PharmD, BCPS PGY2 Infectious Diseases Pharmacy Resident Pager: [phone]  12/11/2017 8:27 AM   Addendum: Nurse reports patient actively bleeding from left eye. Will hold heparin for 6 hours and follow-up with nurse this evening to monitor bleeding.   Jimmy Footman, PharmD, BCPS PGY2 Infectious Diseases Pharmacy Resident Pager: [phone]

## 2017-12-11 NOTE — Progress Notes (Addendum)
PULMONARY / CRITICAL CARE MEDICINE   Name: Kristi Meyers MRN: [id number] DOB: 03/09/1956    ADMISSION DATE:  12/09/2017 CONSULTATION DATE:  12/09/17  REFERRING MD:  Tegeler  CHIEF COMPLAINT:  SOB, Shock  HISTORY OF PRESENT ILLNESS:    62yoF with history of HTN, HLD, and GERD, presents to the ER tonight c/o SOB, Dizzyness, and Generalized weakness x 2 days. She also c/o constipation. She denies Cough, CP, Fever, or LE edema. In the ER she was found to be in shock with BP 70/42, which has since improved to 104/77 after 2L IVF. She reports her SBP as an outpatient is typically in 170's. CTA revealed bilateral PE's in right and left main PA's as well as saddle embolus and DVT within the IVF at the hepatic level. She was started on a Heparin gtt.   She has not had any recent surgeries (only surgery ever was cholecystectomy 9 years ago). Denies history of CVA or GIB or head trauma. Denies any recent travel (car or plane) or being bed bound. Denies any other family members having had DVT's or PE's. Denies any history of malignancy.   PAST MEDICAL HISTORY :  She  has a past medical history of GERD (gastroesophageal reflux disease), High cholesterol, Hypertension, and Vertigo.  PAST SURGICAL HISTORY: She  has a past surgical history that includes Cholecystectomy; IR US Guide Vasc Access Right (12/10/2017); IR INFUSION THROMBOL ARTERIAL INITIAL (MS) (12/10/2017); IR Angiogram Selective Each Additional Vessel (12/10/2017); IR Angiogram Pulmonary Bilateral Selective (12/10/2017); IR Angiogram Selective Each Additional Vessel (12/10/2017); and IR INFUSION THROMBOL ARTERIAL INITIAL (MS) (12/10/2017).  No Known Allergies  No current facility-administered medications on file prior to encounter.    Current Outpatient Medications on File Prior to Encounter  Medication Sig  . atorvastatin (LIPITOR) 40 MG tablet Take 40 mg by mouth daily.  . carvedilol (COREG) 25 MG tablet Take 25 mg by mouth 2 (two) times  daily with a meal.  . meclizine (ANTIVERT) 12.5 MG tablet Take 12.5 mg by mouth 2 (two) times daily.  . Olmesartan-Amlodipine-HCTZ 40-10-25 MG TABS Take 1 tablet by mouth daily.  . potassium chloride SA (K-DUR,KLOR-CON) 20 MEQ tablet Take 20 mEq by mouth daily.  . traMADol (ULTRAM) 50 MG tablet Take 1 tablet (50 mg total) by mouth every 8 (eight) hours as needed for severe pain.  Marland Kitchen diclofenac sodium (VOLTAREN) 1 % GEL Apply 2 g topically 3 (three) times daily as needed. (Patient not taking: Reported on 12/10/2017)    FAMILY HISTORY:  Her has no family status information on file.    SOCIAL HISTORY: She  reports that she has been smoking cigarettes.  she has never used smokeless tobacco. She reports that she does not drink alcohol or use drugs.  REVIEW OF SYSTEMS:   Denies CP, SOB.   SUBJECTIVE:  Overnight had nose bleeding managed w/ anterior packing and afrin. She was found to have RLE DVT. She is uncomfortable currently w/ nose packing in place, venturi mask on  VITAL SIGNS: BP (!) 157/98   Pulse (!) 104   Temp 99.1 F (37.3 C) (Axillary)   Resp (!) 23   Ht 5\' 2"  (1.575 m)   Wt 201 lb 1 oz (91.2 kg)   LMP 11/17/2008 (Approximate)   SpO2 99%   BMI 36.77 kg/m   HEMODYNAMICS:    VENTILATOR SETTINGS: FiO2 (%):  [31 %] 31 %  INTAKE / OUTPUT: I/O last 3 completed shifts: In: 8942.1 [I.V.:6942.1; IV Piggyback:2000] Out: 2575 [  Urine:2575]  PHYSICAL EXAMINATION: General: elderly AA female laying in bed in NAD, venturi mask in place Neuro: AAOx3, moving all extremities, no focal deficits HEENT: venturi mask in place, nares packed with gauze Cardiovascular: RRR no m/r/g Lungs: CTA b/l Abdomen: Soft NTND Musculoskeletal: Trace BLE edema; both legs appear roughly equal in size. Skin: no rashes, skin in tact  LABS:  BMET Recent Labs  Lab 12/09/17 1837 12/11/17 0423  NA 141 144  K 4.1 2.9*  CL 110 120*  CO2 22 19*  BUN 22* 9  CREATININE 1.24* 0.64  GLUCOSE 130* 82     Electrolytes Recent Labs  Lab 12/09/17 1837 12/11/17 0423  CALCIUM 8.3* 6.5*  MG 2.0 1.4*  PHOS  --  1.4*    CBC Recent Labs  Lab 12/10/17 1438 12/10/17 2131 12/11/17 0337  WBC 10.6* 11.7* 11.6*  HGB 9.4* 9.3* 8.3*  HCT 29.5* 29.2* 25.9*  PLT 165 161 134*   Coag's Recent Labs  Lab 12/09/17 1837  INR 1.18   Sepsis Markers Recent Labs  Lab 12/09/17 1845 12/09/17 2106  LATICACIDVEN 1.43 1.25   ABG No results for input(s): PHART, PCO2ART, PO2ART in the last 168 hours.  Liver Enzymes Recent Labs  Lab 12/09/17 1837  AST 15  ALT 16  ALKPHOS 71  BILITOT 0.8  ALBUMIN 2.9*   Cardiac Enzymes No results for input(s): TROPONINI, PROBNP in the last 168 hours.  Glucose Recent Labs  Lab 12/10/17 0422 12/10/17 0717  GLUCAP 85 73    Imaging Ir Angiogram Pulmonary Bilateral Selective  Result Date: 12/10/2017 INDICATION: 62 year old female presents with submassive pulmonary embolism EXAM: ULTRASOUND GUIDED ACCESS RIGHT COMMON FEMORAL VEIN TIMES TO ACCESS PULMONARY ANGIOGRAM PLACEMENT OF ULTRASOUND ACCELERATED LYSIS INFUSION CATHETERS FOR PE LYSIS COMPARISON:  CT 12/10/2017 MEDICATIONS: None. ANESTHESIA/SEDATION: Versed 2.0 mg IV; Fentanyl 50 mcg IV Moderate Sedation Time:  44 minutes The patient was continuously monitored during the procedure by the interventional radiology nurse under my direct supervision. FLUOROSCOPY TIME:  Fluoroscopy Time: 9 minutes 36 seconds (73 mGy). COMPLICATIONS: None TECHNIQUE: Informed consent was obtained from the patient and the patient's family following explanation of the procedure, risks, benefits and alternatives. Specific risks include bleeding, infection, contrast reaction, kidney injury, venous injury, life-threatening hemorrhage including brain hemorrhage, gastrointestinal hemorrhage, epistaxis, need for further surgery, need for further procedure, cardiopulmonary collapse, death. The patient understands, agrees and consents for  the procedure. All questions were addressed. Patient is position supine position on the fluoroscopy table. Maximal barrier sterile technique utilized including caps, mask, sterile gowns, sterile gloves, large sterile drape, hand hygiene, and betadine prep. 1% lidocaine used for local anesthesia. Moderate sedation was provided. Ultrasound survey of the right inguinal region was performed with images stored and sent to PACs. A single wall needle was used access the right common femoral vein under ultrasound. With venous blood flow returned, an 035 wire was passed through the needle into the IVC. A 6 French sheath was placed over the wire. The dilator was removed and the sheath was flushed. Subsequently, a single wall needle was used access the right common femoral vein adjacent to the initial puncture. With venous blood flow returned, a 6 [place] vascular sheath was placed over the wire. The dilator was removed and the sheath was flushed. Combination of a 5 French pigtail catheter, vertebral catheter and a Bentson wire was then used to select the main pulmonary artery. Bentson wire, Glidewire, and vertebral catheter was then used to navigate into the right-sided pulmonary  veins, with selection of lower lobe pulmonary vein. Glidewire was used to advance into a more distal position and the vertebral catheter was placed into medial segments. Vertebral catheter was removed and a 18 cm infusion length EKOS ultrasound accelerated catheter was placed over the Glidewire. Small contrast infusion confirmed position. Catheter was flushed. Combination of a 5 French pigtail catheter, vertebral catheter and a Bentson wire was then used to select the main pulmonary artery through the second sheath. Wire was removed and a main pulmonary artery pressure transduction was recorded. Once the catheter was positioned into the lower lobar branches, a 12 cm infusion length EKOS catheter was placed over a Rosen wire. Wire was removed, small  amount of contrast confirmed position and catheter was flushed. The electronic wires were then placed through both the left and right catheters. Sheaths were secured in position.  Final image was stored. The patient tolerated the procedure well and remained hemodynamically stable throughout. No complications were encountered and no significant blood loss was encountered. FINDINGS: Ultrasound survey demonstrates patent right common femoral vein. Main pulmonary artery pressure measures 47/9 (26). Final image demonstrates right-sided catheter, 18 cm infusion length, directed into lower lobar branches and left sided catheter, 12 cm infusion length, directed into lower lobar branches. The cranial 6 French (green) sheaths transmits the right-sided catheter. The caudal 6 French (green) sheath transmits the left-sided pulmonary artery catheter. IMPRESSION: Status post placement of left and right pulmonary artery EKOS ultrasound accelerated catheters for initiation of catheter directed thrombolysis. Signed, Dulcy Fanny. Earleen Newport DO [facility] Radiology PLAN: Patient will be ICU status throughout. Bed rest. Both the right and left catheter will receive 1 mg tPA per hour for 12 hours, for a total dose of 24 mg over 12 hours. Plan for repeat trans duction of catheter pressures after treatment, reassessment, and probable removal of catheters. EVery 6 our blood draw, including CBC, heparin level, and fibrinogen. Electronically Signed   By: Corrie Mckusick D.O.   On: 12/10/2017 10:14   Ir Angiogram Selective Each Additional Vessel  Result Date: 12/10/2017 INDICATION: 62 year old female presents with submassive pulmonary embolism EXAM: ULTRASOUND GUIDED ACCESS RIGHT COMMON FEMORAL VEIN TIMES TO ACCESS PULMONARY ANGIOGRAM PLACEMENT OF ULTRASOUND ACCELERATED LYSIS INFUSION CATHETERS FOR PE LYSIS COMPARISON:  CT 12/10/2017 MEDICATIONS: None. ANESTHESIA/SEDATION: Versed 2.0 mg IV; Fentanyl  50 mcg IV Moderate Sedation Time:  44 minutes The patient was continuously monitored during the procedure by the interventional radiology nurse under my direct supervision. FLUOROSCOPY TIME:  Fluoroscopy Time: 9 minutes 36 seconds (73 mGy). COMPLICATIONS: None TECHNIQUE: Informed consent was obtained from the patient and the patient's family following explanation of the procedure, risks, benefits and alternatives. Specific risks include bleeding, infection, contrast reaction, kidney injury, venous injury, life-threatening hemorrhage including brain hemorrhage, gastrointestinal hemorrhage, epistaxis, need for further surgery, need for further procedure, cardiopulmonary collapse, death. The patient understands, agrees and consents for the procedure. All questions were addressed. Patient is position supine position on the fluoroscopy table. Maximal barrier sterile technique utilized including caps, mask, sterile gowns, sterile gloves, large sterile drape, hand hygiene, and betadine prep. 1% lidocaine used for local anesthesia. Moderate sedation was provided. Ultrasound survey of the right inguinal region was performed with images stored and sent to PACs. A single wall needle was used access the right common femoral vein under ultrasound. With venous blood flow returned, an 035 wire was passed through the needle into the IVC. A 6 French sheath was placed over the wire.  The dilator was removed and the sheath was flushed. Subsequently, a single wall needle was used access the right common femoral vein adjacent to the initial puncture. With venous blood flow returned, a 6 [place] vascular sheath was placed over the wire. The dilator was removed and the sheath was flushed. Combination of a 5 French pigtail catheter, vertebral catheter and a Bentson wire was then used to select the main pulmonary artery. Bentson wire, Glidewire, and vertebral catheter was then used to navigate into the right-sided pulmonary veins, with  selection of lower lobe pulmonary vein. Glidewire was used to advance into a more distal position and the vertebral catheter was placed into medial segments. Vertebral catheter was removed and a 18 cm infusion length EKOS ultrasound accelerated catheter was placed over the Glidewire. Small contrast infusion confirmed position. Catheter was flushed. Combination of a 5 French pigtail catheter, vertebral catheter and a Bentson wire was then used to select the main pulmonary artery through the second sheath. Wire was removed and a main pulmonary artery pressure transduction was recorded. Once the catheter was positioned into the lower lobar branches, a 12 cm infusion length EKOS catheter was placed over a Rosen wire. Wire was removed, small amount of contrast confirmed position and catheter was flushed. The electronic wires were then placed through both the left and right catheters. Sheaths were secured in position.  Final image was stored. The patient tolerated the procedure well and remained hemodynamically stable throughout. No complications were encountered and no significant blood loss was encountered. FINDINGS: Ultrasound survey demonstrates patent right common femoral vein. Main pulmonary artery pressure measures 47/9 (26). Final image demonstrates right-sided catheter, 18 cm infusion length, directed into lower lobar branches and left sided catheter, 12 cm infusion length, directed into lower lobar branches. The cranial 6 French (green) sheaths transmits the right-sided catheter. The caudal 6 French (green) sheath transmits the left-sided pulmonary artery catheter. IMPRESSION: Status post placement of left and right pulmonary artery EKOS ultrasound accelerated catheters for initiation of catheter directed thrombolysis. Signed, Dulcy Fanny. Earleen Newport DO [facility] Radiology PLAN: Patient will be ICU status throughout. Bed rest. Both the right and left catheter will  receive 1 mg tPA per hour for 12 hours, for a total dose of 24 mg over 12 hours. Plan for repeat trans duction of catheter pressures after treatment, reassessment, and probable removal of catheters. EVery 6 our blood draw, including CBC, heparin level, and fibrinogen. Electronically Signed   By: Corrie Mckusick D.O.   On: 12/10/2017 10:14   Ir Angiogram Selective Each Additional Vessel  Result Date: 12/10/2017 INDICATION: 62 year old female presents with submassive pulmonary embolism EXAM: ULTRASOUND GUIDED ACCESS RIGHT COMMON FEMORAL VEIN TIMES TO ACCESS PULMONARY ANGIOGRAM PLACEMENT OF ULTRASOUND ACCELERATED LYSIS INFUSION CATHETERS FOR PE LYSIS COMPARISON:  CT 12/10/2017 MEDICATIONS: None. ANESTHESIA/SEDATION: Versed 2.0 mg IV; Fentanyl 50 mcg IV Moderate Sedation Time:  44 minutes The patient was continuously monitored during the procedure by the interventional radiology nurse under my direct supervision. FLUOROSCOPY TIME:  Fluoroscopy Time: 9 minutes 36 seconds (73 mGy). COMPLICATIONS: None TECHNIQUE: Informed consent was obtained from the patient and the patient's family following explanation of the procedure, risks, benefits and alternatives. Specific risks include bleeding, infection, contrast reaction, kidney injury, venous injury, life-threatening hemorrhage including brain hemorrhage, gastrointestinal hemorrhage, epistaxis, need for further surgery, need for further procedure, cardiopulmonary collapse, death. The patient understands, agrees and consents for the procedure. All questions were addressed. Patient is position supine position on  the fluoroscopy table. Maximal barrier sterile technique utilized including caps, mask, sterile gowns, sterile gloves, large sterile drape, hand hygiene, and betadine prep. 1% lidocaine used for local anesthesia. Moderate sedation was provided. Ultrasound survey of the right inguinal region was performed with images stored and sent to PACs. A single wall needle was  used access the right common femoral vein under ultrasound. With venous blood flow returned, an 035 wire was passed through the needle into the IVC. A 6 French sheath was placed over the wire. The dilator was removed and the sheath was flushed. Subsequently, a single wall needle was used access the right common femoral vein adjacent to the initial puncture. With venous blood flow returned, a 6 [place] vascular sheath was placed over the wire. The dilator was removed and the sheath was flushed. Combination of a 5 French pigtail catheter, vertebral catheter and a Bentson wire was then used to select the main pulmonary artery. Bentson wire, Glidewire, and vertebral catheter was then used to navigate into the right-sided pulmonary veins, with selection of lower lobe pulmonary vein. Glidewire was used to advance into a more distal position and the vertebral catheter was placed into medial segments. Vertebral catheter was removed and a 18 cm infusion length EKOS ultrasound accelerated catheter was placed over the Glidewire. Small contrast infusion confirmed position. Catheter was flushed. Combination of a 5 French pigtail catheter, vertebral catheter and a Bentson wire was then used to select the main pulmonary artery through the second sheath. Wire was removed and a main pulmonary artery pressure transduction was recorded. Once the catheter was positioned into the lower lobar branches, a 12 cm infusion length EKOS catheter was placed over a Rosen wire. Wire was removed, small amount of contrast confirmed position and catheter was flushed. The electronic wires were then placed through both the left and right catheters. Sheaths were secured in position.  Final image was stored. The patient tolerated the procedure well and remained hemodynamically stable throughout. No complications were encountered and no significant blood loss was encountered. FINDINGS: Ultrasound survey demonstrates patent right common femoral vein. Main  pulmonary artery pressure measures 47/9 (26). Final image demonstrates right-sided catheter, 18 cm infusion length, directed into lower lobar branches and left sided catheter, 12 cm infusion length, directed into lower lobar branches. The cranial 6 French (green) sheaths transmits the right-sided catheter. The caudal 6 French (green) sheath transmits the left-sided pulmonary artery catheter. IMPRESSION: Status post placement of left and right pulmonary artery EKOS ultrasound accelerated catheters for initiation of catheter directed thrombolysis. Signed, Dulcy Fanny. Earleen Newport DO [facility] Radiology PLAN: Patient will be ICU status throughout. Bed rest. Both the right and left catheter will receive 1 mg tPA per hour for 12 hours, for a total dose of 24 mg over 12 hours. Plan for repeat trans duction of catheter pressures after treatment, reassessment, and probable removal of catheters. EVery 6 our blood draw, including CBC, heparin level, and fibrinogen. Electronically Signed   By: Corrie Mckusick D.O.   On: 12/10/2017 10:14   Ir US Guide Vasc Access Right  Result Date: 12/10/2017 INDICATION: 62 year old female presents with submassive pulmonary embolism EXAM: ULTRASOUND GUIDED ACCESS RIGHT COMMON FEMORAL VEIN TIMES TO ACCESS PULMONARY ANGIOGRAM PLACEMENT OF ULTRASOUND ACCELERATED LYSIS INFUSION CATHETERS FOR PE LYSIS COMPARISON:  CT 12/10/2017 MEDICATIONS: None. ANESTHESIA/SEDATION: Versed 2.0 mg IV; Fentanyl 50 mcg IV Moderate Sedation Time:  44 minutes The patient was continuously monitored during the procedure by the interventional radiology  nurse under my direct supervision. FLUOROSCOPY TIME:  Fluoroscopy Time: 9 minutes 36 seconds (73 mGy). COMPLICATIONS: None TECHNIQUE: Informed consent was obtained from the patient and the patient's family following explanation of the procedure, risks, benefits and alternatives. Specific risks include bleeding, infection,  contrast reaction, kidney injury, venous injury, life-threatening hemorrhage including brain hemorrhage, gastrointestinal hemorrhage, epistaxis, need for further surgery, need for further procedure, cardiopulmonary collapse, death. The patient understands, agrees and consents for the procedure. All questions were addressed. Patient is position supine position on the fluoroscopy table. Maximal barrier sterile technique utilized including caps, mask, sterile gowns, sterile gloves, large sterile drape, hand hygiene, and betadine prep. 1% lidocaine used for local anesthesia. Moderate sedation was provided. Ultrasound survey of the right inguinal region was performed with images stored and sent to PACs. A single wall needle was used access the right common femoral vein under ultrasound. With venous blood flow returned, an 035 wire was passed through the needle into the IVC. A 6 French sheath was placed over the wire. The dilator was removed and the sheath was flushed. Subsequently, a single wall needle was used access the right common femoral vein adjacent to the initial puncture. With venous blood flow returned, a 6 [place] vascular sheath was placed over the wire. The dilator was removed and the sheath was flushed. Combination of a 5 French pigtail catheter, vertebral catheter and a Bentson wire was then used to select the main pulmonary artery. Bentson wire, Glidewire, and vertebral catheter was then used to navigate into the right-sided pulmonary veins, with selection of lower lobe pulmonary vein. Glidewire was used to advance into a more distal position and the vertebral catheter was placed into medial segments. Vertebral catheter was removed and a 18 cm infusion length EKOS ultrasound accelerated catheter was placed over the Glidewire. Small contrast infusion confirmed position. Catheter was flushed. Combination of a 5 French pigtail catheter, vertebral catheter and a Bentson wire was then used to select the main  pulmonary artery through the second sheath. Wire was removed and a main pulmonary artery pressure transduction was recorded. Once the catheter was positioned into the lower lobar branches, a 12 cm infusion length EKOS catheter was placed over a Rosen wire. Wire was removed, small amount of contrast confirmed position and catheter was flushed. The electronic wires were then placed through both the left and right catheters. Sheaths were secured in position.  Final image was stored. The patient tolerated the procedure well and remained hemodynamically stable throughout. No complications were encountered and no significant blood loss was encountered. FINDINGS: Ultrasound survey demonstrates patent right common femoral vein. Main pulmonary artery pressure measures 47/9 (26). Final image demonstrates right-sided catheter, 18 cm infusion length, directed into lower lobar branches and left sided catheter, 12 cm infusion length, directed into lower lobar branches. The cranial 6 French (green) sheaths transmits the right-sided catheter. The caudal 6 French (green) sheath transmits the left-sided pulmonary artery catheter. IMPRESSION: Status post placement of left and right pulmonary artery EKOS ultrasound accelerated catheters for initiation of catheter directed thrombolysis. Signed, Dulcy Fanny. Earleen Newport DO [facility] Radiology PLAN: Patient will be ICU status throughout. Bed rest. Both the right and left catheter will receive 1 mg tPA per hour for 12 hours, for a total dose of 24 mg over 12 hours. Plan for repeat trans duction of catheter pressures after treatment, reassessment, and probable removal of catheters. EVery 6 our blood draw, including CBC, heparin level, and fibrinogen. Electronically Signed  By: Corrie Mckusick D.O.   On: 12/10/2017 10:14   Ir Infusion Thrombol Arterial Initial (ms)  Result Date: 12/10/2017 INDICATION: 62 year old female presents with submassive  pulmonary embolism EXAM: ULTRASOUND GUIDED ACCESS RIGHT COMMON FEMORAL VEIN TIMES TO ACCESS PULMONARY ANGIOGRAM PLACEMENT OF ULTRASOUND ACCELERATED LYSIS INFUSION CATHETERS FOR PE LYSIS COMPARISON:  CT 12/10/2017 MEDICATIONS: None. ANESTHESIA/SEDATION: Versed 2.0 mg IV; Fentanyl 50 mcg IV Moderate Sedation Time:  44 minutes The patient was continuously monitored during the procedure by the interventional radiology nurse under my direct supervision. FLUOROSCOPY TIME:  Fluoroscopy Time: 9 minutes 36 seconds (73 mGy). COMPLICATIONS: None TECHNIQUE: Informed consent was obtained from the patient and the patient's family following explanation of the procedure, risks, benefits and alternatives. Specific risks include bleeding, infection, contrast reaction, kidney injury, venous injury, life-threatening hemorrhage including brain hemorrhage, gastrointestinal hemorrhage, epistaxis, need for further surgery, need for further procedure, cardiopulmonary collapse, death. The patient understands, agrees and consents for the procedure. All questions were addressed. Patient is position supine position on the fluoroscopy table. Maximal barrier sterile technique utilized including caps, mask, sterile gowns, sterile gloves, large sterile drape, hand hygiene, and betadine prep. 1% lidocaine used for local anesthesia. Moderate sedation was provided. Ultrasound survey of the right inguinal region was performed with images stored and sent to PACs. A single wall needle was used access the right common femoral vein under ultrasound. With venous blood flow returned, an 035 wire was passed through the needle into the IVC. A 6 French sheath was placed over the wire. The dilator was removed and the sheath was flushed. Subsequently, a single wall needle was used access the right common femoral vein adjacent to the initial puncture. With venous blood flow returned, a 6 [place] vascular sheath was placed over the wire. The dilator was removed and  the sheath was flushed. Combination of a 5 French pigtail catheter, vertebral catheter and a Bentson wire was then used to select the main pulmonary artery. Bentson wire, Glidewire, and vertebral catheter was then used to navigate into the right-sided pulmonary veins, with selection of lower lobe pulmonary vein. Glidewire was used to advance into a more distal position and the vertebral catheter was placed into medial segments. Vertebral catheter was removed and a 18 cm infusion length EKOS ultrasound accelerated catheter was placed over the Glidewire. Small contrast infusion confirmed position. Catheter was flushed. Combination of a 5 French pigtail catheter, vertebral catheter and a Bentson wire was then used to select the main pulmonary artery through the second sheath. Wire was removed and a main pulmonary artery pressure transduction was recorded. Once the catheter was positioned into the lower lobar branches, a 12 cm infusion length EKOS catheter was placed over a Rosen wire. Wire was removed, small amount of contrast confirmed position and catheter was flushed. The electronic wires were then placed through both the left and right catheters. Sheaths were secured in position.  Final image was stored. The patient tolerated the procedure well and remained hemodynamically stable throughout. No complications were encountered and no significant blood loss was encountered. FINDINGS: Ultrasound survey demonstrates patent right common femoral vein. Main pulmonary artery pressure measures 47/9 (26). Final image demonstrates right-sided catheter, 18 cm infusion length, directed into lower lobar branches and left sided catheter, 12 cm infusion length, directed into lower lobar branches. The cranial 6 French (green) sheaths transmits the right-sided catheter. The caudal 6 French (green) sheath transmits the left-sided pulmonary artery catheter. IMPRESSION: Status post placement of left and right pulmonary artery  EKOS  ultrasound accelerated catheters for initiation of catheter directed thrombolysis. Signed, Dulcy Fanny. Earleen Newport DO [facility] Radiology PLAN: Patient will be ICU status throughout. Bed rest. Both the right and left catheter will receive 1 mg tPA per hour for 12 hours, for a total dose of 24 mg over 12 hours. Plan for repeat trans duction of catheter pressures after treatment, reassessment, and probable removal of catheters. EVery 6 our blood draw, including CBC, heparin level, and fibrinogen. Electronically Signed   By: Corrie Mckusick D.O.   On: 12/10/2017 10:14   Ir Infusion Thrombol Arterial Initial (ms)  Result Date: 12/10/2017 INDICATION: 62 year old female presents with submassive pulmonary embolism EXAM: ULTRASOUND GUIDED ACCESS RIGHT COMMON FEMORAL VEIN TIMES TO ACCESS PULMONARY ANGIOGRAM PLACEMENT OF ULTRASOUND ACCELERATED LYSIS INFUSION CATHETERS FOR PE LYSIS COMPARISON:  CT 12/10/2017 MEDICATIONS: None. ANESTHESIA/SEDATION: Versed 2.0 mg IV; Fentanyl 50 mcg IV Moderate Sedation Time:  44 minutes The patient was continuously monitored during the procedure by the interventional radiology nurse under my direct supervision. FLUOROSCOPY TIME:  Fluoroscopy Time: 9 minutes 36 seconds (73 mGy). COMPLICATIONS: None TECHNIQUE: Informed consent was obtained from the patient and the patient's family following explanation of the procedure, risks, benefits and alternatives. Specific risks include bleeding, infection, contrast reaction, kidney injury, venous injury, life-threatening hemorrhage including brain hemorrhage, gastrointestinal hemorrhage, epistaxis, need for further surgery, need for further procedure, cardiopulmonary collapse, death. The patient understands, agrees and consents for the procedure. All questions were addressed. Patient is position supine position on the fluoroscopy table. Maximal barrier sterile technique utilized including caps, mask, sterile  gowns, sterile gloves, large sterile drape, hand hygiene, and betadine prep. 1% lidocaine used for local anesthesia. Moderate sedation was provided. Ultrasound survey of the right inguinal region was performed with images stored and sent to PACs. A single wall needle was used access the right common femoral vein under ultrasound. With venous blood flow returned, an 035 wire was passed through the needle into the IVC. A 6 French sheath was placed over the wire. The dilator was removed and the sheath was flushed. Subsequently, a single wall needle was used access the right common femoral vein adjacent to the initial puncture. With venous blood flow returned, a 6 [place] vascular sheath was placed over the wire. The dilator was removed and the sheath was flushed. Combination of a 5 French pigtail catheter, vertebral catheter and a Bentson wire was then used to select the main pulmonary artery. Bentson wire, Glidewire, and vertebral catheter was then used to navigate into the right-sided pulmonary veins, with selection of lower lobe pulmonary vein. Glidewire was used to advance into a more distal position and the vertebral catheter was placed into medial segments. Vertebral catheter was removed and a 18 cm infusion length EKOS ultrasound accelerated catheter was placed over the Glidewire. Small contrast infusion confirmed position. Catheter was flushed. Combination of a 5 French pigtail catheter, vertebral catheter and a Bentson wire was then used to select the main pulmonary artery through the second sheath. Wire was removed and a main pulmonary artery pressure transduction was recorded. Once the catheter was positioned into the lower lobar branches, a 12 cm infusion length EKOS catheter was placed over a Rosen wire. Wire was removed, small amount of contrast confirmed position and catheter was flushed. The electronic wires were then placed through both the left and right catheters. Sheaths were secured in position.   Final image was stored. The patient tolerated the procedure well and remained  hemodynamically stable throughout. No complications were encountered and no significant blood loss was encountered. FINDINGS: Ultrasound survey demonstrates patent right common femoral vein. Main pulmonary artery pressure measures 47/9 (26). Final image demonstrates right-sided catheter, 18 cm infusion length, directed into lower lobar branches and left sided catheter, 12 cm infusion length, directed into lower lobar branches. The cranial 6 French (green) sheaths transmits the right-sided catheter. The caudal 6 French (green) sheath transmits the left-sided pulmonary artery catheter. IMPRESSION: Status post placement of left and right pulmonary artery EKOS ultrasound accelerated catheters for initiation of catheter directed thrombolysis. Signed, Dulcy Fanny. Earleen Newport DO [facility] Radiology PLAN: Patient will be ICU status throughout. Bed rest. Both the right and left catheter will receive 1 mg tPA per hour for 12 hours, for a total dose of 24 mg over 12 hours. Plan for repeat trans duction of catheter pressures after treatment, reassessment, and probable removal of catheters. EVery 6 our blood draw, including CBC, heparin level, and fibrinogen. Electronically Signed   By: Corrie Mckusick D.O.   On: 12/10/2017 10:14   STUDIES:  CTA 2/25 > Positive for acute PE with CT evidence of right heart strain (RV/LV Ratio = 1.74) consistent with at least submassive (intermediate risk) PE.  CT venogram 2/25 > no thrombus in IVC LE Dopplers 2/25 > LLE DVT, no RLE clot seen Echo 2/25 > could not obtain  CULTURES: none  ANTIBIOTICS: none  SIGNIFICANT EVENTS: 2/23 PM: presented to [facility] ER with shock, sob, and weakness, found to have massive PE with saddle embolus. BP improved with IVF's but remained soft.  2/25 tPA running per IR 2/25 evening- nosebleed  LINES/TUBES: PIV Foley  2/25  DISCUSSION: 62yoF with history of HTN, HLD, and GERD, presents to the ER tonight c/o SOB, Dizzyness, and Generalized weakness x 2 days, found to have massive PE with large bilateral clot burden and saddle embolus.  ASSESSMENT / PLAN:  PULMONARY A: Massive PE P:   Continue heparin drip Reorder TTE  CARDIOVASCULAR A:  Shock- resolved w/ fluid resuscitation Elevated troponin- likely 2/2 demand P:  Monitor on tele Discontinue MIVF Hold home BP meds, restart as able Echo as above  RENAL A:   AKI - resolved P:   Follow BMP  GASTROINTESTINAL A:   Hx gerd P:   Continue H2 blocker BID HH diet  HEMATOLOGIC A:   Massive PE Nosebleed P:  Continue Heparin drip Monitor CBC, heparin level, fibrinogen q6 hrs per IR Continue packing nares, afrin spray. May need ENT eval for cautery/further management  INFECTIOUS A:   No issues P:   Monitor for fevers, leukocytosis  ENDOCRINE A:   No issues P:   Monitor CBGs  NEUROLOGIC A:   No issues P:   Monitor mental status   FAMILY  - Updates: family at bedside updated 2/26 - Inter-disciplinary family meet or Palliative Care meeting due by:  3/3  Lucila Maine, DO PGY-2, [facility] 12/11/2017 7:29 AM  Attending Note:  61 year old female with PE and underwent EKOS, overnight massive nose bleeds and hemolacria who required nasal packing overnight and now on heparin alone.  O2 demand increased overnight as well.  On exam, blood nasal packing noted with clear lungs.  I reviewed CXR myself, no acute disease noted.  Patient is to go to IR today for removal of catheters.  Maintain on heparin for now.  Replace K, Mg and Phos now.  F/U labs.  Hold in  the ICU given overnight events.  If bleeding worsens will consider holding heparin for a few hours.  The patient is critically ill with multiple organ systems failure and requires high complexity decision making for assessment and support, frequent evaluation  and titration of therapies, application of advanced monitoring technologies and extensive interpretation of multiple databases.   Critical Care Time devoted to patient care services described in this note is  35  Minutes. This time reflects time of care of this signee Dr Jennet Maduro. This critical care time does not reflect procedure time, or teaching time or supervisory time of PA/NP/Med student/Med Resident etc but could involve care discussion time.  Rush Farmer, M.D. [facility] Pulmonary/Critical Care Medicine. Pager: [phone]. After hours pager: [phone].

## 2017-12-11 NOTE — Progress Notes (Signed)
Referring Physician(s): Dr Tonny Branch  Supervising Physician: Aletta Edouard  Patient Status:  [facility] - In-pt  Chief Complaint:  Pulmonary Embolus PE Lysis 2/25 in IR  Subjective:  Fibrinogen wnl Hep level .31 Hg gradual down trend; 8.3 this am Developed nose bleed approx 630 pm----subsided but redeveloped 1015 pm Afrin per CCM- Now with gauze dressing at nose--- bloody but no new blood for hrs per RN  Pt states breathing is easier Less pressure in chest  Denies pain; no N/V Rt groin site is clean and dry   Allergies: Patient has no known allergies.  Medications: Prior to Admission medications   Medication Sig Start Date End Date Taking? Authorizing Provider  atorvastatin (LIPITOR) 40 MG tablet Take 40 mg by mouth daily.   Yes [provider]  carvedilol (COREG) 25 MG tablet Take 25 mg by mouth 2 (two) times daily with a meal.   Yes [provider]  meclizine (ANTIVERT) 12.5 MG tablet Take 12.5 mg by mouth 2 (two) times daily.   Yes [provider]  Olmesartan-Amlodipine-HCTZ 40-10-25 MG TABS Take 1 tablet by mouth daily.   Yes [provider]  potassium chloride SA (K-DUR,KLOR-CON) 20 MEQ tablet Take 20 mEq by mouth daily.   Yes [provider]  traMADol (ULTRAM) 50 MG tablet Take 1 tablet (50 mg total) by mouth every 8 (eight) hours as needed for severe pain. 10/29/17  Yes Jaynee Eagles, PA-C  diclofenac sodium (VOLTAREN) 1 % GEL Apply 2 g topically 3 (three) times daily as needed. Patient not taking: Reported on 12/10/2017 11/08/17   Billie Ruddy, MD     Vital Signs: BP (!) 157/98   Pulse (!) 104   Temp 99.1 F (37.3 C) (Axillary)   Resp (!) 23   Ht 5\' 2"  (1.575 m)   Wt 201 lb 1 oz (91.2 kg)   LMP 11/17/2008 (Approximate)   SpO2 99%   BMI 36.77 kg/m   Physical Exam  Constitutional: She is oriented to person, place, and time.  HENT:  Nose bleed- controlled  Cardiovascular: Normal rate and regular rhythm.    Pulmonary/Chest: Effort normal and breath sounds normal.  Abdominal: Soft.  Musculoskeletal: Normal range of motion.  Right groin sheath intact No bleeding; NT No hematoma Soft  Rt foot 2+ pulses  Neurological: She is alert and oriented to person, place, and time.  Skin: Skin is warm and dry.  Psychiatric: She has a normal mood and affect. Her behavior is normal.  Nursing note and vitals reviewed.   Imaging: Dg Chest 2 View  Result Date: 12/09/2017 CLINICAL DATA:  Hypoxia and cough EXAM: CHEST  2 VIEW COMPARISON:  Chest radiograph 01/18/2005 FINDINGS: The heart size and mediastinal contours are within normal limits. Both lungs are clear. The visualized skeletal structures are unremarkable. IMPRESSION: No active cardiopulmonary disease. Electronically Signed   By: Ulyses Jarred M.D.   On: 12/09/2017 19:13   Ct Angio Chest Pe W And/or Wo Contrast  Addendum Date: 12/10/2017   ADDENDUM REPORT: 12/10/2017 01:11 ADDENDUM: Positive for acute PE with CT evidence of right heart strain (RV/LV Ratio = 1.74) consistent with at least submassive (intermediate risk) PE. The presence of right heart strain has been associated with an increased risk of morbidity and mortality. Please activate Code PE by paging [phone]. Electronically Signed   By: Anner Crete M.D.   On: 12/10/2017 01:11   Addendum Date: 12/10/2017   ADDENDUM REPORT: 12/10/2017 01:05 ADDENDUM: Positive for acute  PE with CTevidence of right heart strain (RV/LV Ratio = 1.74) consistent with at least submassive (intermediate risk) PE. The presence of right heart strain has been associated with an increased risk of morbidity and mortality. Electronically Signed   By: Anner Crete M.D.   On: 12/10/2017 01:05   Result Date: 12/10/2017 CLINICAL DATA:  62 year old female with shortness of breath. Generalized weakness and fatigue. EXAM: CT ANGIOGRAPHY CHEST WITH CONTRAST TECHNIQUE: Multidetector CT imaging of the chest was performed  using the standard protocol during bolus administration of intravenous contrast. Multiplanar CT image reconstructions and MIPs were obtained to evaluate the vascular anatomy. CONTRAST:  100 cc Isovue 370 COMPARISON:  Chest radiograph dated 12/09/2017 FINDINGS: Cardiovascular: There is no cardiomegaly or pericardial effusion. There is dilatation of the right ventricle with bowing of the intraventricular septum to the left. The right ventricle measures 5.4 cm in diameter and the left ventricle measures 3.1 cm in diameter. Findings consistent with right ventricular strain. There is moderate calcified and noncalcified plaque of the thoracic aorta. There is a large saddle pulmonary radiology embolus straddling the bifurcation of the main pulmonary trunk and extending into the bilateral pulmonary arteries. There is occlusion of the lobar and segmental arteries of the lower lobes. There is dilatation of the main pulmonary trunk and central pulmonary arteries. Mediastinum/Nodes: There is no hilar or mediastinal adenopathy. Esophagus and the thyroid gland are grossly unremarkable. No mediastinal fluid collection. Lungs/Pleura: There is a 3 cm subpleural bleb in the left lung base. The lungs are otherwise clear. There is no pleural effusion or pneumothorax. The central airways are patent. Upper Abdomen: There is filling defect within the visualized intrahepatic IVC (series 5, image 245). This area is only partially visualized and not well evaluated. Although this may be related to mixing artifact findings is concerning for IVC thrombus. Further evaluation with CT of the abdomen pelvis with IV contrast and delayed images in the venous phase recommended. Cholecystectomy. Musculoskeletal: Degenerative changes of the spine. No acute osseous pathology. Review of the MIP images confirms the above findings. IMPRESSION: 1. Large occlusive saddle embolus with findings of right heart straining. 2. Partially visualized filling defect in  the intrahepatic IVC concerning for thrombus. Further evaluation with CT of the abdomen and pelvis with IV contrast in venous phase recommended. These results were called by telephone at the time of interpretation on 12/10/2017 at 12:46 am to Dr. Marda Stalker , who verbally acknowledged these results. Electronically Signed: By: Anner Crete M.D. On: 12/10/2017 00:54   Ir Angiogram Pulmonary Bilateral Selective  Result Date: 12/10/2017 INDICATION: 62 year old female presents with submassive pulmonary embolism EXAM: ULTRASOUND GUIDED ACCESS RIGHT COMMON FEMORAL VEIN TIMES TO ACCESS PULMONARY ANGIOGRAM PLACEMENT OF ULTRASOUND ACCELERATED LYSIS INFUSION CATHETERS FOR PE LYSIS COMPARISON:  CT 12/10/2017 MEDICATIONS: None. ANESTHESIA/SEDATION: Versed 2.0 mg IV; Fentanyl 50 mcg IV Moderate Sedation Time:  44 minutes The patient was continuously monitored during the procedure by the interventional radiology nurse under my direct supervision. FLUOROSCOPY TIME:  Fluoroscopy Time: 9 minutes 36 seconds (73 mGy). COMPLICATIONS: None TECHNIQUE: Informed consent was obtained from the patient and the patient's family following explanation of the procedure, risks, benefits and alternatives. Specific risks include bleeding, infection, contrast reaction, kidney injury, venous injury, life-threatening hemorrhage including brain hemorrhage, gastrointestinal hemorrhage, epistaxis, need for further surgery, need for further procedure, cardiopulmonary collapse, death. The patient understands, agrees and consents for the procedure. All questions were addressed. Patient is position supine position on the fluoroscopy table. Maximal barrier sterile  technique utilized including caps, mask, sterile gowns, sterile gloves, large sterile drape, hand hygiene, and betadine prep. 1% lidocaine used for local anesthesia. Moderate sedation was provided. Ultrasound survey of the right inguinal region was performed with images stored and sent  to PACs. A single wall needle was used access the right common femoral vein under ultrasound. With venous blood flow returned, an 035 wire was passed through the needle into the IVC. A 6 French sheath was placed over the wire. The dilator was removed and the sheath was flushed. Subsequently, a single wall needle was used access the right common femoral vein adjacent to the initial puncture. With venous blood flow returned, a 6 [place] vascular sheath was placed over the wire. The dilator was removed and the sheath was flushed. Combination of a 5 French pigtail catheter, vertebral catheter and a Bentson wire was then used to select the main pulmonary artery. Bentson wire, Glidewire, and vertebral catheter was then used to navigate into the right-sided pulmonary veins, with selection of lower lobe pulmonary vein. Glidewire was used to advance into a more distal position and the vertebral catheter was placed into medial segments. Vertebral catheter was removed and a 18 cm infusion length EKOS ultrasound accelerated catheter was placed over the Glidewire. Small contrast infusion confirmed position. Catheter was flushed. Combination of a 5 French pigtail catheter, vertebral catheter and a Bentson wire was then used to select the main pulmonary artery through the second sheath. Wire was removed and a main pulmonary artery pressure transduction was recorded. Once the catheter was positioned into the lower lobar branches, a 12 cm infusion length EKOS catheter was placed over a Rosen wire. Wire was removed, small amount of contrast confirmed position and catheter was flushed. The electronic wires were then placed through both the left and right catheters. Sheaths were secured in position.  Final image was stored. The patient tolerated the procedure well and remained hemodynamically stable throughout. No complications were encountered and no significant blood loss was encountered. FINDINGS: Ultrasound survey demonstrates  patent right common femoral vein. Main pulmonary artery pressure measures 47/9 (26). Final image demonstrates right-sided catheter, 18 cm infusion length, directed into lower lobar branches and left sided catheter, 12 cm infusion length, directed into lower lobar branches. The cranial 6 French (green) sheaths transmits the right-sided catheter. The caudal 6 French (green) sheath transmits the left-sided pulmonary artery catheter. IMPRESSION: Status post placement of left and right pulmonary artery EKOS ultrasound accelerated catheters for initiation of catheter directed thrombolysis. Signed, Dulcy Fanny. Earleen Newport DO [facility] Radiology PLAN: Patient will be ICU status throughout. Bed rest. Both the right and left catheter will receive 1 mg tPA per hour for 12 hours, for a total dose of 24 mg over 12 hours. Plan for repeat trans duction of catheter pressures after treatment, reassessment, and probable removal of catheters. EVery 6 our blood draw, including CBC, heparin level, and fibrinogen. Electronically Signed   By: Corrie Mckusick D.O.   On: 12/10/2017 10:14   Ir Angiogram Selective Each Additional Vessel  Result Date: 12/10/2017 INDICATION: 62 year old female presents with submassive pulmonary embolism EXAM: ULTRASOUND GUIDED ACCESS RIGHT COMMON FEMORAL VEIN TIMES TO ACCESS PULMONARY ANGIOGRAM PLACEMENT OF ULTRASOUND ACCELERATED LYSIS INFUSION CATHETERS FOR PE LYSIS COMPARISON:  CT 12/10/2017 MEDICATIONS: None. ANESTHESIA/SEDATION: Versed 2.0 mg IV; Fentanyl 50 mcg IV Moderate Sedation Time:  44 minutes The patient was continuously monitored during the procedure by the interventional radiology nurse under my direct supervision. FLUOROSCOPY  TIME:  Fluoroscopy Time: 9 minutes 36 seconds (73 mGy). COMPLICATIONS: None TECHNIQUE: Informed consent was obtained from the patient and the patient's family following explanation of the procedure, risks, benefits and  alternatives. Specific risks include bleeding, infection, contrast reaction, kidney injury, venous injury, life-threatening hemorrhage including brain hemorrhage, gastrointestinal hemorrhage, epistaxis, need for further surgery, need for further procedure, cardiopulmonary collapse, death. The patient understands, agrees and consents for the procedure. All questions were addressed. Patient is position supine position on the fluoroscopy table. Maximal barrier sterile technique utilized including caps, mask, sterile gowns, sterile gloves, large sterile drape, hand hygiene, and betadine prep. 1% lidocaine used for local anesthesia. Moderate sedation was provided. Ultrasound survey of the right inguinal region was performed with images stored and sent to PACs. A single wall needle was used access the right common femoral vein under ultrasound. With venous blood flow returned, an 035 wire was passed through the needle into the IVC. A 6 French sheath was placed over the wire. The dilator was removed and the sheath was flushed. Subsequently, a single wall needle was used access the right common femoral vein adjacent to the initial puncture. With venous blood flow returned, a 6 [place] vascular sheath was placed over the wire. The dilator was removed and the sheath was flushed. Combination of a 5 French pigtail catheter, vertebral catheter and a Bentson wire was then used to select the main pulmonary artery. Bentson wire, Glidewire, and vertebral catheter was then used to navigate into the right-sided pulmonary veins, with selection of lower lobe pulmonary vein. Glidewire was used to advance into a more distal position and the vertebral catheter was placed into medial segments. Vertebral catheter was removed and a 18 cm infusion length EKOS ultrasound accelerated catheter was placed over the Glidewire. Small contrast infusion confirmed position. Catheter was flushed. Combination of a 5 French pigtail catheter, vertebral  catheter and a Bentson wire was then used to select the main pulmonary artery through the second sheath. Wire was removed and a main pulmonary artery pressure transduction was recorded. Once the catheter was positioned into the lower lobar branches, a 12 cm infusion length EKOS catheter was placed over a Rosen wire. Wire was removed, small amount of contrast confirmed position and catheter was flushed. The electronic wires were then placed through both the left and right catheters. Sheaths were secured in position.  Final image was stored. The patient tolerated the procedure well and remained hemodynamically stable throughout. No complications were encountered and no significant blood loss was encountered. FINDINGS: Ultrasound survey demonstrates patent right common femoral vein. Main pulmonary artery pressure measures 47/9 (26). Final image demonstrates right-sided catheter, 18 cm infusion length, directed into lower lobar branches and left sided catheter, 12 cm infusion length, directed into lower lobar branches. The cranial 6 French (green) sheaths transmits the right-sided catheter. The caudal 6 French (green) sheath transmits the left-sided pulmonary artery catheter. IMPRESSION: Status post placement of left and right pulmonary artery EKOS ultrasound accelerated catheters for initiation of catheter directed thrombolysis. Signed, Dulcy Fanny. Earleen Newport DO [facility] Radiology PLAN: Patient will be ICU status throughout. Bed rest. Both the right and left catheter will receive 1 mg tPA per hour for 12 hours, for a total dose of 24 mg over 12 hours. Plan for repeat trans duction of catheter pressures after treatment, reassessment, and probable removal of catheters. EVery 6 our blood draw, including CBC, heparin level, and fibrinogen. Electronically Signed   By: Corrie Mckusick  D.O.   On: 12/10/2017 10:14   Ir Angiogram Selective Each Additional Vessel  Result Date:  12/10/2017 INDICATION: 62 year old female presents with submassive pulmonary embolism EXAM: ULTRASOUND GUIDED ACCESS RIGHT COMMON FEMORAL VEIN TIMES TO ACCESS PULMONARY ANGIOGRAM PLACEMENT OF ULTRASOUND ACCELERATED LYSIS INFUSION CATHETERS FOR PE LYSIS COMPARISON:  CT 12/10/2017 MEDICATIONS: None. ANESTHESIA/SEDATION: Versed 2.0 mg IV; Fentanyl 50 mcg IV Moderate Sedation Time:  44 minutes The patient was continuously monitored during the procedure by the interventional radiology nurse under my direct supervision. FLUOROSCOPY TIME:  Fluoroscopy Time: 9 minutes 36 seconds (73 mGy). COMPLICATIONS: None TECHNIQUE: Informed consent was obtained from the patient and the patient's family following explanation of the procedure, risks, benefits and alternatives. Specific risks include bleeding, infection, contrast reaction, kidney injury, venous injury, life-threatening hemorrhage including brain hemorrhage, gastrointestinal hemorrhage, epistaxis, need for further surgery, need for further procedure, cardiopulmonary collapse, death. The patient understands, agrees and consents for the procedure. All questions were addressed. Patient is position supine position on the fluoroscopy table. Maximal barrier sterile technique utilized including caps, mask, sterile gowns, sterile gloves, large sterile drape, hand hygiene, and betadine prep. 1% lidocaine used for local anesthesia. Moderate sedation was provided. Ultrasound survey of the right inguinal region was performed with images stored and sent to PACs. A single wall needle was used access the right common femoral vein under ultrasound. With venous blood flow returned, an 035 wire was passed through the needle into the IVC. A 6 French sheath was placed over the wire. The dilator was removed and the sheath was flushed. Subsequently, a single wall needle was used access the right common femoral vein adjacent to the initial puncture. With venous blood flow returned, a 6 [place]  vascular sheath was placed over the wire. The dilator was removed and the sheath was flushed. Combination of a 5 French pigtail catheter, vertebral catheter and a Bentson wire was then used to select the main pulmonary artery. Bentson wire, Glidewire, and vertebral catheter was then used to navigate into the right-sided pulmonary veins, with selection of lower lobe pulmonary vein. Glidewire was used to advance into a more distal position and the vertebral catheter was placed into medial segments. Vertebral catheter was removed and a 18 cm infusion length EKOS ultrasound accelerated catheter was placed over the Glidewire. Small contrast infusion confirmed position. Catheter was flushed. Combination of a 5 French pigtail catheter, vertebral catheter and a Bentson wire was then used to select the main pulmonary artery through the second sheath. Wire was removed and a main pulmonary artery pressure transduction was recorded. Once the catheter was positioned into the lower lobar branches, a 12 cm infusion length EKOS catheter was placed over a Rosen wire. Wire was removed, small amount of contrast confirmed position and catheter was flushed. The electronic wires were then placed through both the left and right catheters. Sheaths were secured in position.  Final image was stored. The patient tolerated the procedure well and remained hemodynamically stable throughout. No complications were encountered and no significant blood loss was encountered. FINDINGS: Ultrasound survey demonstrates patent right common femoral vein. Main pulmonary artery pressure measures 47/9 (26). Final image demonstrates right-sided catheter, 18 cm infusion length, directed into lower lobar branches and left sided catheter, 12 cm infusion length, directed into lower lobar branches. The cranial 6 French (green) sheaths transmits the right-sided catheter. The caudal 6 French (green) sheath transmits the left-sided pulmonary artery catheter.  IMPRESSION: Status post placement of left and right pulmonary artery EKOS ultrasound accelerated  catheters for initiation of catheter directed thrombolysis. Signed, Dulcy Fanny. Earleen Newport DO [facility] Radiology PLAN: Patient will be ICU status throughout. Bed rest. Both the right and left catheter will receive 1 mg tPA per hour for 12 hours, for a total dose of 24 mg over 12 hours. Plan for repeat trans duction of catheter pressures after treatment, reassessment, and probable removal of catheters. EVery 6 our blood draw, including CBC, heparin level, and fibrinogen. Electronically Signed   By: Corrie Mckusick D.O.   On: 12/10/2017 10:14   Ir US Guide Vasc Access Right  Result Date: 12/10/2017 INDICATION: 62 year old female presents with submassive pulmonary embolism EXAM: ULTRASOUND GUIDED ACCESS RIGHT COMMON FEMORAL VEIN TIMES TO ACCESS PULMONARY ANGIOGRAM PLACEMENT OF ULTRASOUND ACCELERATED LYSIS INFUSION CATHETERS FOR PE LYSIS COMPARISON:  CT 12/10/2017 MEDICATIONS: None. ANESTHESIA/SEDATION: Versed 2.0 mg IV; Fentanyl 50 mcg IV Moderate Sedation Time:  44 minutes The patient was continuously monitored during the procedure by the interventional radiology nurse under my direct supervision. FLUOROSCOPY TIME:  Fluoroscopy Time: 9 minutes 36 seconds (73 mGy). COMPLICATIONS: None TECHNIQUE: Informed consent was obtained from the patient and the patient's family following explanation of the procedure, risks, benefits and alternatives. Specific risks include bleeding, infection, contrast reaction, kidney injury, venous injury, life-threatening hemorrhage including brain hemorrhage, gastrointestinal hemorrhage, epistaxis, need for further surgery, need for further procedure, cardiopulmonary collapse, death. The patient understands, agrees and consents for the procedure. All questions were addressed. Patient is position supine position on the fluoroscopy table. Maximal  barrier sterile technique utilized including caps, mask, sterile gowns, sterile gloves, large sterile drape, hand hygiene, and betadine prep. 1% lidocaine used for local anesthesia. Moderate sedation was provided. Ultrasound survey of the right inguinal region was performed with images stored and sent to PACs. A single wall needle was used access the right common femoral vein under ultrasound. With venous blood flow returned, an 035 wire was passed through the needle into the IVC. A 6 French sheath was placed over the wire. The dilator was removed and the sheath was flushed. Subsequently, a single wall needle was used access the right common femoral vein adjacent to the initial puncture. With venous blood flow returned, a 6 [place] vascular sheath was placed over the wire. The dilator was removed and the sheath was flushed. Combination of a 5 French pigtail catheter, vertebral catheter and a Bentson wire was then used to select the main pulmonary artery. Bentson wire, Glidewire, and vertebral catheter was then used to navigate into the right-sided pulmonary veins, with selection of lower lobe pulmonary vein. Glidewire was used to advance into a more distal position and the vertebral catheter was placed into medial segments. Vertebral catheter was removed and a 18 cm infusion length EKOS ultrasound accelerated catheter was placed over the Glidewire. Small contrast infusion confirmed position. Catheter was flushed. Combination of a 5 French pigtail catheter, vertebral catheter and a Bentson wire was then used to select the main pulmonary artery through the second sheath. Wire was removed and a main pulmonary artery pressure transduction was recorded. Once the catheter was positioned into the lower lobar branches, a 12 cm infusion length EKOS catheter was placed over a Rosen wire. Wire was removed, small amount of contrast confirmed position and catheter was flushed. The electronic wires were then placed through both  the left and right catheters. Sheaths were secured in position.  Final image was stored. The patient tolerated the procedure well and remained hemodynamically stable throughout. No  complications were encountered and no significant blood loss was encountered. FINDINGS: Ultrasound survey demonstrates patent right common femoral vein. Main pulmonary artery pressure measures 47/9 (26). Final image demonstrates right-sided catheter, 18 cm infusion length, directed into lower lobar branches and left sided catheter, 12 cm infusion length, directed into lower lobar branches. The cranial 6 French (green) sheaths transmits the right-sided catheter. The caudal 6 French (green) sheath transmits the left-sided pulmonary artery catheter. IMPRESSION: Status post placement of left and right pulmonary artery EKOS ultrasound accelerated catheters for initiation of catheter directed thrombolysis. Signed, Dulcy Fanny. Earleen Newport DO [facility] Radiology PLAN: Patient will be ICU status throughout. Bed rest. Both the right and left catheter will receive 1 mg tPA per hour for 12 hours, for a total dose of 24 mg over 12 hours. Plan for repeat trans duction of catheter pressures after treatment, reassessment, and probable removal of catheters. EVery 6 our blood draw, including CBC, heparin level, and fibrinogen. Electronically Signed   By: Corrie Mckusick D.O.   On: 12/10/2017 10:14   Ct Venogram Abd/pel  Result Date: 12/10/2017 CLINICAL DATA:  Submassive pulmonary embolism. 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 EXAM: CT VENOGRAM ABD-PELVIS TECHNIQUE: Multidetector CT imaging of the abdomen and pelvis was performed using the standard protocol during bolus administration of intravenous contrast. Multiplanar reconstructed images and MIPs were obtained and reviewed to evaluate the vascular anatomy. CONTRAST:  13mL ISOVUE-300 IOPAMIDOL (ISOVUE-300) INJECTION 61% COMPARISON:  01/05/2005 FINDINGS: VASCULAR Veins:  Delayed phase imaging demonstrates no evidence of DVT. Specifically there is no evidence of thrombus within the IVC. Some mixing artifact is noted. The abnormality on the accompanying chest CT represents artifact. Arterial: Portal venous phase imaging demonstrates atherosclerotic changes of the abdominal aorta. Maximal diameter of the abdominal aorta is 3.1 cm. 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 There is ectasia of the right internal iliac artery. Review of the MIP images confirms the above findings. NON-VASCULAR Lower chest: Mild dependent atelectasis. Hepatobiliary: Postcholecystectomy. Tiny cyst in the right lobe inferiorly on image 33 of series 3. Pancreas: Unremarkable Spleen: Unremarkable Adrenals/Urinary Tract: Kidneys are within normal limits. There is contrast in the collecting system from the previous scan. Tiny cysts are present in the kidneys. Adrenal glands are within normal limits. Bladder is decompressed by Foley catheter. Stomach/Bowel: Stomach and duodenum are within normal limits. There is no evidence of small-bowel obstruction. Normal appendix. No obvious mass in the colon. Lymphatic: No abnormal retroperitoneal adenopathy. Reproductive: Uterus and adnexa are within normal limits. Other: Tiny sebaceous cyst in the ischial rectal fossa on image 92 of series 3. No free-fluid. Musculoskeletal: No vertebral compression deformity. Advanced multilevel degenerative disc disease in the lumbar spine. IMPRESSION: VASCULAR There is no evidence of DVT on this study. Specifically, there is no evidence of thrombus in the IVC. The finding and noted on the accompanying chest CT represents artifact. Abdominal aortic aneurysm maximal diameter is 3.1 cm. Recommend followup by ultrasound in 3 years. This recommendation follows ACR consensus guidelines: White Paper of the ACR Incidental Findings Committee II on  Vascular Findings. 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 NON-VASCULAR Chronic changes. Electronically Signed   By: Marybelle Killings M.D.   On: 12/10/2017 06:38   Ir Infusion Thrombol Arterial Initial (ms)  Result Date: 12/10/2017 INDICATION: 62 year old female presents with submassive pulmonary embolism EXAM: ULTRASOUND GUIDED ACCESS RIGHT COMMON  FEMORAL VEIN TIMES TO ACCESS PULMONARY ANGIOGRAM PLACEMENT OF ULTRASOUND ACCELERATED LYSIS INFUSION CATHETERS FOR PE LYSIS COMPARISON:  CT 12/10/2017 MEDICATIONS: None. ANESTHESIA/SEDATION: Versed 2.0 mg IV; Fentanyl 50 mcg IV Moderate Sedation Time:  44 minutes The patient was continuously monitored during the procedure by the interventional radiology nurse under my direct supervision. FLUOROSCOPY TIME:  Fluoroscopy Time: 9 minutes 36 seconds (73 mGy). COMPLICATIONS: None TECHNIQUE: Informed consent was obtained from the patient and the patient's family following explanation of the procedure, risks, benefits and alternatives. Specific risks include bleeding, infection, contrast reaction, kidney injury, venous injury, life-threatening hemorrhage including brain hemorrhage, gastrointestinal hemorrhage, epistaxis, need for further surgery, need for further procedure, cardiopulmonary collapse, death. The patient understands, agrees and consents for the procedure. All questions were addressed. Patient is position supine position on the fluoroscopy table. Maximal barrier sterile technique utilized including caps, mask, sterile gowns, sterile gloves, large sterile drape, hand hygiene, and betadine prep. 1% lidocaine used for local anesthesia. Moderate sedation was provided. Ultrasound survey of the right inguinal region was performed with images stored and sent to PACs. A single wall needle was used access the right common femoral vein under ultrasound. With venous blood flow returned, an 035 wire was passed through the needle into  the IVC. A 6 French sheath was placed over the wire. The dilator was removed and the sheath was flushed. Subsequently, a single wall needle was used access the right common femoral vein adjacent to the initial puncture. With venous blood flow returned, a 6 [place] vascular sheath was placed over the wire. The dilator was removed and the sheath was flushed. Combination of a 5 French pigtail catheter, vertebral catheter and a Bentson wire was then used to select the main pulmonary artery. Bentson wire, Glidewire, and vertebral catheter was then used to navigate into the right-sided pulmonary veins, with selection of lower lobe pulmonary vein. Glidewire was used to advance into a more distal position and the vertebral catheter was placed into medial segments. Vertebral catheter was removed and a 18 cm infusion length EKOS ultrasound accelerated catheter was placed over the Glidewire. Small contrast infusion confirmed position. Catheter was flushed. Combination of a 5 French pigtail catheter, vertebral catheter and a Bentson wire was then used to select the main pulmonary artery through the second sheath. Wire was removed and a main pulmonary artery pressure transduction was recorded. Once the catheter was positioned into the lower lobar branches, a 12 cm infusion length EKOS catheter was placed over a Rosen wire. Wire was removed, small amount of contrast confirmed position and catheter was flushed. The electronic wires were then placed through both the left and right catheters. Sheaths were secured in position.  Final image was stored. The patient tolerated the procedure well and remained hemodynamically stable throughout. No complications were encountered and no significant blood loss was encountered. FINDINGS: Ultrasound survey demonstrates patent right common femoral vein. Main pulmonary artery pressure measures 47/9 (26). Final image demonstrates right-sided catheter, 18 cm infusion length, directed into lower  lobar branches and left sided catheter, 12 cm infusion length, directed into lower lobar branches. The cranial 6 French (green) sheaths transmits the right-sided catheter. The caudal 6 French (green) sheath transmits the left-sided pulmonary artery catheter. IMPRESSION: Status post placement of left and right pulmonary artery EKOS ultrasound accelerated catheters for initiation of catheter directed thrombolysis. Signed, Dulcy Fanny. Earleen Newport DO [facility] Radiology PLAN: Patient will be ICU status throughout. Bed rest. Both the right and left  catheter will receive 1 mg tPA per hour for 12 hours, for a total dose of 24 mg over 12 hours. Plan for repeat trans duction of catheter pressures after treatment, reassessment, and probable removal of catheters. EVery 6 our blood draw, including CBC, heparin level, and fibrinogen. Electronically Signed   By: Corrie Mckusick D.O.   On: 12/10/2017 10:14   Ir Infusion Thrombol Arterial Initial (ms)  Result Date: 12/10/2017 INDICATION: 61 year old female presents with submassive pulmonary embolism EXAM: ULTRASOUND GUIDED ACCESS RIGHT COMMON FEMORAL VEIN TIMES TO ACCESS PULMONARY ANGIOGRAM PLACEMENT OF ULTRASOUND ACCELERATED LYSIS INFUSION CATHETERS FOR PE LYSIS COMPARISON:  CT 12/10/2017 MEDICATIONS: None. ANESTHESIA/SEDATION: Versed 2.0 mg IV; Fentanyl 50 mcg IV Moderate Sedation Time:  44 minutes The patient was continuously monitored during the procedure by the interventional radiology nurse under my direct supervision. FLUOROSCOPY TIME:  Fluoroscopy Time: 9 minutes 36 seconds (73 mGy). COMPLICATIONS: None TECHNIQUE: Informed consent was obtained from the patient and the patient's family following explanation of the procedure, risks, benefits and alternatives. Specific risks include bleeding, infection, contrast reaction, kidney injury, venous injury, life-threatening hemorrhage including brain hemorrhage, gastrointestinal  hemorrhage, epistaxis, need for further surgery, need for further procedure, cardiopulmonary collapse, death. The patient understands, agrees and consents for the procedure. All questions were addressed. Patient is position supine position on the fluoroscopy table. Maximal barrier sterile technique utilized including caps, mask, sterile gowns, sterile gloves, large sterile drape, hand hygiene, and betadine prep. 1% lidocaine used for local anesthesia. Moderate sedation was provided. Ultrasound survey of the right inguinal region was performed with images stored and sent to PACs. A single wall needle was used access the right common femoral vein under ultrasound. With venous blood flow returned, an 035 wire was passed through the needle into the IVC. A 6 French sheath was placed over the wire. The dilator was removed and the sheath was flushed. Subsequently, a single wall needle was used access the right common femoral vein adjacent to the initial puncture. With venous blood flow returned, a 6 [place] vascular sheath was placed over the wire. The dilator was removed and the sheath was flushed. Combination of a 5 French pigtail catheter, vertebral catheter and a Bentson wire was then used to select the main pulmonary artery. Bentson wire, Glidewire, and vertebral catheter was then used to navigate into the right-sided pulmonary veins, with selection of lower lobe pulmonary vein. Glidewire was used to advance into a more distal position and the vertebral catheter was placed into medial segments. Vertebral catheter was removed and a 18 cm infusion length EKOS ultrasound accelerated catheter was placed over the Glidewire. Small contrast infusion confirmed position. Catheter was flushed. Combination of a 5 French pigtail catheter, vertebral catheter and a Bentson wire was then used to select the main pulmonary artery through the second sheath. Wire was removed and a main pulmonary artery pressure transduction was recorded.  Once the catheter was positioned into the lower lobar branches, a 12 cm infusion length EKOS catheter was placed over a Rosen wire. Wire was removed, small amount of contrast confirmed position and catheter was flushed. The electronic wires were then placed through both the left and right catheters. Sheaths were secured in position.  Final image was stored. The patient tolerated the procedure well and remained hemodynamically stable throughout. No complications were encountered and no significant blood loss was encountered. FINDINGS: Ultrasound survey demonstrates patent right common femoral vein. Main pulmonary artery pressure measures 47/9 (26). Final image demonstrates right-sided catheter, 18 cm  infusion length, directed into lower lobar branches and left sided catheter, 12 cm infusion length, directed into lower lobar branches. The cranial 6 French (green) sheaths transmits the right-sided catheter. The caudal 6 French (green) sheath transmits the left-sided pulmonary artery catheter. IMPRESSION: Status post placement of left and right pulmonary artery EKOS ultrasound accelerated catheters for initiation of catheter directed thrombolysis. Signed, Dulcy Fanny. Earleen Newport DO [facility] Radiology PLAN: Patient will be ICU status throughout. Bed rest. Both the right and left catheter will receive 1 mg tPA per hour for 12 hours, for a total dose of 24 mg over 12 hours. Plan for repeat trans duction of catheter pressures after treatment, reassessment, and probable removal of catheters. EVery 6 our blood draw, including CBC, heparin level, and fibrinogen. Electronically Signed   By: Corrie Mckusick D.O.   On: 12/10/2017 10:14    Labs:  CBC: Recent Labs    12/10/17 0933 12/10/17 1438 12/10/17 2131 12/11/17 0337  WBC 11.2* 10.6* 11.7* 11.6*  HGB 9.6* 9.4* 9.3* 8.3*  HCT 30.2* 29.5* 29.2* 25.9*  PLT 157 165 161 134*    COAGS: Recent Labs    12/09/17 1837  INR  1.18    BMP: Recent Labs    10/29/17 1130 12/09/17 1837 12/11/17 0423  NA 145 141 144  K 3.9 4.1 2.9*  CL 106 110 120*  CO2  --  22 19*  GLUCOSE 124* 130* 82  BUN 12 22* 9  CALCIUM  --  8.3* 6.5*  CREATININE 0.90 1.24* 0.64  GFRNONAA  --  45* >60  GFRAA  --  52* >60    LIVER FUNCTION TESTS: Recent Labs    12/09/17 1837  BILITOT 0.8  AST 15  ALT 16  ALKPHOS 71  PROT 6.4*  ALBUMIN 2.9*    Assessment and Plan:  PE Thrombolysis started in IR approx 9 am Breathing easier +nose bleed controlled Labs ok 02 sat 97% RA For follow up in IR this am  Electronically Signed: Dallyn Bergland A, PA-C 12/11/2017, 7:27 AM   I spent a total of 15 Minutes at the the patient's bedside AND on the patient's hospital floor or unit, greater than 50% of which was counseling/coordinating care for PE thrombolysis

## 2017-12-11 NOTE — Progress Notes (Signed)
eLink Physician-Brief Progress Note Patient Name: Kristi Meyers DOB: October 04, 1956 MRN: [id number]   Date of Service  12/11/2017  HPI/Events of Note  Request for BMP for AM labs.  eICU Interventions  Will order: 1. BMP, Mg++ level and PO4--- at 5 AM.     Intervention Category Minor Interventions: Clinical assessment - ordering diagnostic tests  Lysle Dingwall 12/11/2017, 4:27 AM

## 2017-12-11 NOTE — Progress Notes (Signed)
PAP pressure done at 1130  PAP pressure   38/29    Kristi Meyers

## 2017-12-11 NOTE — Progress Notes (Signed)
[place] for IV heparin Indication: pulmonary embolus  Patient Measurements: Height: 5\' 2"  (157.5 cm) Weight: 201 lb 1 oz (91.2 kg) IBW/kg (Calculated) : 50.1 Heparin Dosing Weight: 66 kg  Vital Signs: Temp: 98.6 F (37 C) (02/26 1559) Temp Source: Axillary (02/26 1559) BP: 130/86 (02/26 1700) Pulse Rate: 97 (02/26 1700)  Labs: Recent Labs    12/09/17 1837  12/10/17 1438 12/10/17 2131 12/11/17 0337 12/11/17 0423  HGB 11.0*   < > 9.4* 9.3* 8.3*  --   HCT 33.6*   < > 29.5* 29.2* 25.9*  --   PLT 199   < > 165 161 134*  --   LABPROT 14.9  --   --   --   --   --   INR 1.18  --   --   --   --   --   HEPARINUNFRC  --    < > 0.55 0.39 0.31  --   CREATININE 1.24*  --   --   --   --  0.64   < > = values in this interval not displayed.    Medical History: Past Medical History:  Diagnosis Date  . GERD (gastroesophageal reflux disease)   . High cholesterol   . Hypertension   . Vertigo     Medications:  Scheduled:  . carvedilol  6.25 mg Oral BID WC  . sodium chloride flush  3 mL Intravenous Q12H   Infusions:  . sodium chloride Stopped (12/11/17 1200)  . sodium chloride 50 mL/hr at 12/11/17 1322  . sodium chloride Stopped (12/11/17 1200)  . heparin      Assessment: 19 yoF c/o weakness, fatigue and hypotension and found to have large occlusive saddle PE with right heart strain. Pharmacy consulted for heparin dosing.  Heparin level is therapeutic this morning on low end of goal. Catheter-directed alteplase completed around 2200 yesterday. Nosebleeds have stopped with packing - but when packing was changed earlier today it was bloody, and bleeding from L eye has stopped as well.    hgb is down from admission 11 > 8.3, fibrinogen is WNL. Heparin was held earlier today with bleeding but bleeding resolved and will restart at low rate with no bolus.    Goal of Therapy:  Heparin level 0.3-0.7 units/ml Monitor platelets by anticoagulation  protocol: Yes   Plan:  - Restart Heparin at 1000 units/hr - Recheck heparin level 6hr after restart - Daily HL, CBC - Monitor for s/sx of bleeding    Bonnita Nasuti Pharm.D. CPP, BCPS Clinical Pharmacist [phone] 12/11/2017 6:13 PM

## 2017-12-11 NOTE — Progress Notes (Signed)
[place] for IV heparin Indication: pulmonary embolus  Patient Measurements: Height: 5\' 2"  (157.5 cm) Weight: 199 lb 1.2 oz (90.3 kg) IBW/kg (Calculated) : 50.1 Heparin Dosing Weight: 66 kg  Vital Signs: Temp: 99.8 F (37.7 C) (02/26 0319) Temp Source: Axillary (02/26 0319) BP: 153/89 (02/26 0400) Pulse Rate: 102 (02/26 0400)  Labs: Recent Labs    12/09/17 1837  12/10/17 1438 12/10/17 2131 12/11/17 0337  HGB 11.0*   < > 9.4* 9.3* 8.3*  HCT 33.6*   < > 29.5* 29.2* 25.9*  PLT 199   < > 165 161 134*  LABPROT 14.9  --   --   --   --   INR 1.18  --   --   --   --   HEPARINUNFRC  --    < > 0.55 0.39 0.31  CREATININE 1.24*  --   --   --   --    < > = values in this interval not displayed.    Medical History: Past Medical History:  Diagnosis Date  . GERD (gastroesophageal reflux disease)   . High cholesterol   . Hypertension   . Vertigo     Medications:  Scheduled:  . famotidine  20 mg Oral BID  . sodium chloride flush  3 mL Intravenous Q12H   Infusions:  . sodium chloride    . sodium chloride 125 mL/hr at 12/10/17 0940  . sodium chloride    . sodium chloride 35 mL/hr at 12/10/17 1018  . sodium chloride 35 mL/hr at 12/10/17 1017  . sodium chloride 15 mL/hr at 12/10/17 2300  . sodium chloride 15 mL/hr at 12/10/17 2300  . heparin 1,000 Units/hr (12/10/17 2314)    Assessment: 40 yoF c/o weakness, fatigue and hypotension and found to have large occlusive saddle PE with right heart strain. Pharmacy consulted for heparin dosing.  Heparin level is therapeutic this morning on low end of goal. Catheter-directed alteplase completed around 2200 yesterday. Nosebleeds are ongoing but they are not compromising the patient's airway.  hgb is down from admission 11 > 8.3, fibrinogen is WNL.   Goal of Therapy:  Heparin level 0.3-0.7 units/ml Monitor platelets by anticoagulation protocol: Yes   Plan:  - Continue Heparin at 1000  units/hr - Daily HL, CBC - Monitor for s/sx of bleeding     Harvel Quale 12/11/2017 4:55 AM

## 2017-12-11 NOTE — Progress Notes (Signed)
eLink Physician-Brief Progress Note Patient Name: Kristi Meyers DOB: [birth year] MRN: [id number]   Date of Service  12/11/2017  HPI/Events of Note  K+ = 2.9, PO4--- = 1.4, Mg++ = 1.4 and Creatinine = 0.64.  eICU Interventions  Will replace K+, PO4--- and Mg++.      Intervention Category Major Interventions: Electrolyte abnormality - evaluation and management  [name] 12/11/2017, 6:27 AM

## 2017-12-12 ENCOUNTER — Inpatient Hospital Stay (HOSPITAL_COMMUNITY): Payer: BC Managed Care – PPO

## 2017-12-12 DIAGNOSIS — R000 Tachycardia, unspecified: Secondary | ICD-10-CM

## 2017-12-12 DIAGNOSIS — R0902 Hypoxemia: Secondary | ICD-10-CM

## 2017-12-12 DIAGNOSIS — I10 Essential (primary) hypertension: Secondary | ICD-10-CM

## 2017-12-12 DIAGNOSIS — I5030 Unspecified diastolic (congestive) heart failure: Secondary | ICD-10-CM

## 2017-12-12 LAB — ECHOCARDIOGRAM COMPLETE
Height: 62 in
Weight: 3343.94 oz

## 2017-12-12 LAB — URINE CULTURE: Culture: >=100000 — AB

## 2017-12-12 LAB — HEPARIN LEVEL (UNFRACTIONATED)
Heparin Unfractionated: 0.28 IU/mL — ABNORMAL LOW (ref 0.30–0.70)
Heparin Unfractionated: 0.35 IU/mL (ref 0.30–0.70)

## 2017-12-12 LAB — BASIC METABOLIC PANEL
Anion gap: 12 (ref 5–15)
BUN: 6 mg/dL (ref 6–20)
CO2: 18 mmol/L — ABNORMAL LOW (ref 22–32)
Calcium: 8.5 mg/dL — ABNORMAL LOW (ref 8.9–10.3)
Chloride: 110 mmol/L (ref 101–111)
Creatinine, Ser: 0.78 mg/dL (ref 0.44–1.00)
GFR calc Af Amer: >60 mL/min (ref >60)
GFR calc non Af Amer: >60 mL/min (ref >60)
Glucose, Bld: 64 mg/dL — ABNORMAL LOW (ref 65–99)
Potassium: 4.9 mmol/L (ref 3.5–5.1)
Sodium: 140 mmol/L (ref 135–145)

## 2017-12-12 LAB — CBC
HCT: 29.5 % — ABNORMAL LOW (ref 36.0–46.0)
Hemoglobin: 9.4 g/dL — ABNORMAL LOW (ref 12.0–15.0)
MCH: 27.2 pg (ref 26.0–34.0)
MCHC: 31.9 g/dL (ref 30.0–36.0)
MCV: 85.5 fL (ref 78.0–100.0)
Platelets: 167 10*3/uL (ref 150–400)
RBC: 3.45 MIL/uL — ABNORMAL LOW (ref 3.87–5.11)
RDW: 15.4 % (ref 11.5–15.5)
WBC: 16.5 10*3/uL — ABNORMAL HIGH (ref 4.0–10.5)

## 2017-12-12 LAB — GLUCOSE, CAPILLARY
Glucose-Capillary: 145 mg/dL — ABNORMAL HIGH (ref 65–99)
Glucose-Capillary: 135 mg/dL — ABNORMAL HIGH (ref 65–99)

## 2017-12-12 LAB — MAGNESIUM: Magnesium: 2.1 mg/dL (ref 1.7–2.4)

## 2017-12-12 LAB — PHOSPHORUS: Phosphorus: 1.8 mg/dL — ABNORMAL LOW (ref 2.5–4.6)

## 2017-12-12 MED ORDER — CARVEDILOL 12.5 MG PO TABS
12.5000 mg | ORAL_TABLET | Freq: Two times a day (BID) | ORAL | Status: DC
  Administered 2017-12-12 – 2017-12-14 (×4): 12.5 mg via ORAL
  Filled 2017-12-12 (×5): qty 1

## 2017-12-12 MED ORDER — SODIUM GLYCEROPHOSPHATE 1 MMOLE/ML IV SOLN
20.0000 mmol | Freq: Once | INTRAVENOUS | Status: AC
  Administered 2017-12-12: 20 mmol via INTRAVENOUS
  Filled 2017-12-12: qty 20

## 2017-12-12 MED ORDER — FENTANYL CITRATE (PF) 100 MCG/2ML IJ SOLN
25.0000 ug | Freq: Once | INTRAMUSCULAR | Status: AC
  Administered 2017-12-12: 25 ug via INTRAVENOUS
  Filled 2017-12-12: qty 2

## 2017-12-12 NOTE — Progress Notes (Addendum)
PULMONARY / CRITICAL CARE MEDICINE   Name: Kristi Meyers MRN: [id number] DOB: [birth year]    ADMISSION DATE:  12/09/2017 CONSULTATION DATE:  12/09/17  REFERRING MD:  Tegeler  CHIEF COMPLAINT:  SOB, Shock  HISTORY OF PRESENT ILLNESS:    62yoF with history of HTN, HLD, and GERD, presents to the ER tonight c/o SOB, Dizzyness, and Generalized weakness x 2 days. She also c/o constipation. She denies Cough, CP, Fever, or LE edema. In the ER she was found to be in shock with BP 70/42, which has since improved to 104/77 after 2L IVF. She reports her SBP as an outpatient is typically in 170's. CTA revealed bilateral PE's in right and left main PA's as well as saddle embolus and DVT within the IVF at the hepatic level. She was started on a Heparin gtt.   She has not had any recent surgeries (only surgery ever was cholecystectomy 9 years ago). Denies history of CVA or GIB or head trauma. Denies any recent travel (car or plane) or being bed bound. Denies any other family members having had DVT's or PE's. Denies any history of malignancy.   PAST MEDICAL HISTORY :  She  has a past medical history of GERD (gastroesophageal reflux disease), High cholesterol, Hypertension, and Vertigo.  REVIEW OF SYSTEMS:   No chest pain, SOB, fevers, chills. Endorses leg pain.  SUBJECTIVE:  Overnight had pain and SOB. This morning c/o pain in her legs. On room air. Denies SOB.   VITAL SIGNS: BP (!) 125/91   Pulse (!) 108   Temp 98.1 F (36.7 C) (Oral)   Resp (!) 28   Ht 5\' 2"  (1.575 m)   Wt 208 lb 15.9 oz (94.8 kg)   LMP 11/17/2008 (Approximate)   SpO2 96%   BMI 38.23 kg/m   HEMODYNAMICS:    VENTILATOR SETTINGS: FiO2 (%):  [31 %] 31 %  INTAKE / OUTPUT: I/O last 3 completed shifts: In: 6438.9 [I.V.:5608.9; IV Piggyback:830] Out: 6710 [Urine:6710]  PHYSICAL EXAMINATION: General: elderly AA female laying in bed in NAD, venturi mask in place Neuro: AAOx3, moving all extremities equally, no focal  deficits HEENT: MMM. No epistaxis or hemolacria Cardiovascular: RRR no m/r/g Lungs: CTA b/l Abdomen: Soft NTND Musculoskeletal: Trace BLE edema; both legs appear roughly equal in size. Skin: no rashes, skin in tact  LABS:  BMET Recent Labs  Lab 12/09/17 1837 12/11/17 0423 12/12/17 0313  NA 141 144 140  K 4.1 2.9* 4.9  CL 110 120* 110  CO2 22 19* 18*  BUN 22* 9 6  CREATININE 1.24* 0.64 0.78  GLUCOSE 130* 82 64*    Electrolytes Recent Labs  Lab 12/09/17 1837 12/11/17 0423 12/12/17 0313  CALCIUM 8.3* 6.5* 8.5*  MG 2.0 1.4* 2.1  PHOS  --  1.4* 1.8*    CBC Recent Labs  Lab 12/10/17 2131 12/11/17 0337 12/12/17 0313  WBC 11.7* 11.6* 16.5*  HGB 9.3* 8.3* 9.4*  HCT 29.2* 25.9* 29.5*  PLT 161 134* 167   Coag's Recent Labs  Lab 12/09/17 1837  INR 1.18   Sepsis Markers Recent Labs  Lab 12/09/17 1845 12/09/17 2106  LATICACIDVEN 1.43 1.25   ABG No results for input(s): PHART, PCO2ART, PO2ART in the last 168 hours.  Liver Enzymes Recent Labs  Lab 12/09/17 1837  AST 15  ALT 16  ALKPHOS 71  BILITOT 0.8  ALBUMIN 2.9*   Cardiac Enzymes No results for input(s): TROPONINI, PROBNP in the last 168 hours.  Glucose Recent Labs  Lab 12/10/17 0422 12/10/17 0717 12/11/17 1235  GLUCAP 85 73 109*    Imaging Ir Jacolyn Reedy F/u Eval Art/ven Final Day (ms)  Result Date: 12/11/2017 INDICATION: Status post completion of 12 hours of bilateral catheter directed ultrasound assisted thrombolytic therapy to treat bilateral submassive pulmonary embolism. Thrombolytic therapy was complicated by a significant nosebleed and some associated drop in hemoglobin. Epistaxis has ceased after completion of thrombolytic therapy. EXAM: FOLLOW-UP OF PULMONARY ARTERIAL THROMBOLYTIC THERAPY, FINAL DAY MEDICATIONS: None ANESTHESIA/SEDATION: None CONTRAST:  None FLUOROSCOPY TIME:  None COMPLICATIONS: None immediate. PROCEDURE: Catheters were retracted into the main pulmonary artery. Repeat  pulmonary artery pressures were obtained. FINDINGS: Repeat pulmonary artery pressure is 38/29 (32) mm Hg. Prior to infusion, documented pulmonary artery pressure was 47/9 (26) mm Hg. IMPRESSION: There is some decrease in systolic pulmonary arterial pressure after bilateral ultrasound assisted thrombolytic therapy. Mean pressure did not decrease due to higher measured diastolic pressure. Due to significant epistaxis overnight, further thrombolytic therapy will not be performed. Electronically Signed   By: Aletta Edouard M.D.   On: 12/11/2017 15:46   STUDIES:  CTA 2/25 > Positive for acute PE with CT evidence of right heart strain (RV/LV Ratio = 1.74) consistent with at least submassive (intermediate risk) PE.  CT venogram 2/25 > no thrombus in IVC LE Dopplers 2/25 > LLE DVT, no RLE clot seen Echo 2/25 > could not obtain  CULTURES: none  ANTIBIOTICS: none  SIGNIFICANT EVENTS: 2/23 PM: presented to [facility] ER with shock, sob, and weakness, found to have massive PE with saddle embolus. BP improved with IVF's but remained soft.  2/25 tPA running per IR 2/25 evening- nosebleed  LINES/TUBES: PIV Foley 2/25  DISCUSSION: 62yoF with history of HTN, HLD, and GERD, presents to the ER tonight c/o SOB, Dizzyness, and Generalized weakness x 2 days, found to have massive PE with large bilateral clot burden and saddle embolus.  ASSESSMENT / PLAN:  PULMONARY A: Massive PE Wheezing overnight P:   Continue heparin drip Reorder TTE- could not be done while on EKOS  CARDIOVASCULAR A:  Shock- resolved w/ fluid resuscitation Elevated troponin- likely 2/2 demand P:  Monitor on tele Restarted home coreg, hold other home BP meds Echo as above  RENAL A:   AKI - resolved P:   Follow BMP  GASTROINTESTINAL A:   Hx gerd P:   HH diet  HEMATOLOGIC A:   Massive PE Nosebleed- resolved Hemolacria - resolved P:  Continue Heparin drip, monitor for bleeding  INFECTIOUS A:   leukocytosis P:    Monitor for fevers, leukocytosis  ENDOCRINE A:   No issues P:   Monitor CBGs  NEUROLOGIC A:   No issues P:   Monitor mental status  DISPO: to stepdown today  FAMILY  - Updates: family at bedside updated 2/27 - Inter-disciplinary family meet or Palliative Care meeting due by:  3/3  Lucila Maine, DO PGY-2, [facility] 12/12/2017 7:46 AM  Attending Note:  62 year old female with massive PE, obstructive shock and respiratory failure that underwent EKOS and is now on RA and hypertensive with tachycardia.  On exam, lungs are clear and left leg is edematous.  I reviewed CT myself, PE noted.  Discussed wth TRH-MD.  PE:  - Heparin  - Will need to determine warfrin vs NOAC  Hypoxemia:  - Titrate O2 for sat of 88-92%.  Tachycardia:  - Increase coreg to 12.5 BID  - Tele monitoring  Essential hypertension:  - Increase coreg as  above  - Pending BP and HR will need to determine if adding back norvasc is appropriate  ADL:  - OOB  - PT/OT  Transfer to tele and to [facility] service with PCCM off 2/28.  Patient seen and examined, agree with above note.  I dictated the care and orders written for this patient under my direction.  Rush Farmer, [place]

## 2017-12-12 NOTE — Evaluation (Signed)
Physical Therapy Evaluation Patient Details Name: Kristi Meyers MRN: [id number] DOB: 03-06-1956 Today's Date: 12/12/2017   History of Present Illness  66yoF with history of HTN, HLD, and GERD, presents to the ER tonight c/o SOB, Dizzyness, and Generalized weakness x 2 days. She also c/o constipation. She denies Cough, CP, Fever, or LE edema. In the ER she was found to be in shock with BP 70/42, which has since improved to 104/77 after 2L IVF. She reports her SBP as an outpatient is typically in 170's. CTA revealed bilateral PE's in right and left main PA's as well as saddle embolus and DVT within the IVF at the hepatic level. She was started on a Heparin.  PMH: GERD, HTN and vertigo.  Clinical Impression  Pt admitted with above diagnosis. Pt currently with functional limitations due to the deficits listed below (see PT Problem List). Pt was able to pivot to chair with mod assist and cues.  Weakness noted.  Will probably need SNF prior to d/c home.  Will follow acutely. Pt will benefit from skilled PT to increase their independence and safety with mobility to allow discharge to the venue listed below.      Follow Up Recommendations SNF;Supervision/Assistance - 24 hour    Equipment Recommendations  None recommended by PT    Recommendations for Other Services       Precautions / Restrictions Precautions Precautions: Fall Restrictions Weight Bearing Restrictions: No      Mobility  Bed Mobility Overal bed mobility: Needs Assistance Bed Mobility: Supine to Sit     Supine to sit: Mod assist     General bed mobility comments: needed assist for moving LEs to EOB and for elevation of trunk.   Transfers Overall transfer level: Needs assistance Equipment used: 2 person hand held assist Transfers: Sit to/from [facility] Sit to Stand: Mod assist Stand pivot transfers: Mod assist       General transfer comment: Needed mod assist for support to power up and then  support to pivot steps to chair with assist to weight shift.   Ambulation/Gait             General Gait Details: unable  Stairs            Wheelchair Mobility    Modified Rankin (Stroke Patients Only)       Balance Overall balance assessment: Needs assistance Sitting-balance support: No upper extremity supported;Feet supported Sitting balance-Leahy Scale: Fair     Standing balance support: Bilateral upper extremity supported;During functional activity Standing balance-Leahy Scale: Poor Standing balance comment: relies on UE support for balance                             Pertinent Vitals/Pain Pain Assessment: Faces Faces Pain Scale: Hurts even more Pain Location: generalized Pain Descriptors / Indicators: Aching;Grimacing;Guarding Pain Intervention(s): Limited activity within patient's tolerance;Monitored during session;Repositioned  VSS   Home Living Family/patient expects to be discharged to:: Private residence Living Arrangements: Other relatives Available Help at Discharge: Family;Available PRN/intermittently Type of Home: Apartment Home Access: Stairs to enter Entrance Stairs-Rails: Right Entrance Stairs-Number of Steps: flight Home Layout: One level Home Equipment: Walker - 2 wheels;Bedside commode;Cane - single point      Prior Function Level of Independence: Independent with assistive device(s)         Comments: was using cane for last 4 weeks due to weakness per pt, could B/D herself.  Hand Dominance        Extremity/Trunk Assessment   Upper Extremity Assessment Upper Extremity Assessment: Defer to OT evaluation    Lower Extremity Assessment Lower Extremity Assessment: RLE deficits/detail;LLE deficits/detail RLE Deficits / Details: grossly 3-/5 LLE Deficits / Details: grossly 3-/5    Cervical / Trunk Assessment Cervical / Trunk Assessment: Kyphotic  Communication   Communication: No difficulties  Cognition  Arousal/Alertness: Awake/alert Behavior During Therapy: WFL for tasks assessed/performed Overall Cognitive Status: Within Functional Limits for tasks assessed                                        General Comments      Exercises     Assessment/Plan    PT Assessment Patient needs continued PT services  PT Problem List Decreased activity tolerance;Decreased balance;Decreased mobility;Decreased knowledge of use of DME;Decreased safety awareness;Decreased knowledge of precautions       PT Treatment Interventions DME instruction;Gait training;Functional mobility training;Therapeutic activities;Therapeutic exercise;Balance training;Stair training;Patient/family education    PT Goals (Current goals can be found in the Care Plan section)  Acute Rehab PT Goals Patient Stated Goal: to get stronger PT Goal Formulation: With patient Time For Goal Achievement: 12/26/17 Potential to Achieve Goals: Good    Frequency Min 3X/week   Barriers to discharge Decreased caregiver support      Co-evaluation               AM-PAC PT "6 Clicks" Daily Activity  Outcome Measure Difficulty turning over in bed (including adjusting bedclothes, sheets and blankets)?: Unable Difficulty moving from lying on back to sitting on the side of the bed? : Unable Difficulty sitting down on and standing up from a chair with arms (e.g., wheelchair, bedside commode, etc,.)?: A Lot Help needed moving to and from a bed to chair (including a wheelchair)?: A Lot Help needed walking in hospital room?: Total Help needed climbing 3-5 steps with a railing? : Total 6 Click Score: 8    End of Session Equipment Utilized During Treatment: Gait belt Activity Tolerance: Patient limited by fatigue Patient left: in chair;with call bell/phone within reach;with chair alarm set;with family/visitor present Nurse Communication: Mobility status PT Visit Diagnosis: Unsteadiness on feet (R26.81);Muscle weakness  (generalized) (M62.81)    Time: 1194-1740 PT Time Calculation (min) (ACUTE ONLY): 20 min   Charges:   PT Evaluation $PT Eval Moderate Complexity: 1 Mod     PT G Codes:        Anderson Moira Umholtz,PT Acute Rehabilitation [phone] (pager)   Denice Paradise 12/12/2017, 1:50 PM

## 2017-12-12 NOTE — Progress Notes (Signed)
  Echocardiogram 2D Echocardiogram has been performed.  Kristi Meyers 12/12/2017, 9:17 AM

## 2017-12-12 NOTE — Progress Notes (Signed)
ANTICOAGULATION CONSULT NOTE  Pharmacy Consult for IV heparin Indication: pulmonary embolus  Patient Measurements: Height: 5\' 2"  (157.5 cm) Weight: 201 lb 1 oz (91.2 kg) IBW/kg (Calculated) : 50.1 Heparin Dosing Weight: 66 kg  Vital Signs: Temp: 98.7 F (37.1 C) (02/26 1929) Temp Source: Oral (02/26 1929) BP: 162/93 (02/27 0000) Pulse Rate: 114 (02/27 0000)  Labs: Recent Labs    12/09/17 1837  12/10/17 1438 12/10/17 2131 12/11/17 0337 12/11/17 0423 12/12/17 0023  HGB 11.0*   < > 9.4* 9.3* 8.3*  --   --   HCT 33.6*   < > 29.5* 29.2* 25.9*  --   --   PLT 199   < > 165 161 134*  --   --   LABPROT 14.9  --   --   --   --   --   --   INR 1.18  --   --   --   --   --   --   HEPARINUNFRC  --    < > 0.55 0.39 0.31  --  0.28*  CREATININE 1.24*  --   --   --   --  0.64  --    < > = values in this interval not displayed.    Medical History: Past Medical History:  Diagnosis Date  . GERD (gastroesophageal reflux disease)   . High cholesterol   . Hypertension   . Vertigo     Medications:  Scheduled:  . carvedilol  6.25 mg Oral BID WC  . sodium chloride flush  3 mL Intravenous Q12H   Infusions:  . sodium chloride Stopped (12/11/17 1200)  . sodium chloride 50 mL/hr at 12/11/17 1322  . sodium chloride Stopped (12/11/17 1200)  . heparin 1,000 Units/hr (12/11/17 1822)    Assessment: 82 yoF c/o weakness, fatigue and hypotension and found to have large occlusive saddle PE with right heart strain. Pharmacy consulted for heparin dosing.  Heparin level is therapeutic this morning on low end of goal. Catheter-directed alteplase completed around 2200 yesterday. Nosebleeds have stopped with packing - but when packing was changed earlier today it was bloody, and bleeding from L eye has stopped as well.  Hgb is down from admission 11 > 8.3, fibrinogen is WNL. Heparin was held earlier today with bleeding but bleeding resolved and will restart at low rate with no bolus.    Update  2/27 AM: heparin level low at 0.28, stable per RN  Goal of Therapy:  Heparin level 0.3-0.7 units/ml Monitor platelets by anticoagulation protocol: Yes   Plan:  Inc heparin to 1100 units/hr 0900 HL  Narda Bonds, PharmD, BCPS Clinical Pharmacist Phone: [phone]

## 2017-12-12 NOTE — Progress Notes (Signed)
ANTICOAGULATION CONSULT NOTE  Pharmacy Consult for IV heparin Indication: pulmonary embolus  Patient Measurements: Height: 5\' 2"  (157.5 cm) Weight: 208 lb 15.9 oz (94.8 kg) IBW/kg (Calculated) : 50.1 Heparin Dosing Weight: 66 kg  Vital Signs: Temp: 98.8 F (37.1 C) (02/27 1156) Temp Source: Oral (02/27 1156) BP: 137/82 (02/27 1200) Pulse Rate: 105 (02/27 1200)  Labs: Recent Labs    12/09/17 1837  12/10/17 2131 12/11/17 0337 12/11/17 0423 12/12/17 0023 12/12/17 0313 12/12/17 1053  HGB 11.0*   < > 9.3* 8.3*  --   --  9.4*  --   HCT 33.6*   < > 29.2* 25.9*  --   --  29.5*  --   PLT 199   < > 161 134*  --   --  167  --   LABPROT 14.9  --   --   --   --   --   --   --   INR 1.18  --   --   --   --   --   --   --   HEPARINUNFRC  --    < > 0.39 0.31  --  0.28*  --  0.35  CREATININE 1.24*  --   --   --  0.64  --  0.78  --    < > = values in this interval not displayed.    Assessment: 52 yoF c/o weakness, fatigue and hypotension and found to have large occlusive saddle PE with right heart strain. Pharmacy consulted for heparin dosing.  Heparin level is therapeutic this morning on low end of goal. Catheter-directed alteplase completed around 2200 yesterday. Nosebleeds have stopped with packing - but when packing was changed earlier today it was bloody, and bleeding from L eye has stopped as well.  Hgb is down from admission 11 > 8.3, fibrinogen is WNL. Heparin was held earlier today with bleeding but bleeding resolved and will restart at low rate with no bolus.    Heparin level now therapeutic  Goal of Therapy:  Heparin level 0.3-0.7 units/ml Monitor platelets by anticoagulation protocol: Yes   Plan:  Continue heparin at 1100 units / hr Follow up AM labs  Thank you Anette Guarneri, PharmD [phone]

## 2017-12-12 NOTE — Progress Notes (Signed)
eLink Physician-Brief Progress Note Patient Name: Kristi Meyers DOB: March 23, 1956 MRN: [id number]   Date of Service  12/12/2017  HPI/Events of Note  Multiple issues: 1. Wheezing - getting a neb Rx/ Sat = 96% and RR = 25,  2. PO4--- = 1.8 and 3. Patient c/o pain.   eICU Interventions  Will order: 1. Portable CXR now. 2. Replace PO4---. 3. Fentanyl 25 mcg IV X 1.      Intervention Category Major Interventions: Electrolyte abnormality - evaluation and management Intermediate Interventions: Respiratory distress - evaluation and management  Briget Shaheed Eugene 12/12/2017, 5:55 AM

## 2017-12-13 DIAGNOSIS — R0603 Acute respiratory distress: Secondary | ICD-10-CM

## 2017-12-13 LAB — CBC
HCT: 25.7 % — ABNORMAL LOW (ref 36.0–46.0)
Hemoglobin: 8.5 g/dL — ABNORMAL LOW (ref 12.0–15.0)
MCH: 27.6 pg (ref 26.0–34.0)
MCHC: 33.1 g/dL (ref 30.0–36.0)
MCV: 83.4 fL (ref 78.0–100.0)
Platelets: 203 10*3/uL (ref 150–400)
RBC: 3.08 MIL/uL — ABNORMAL LOW (ref 3.87–5.11)
RDW: 15.5 % (ref 11.5–15.5)
WBC: 17.2 10*3/uL — ABNORMAL HIGH (ref 4.0–10.5)

## 2017-12-13 LAB — GLUCOSE, CAPILLARY: Glucose-Capillary: 90 mg/dL (ref 65–99)

## 2017-12-13 LAB — HEPARIN LEVEL (UNFRACTIONATED): Heparin Unfractionated: 0.3 IU/mL (ref 0.30–0.70)

## 2017-12-13 MED ORDER — FUROSEMIDE 10 MG/ML IJ SOLN
40.0000 mg | Freq: Once | INTRAMUSCULAR | Status: AC
  Administered 2017-12-13: 40 mg via INTRAVENOUS
  Filled 2017-12-13: qty 4

## 2017-12-13 NOTE — Progress Notes (Signed)
[facility] PROGRESS NOTE  Kristi Meyers OVZ:[id number] DOB: 1956-05-24 DOA: 12/09/2017   PCP: Billie Ruddy, MD     Assessment/Plan: Active Problems:   Acute saddle pulmonary embolus ([place])  62yoF with history of HTN, HLD, and GERD, presents to the ER tonight c/o SOB, Dizziness , and Generalized weakness x 2 days.   In the ER she was found to be in shock with BP 70/42, which has since improved to 104/77 after 2L IVF. She reports her SBP as an outpatient is typically in 170's. CTA revealed bilateral PE's in right and left main PA's as well as saddle embolus and DVT . She was started on a Heparin gtt. noted to have nosebleed on 2/25.  Status post placement of left and right pulmonary artery EKOS ultrasound accelerated catheters for initiation of catheter directed thrombolysis 2/25. Patient received tPA, still SOB, has had intermittent epistaxis. TRH picked up 12/13/17     Assessment and plan    Massive PE, with obstructive shock and respiratory failure, status post EKOS  Now on room air  Continue heparin drip for another 24 hours and then transition to eliquis if no further epistaxis on heparin gtt  Repeat TEE shows EF of 60-65% with grade 1 diastolic dysfunction, Right ventricle . Systolic function was normal. Patient had epistaxis on 2/27 therefore we'll continue heparin today and monitor  prior to starting oral anticoagulation Monitor  for bleeding while the patient is on a heparin drip symptomatically still sob, will given lasix once and see if her subjective dyspnea improves   Obstructive shock, resolved  Elevated troponin- likely 2/2 demand Monitor on tele Restarted home coreg for tachycardia, hold other home BP meds     AKI - resolved.1.24>0.78    GERD -stable     leukocytosis, white blood cell count is 16.5 on 2/27 will repeat CBC in a.m. UA on admission was negative . Chest x-ray shows hypoinflation with pulmonary vascular congestion on 2/27   DVT  prophylaxsis heparin gtt  Code Status:  Full code    Family Communication: Discussed in detail with the patient, all imaging results, lab results explained to the patient   Disposition Plan:   2-3 days    Consultants:   PCCM  Procedures:   none   Antibiotics: Anti-infectives (From admission, onward)   None         HPI/Subjective: Still sob at rest, no epistaxis but smears of blood on tissue   Objective: Vitals:   12/12/17 2015 12/12/17 2219 12/13/17 0547 12/13/17 0737  BP: 133/78 112/72 (!) 146/93 (!) 147/88  Pulse:  99  93  Resp:      Temp: 98.6 F (37 C) 99.1 F (37.3 C) 97.7 F (36.5 C)   TempSrc: Oral Axillary Oral   SpO2: 99% 93% 99%   Weight:      Height:        Intake/Output Summary (Last 24 hours) at 12/13/2017 0829 Last data filed at 12/13/2017 0540 Gross per 24 hour  Intake 281 ml  Output 890 ml  Net -609 ml    Exam:  Examination:  General exam: Appears calm and comfortable  Respiratory system: Clear to auscultation. Respiratory effort increased Cardiovascular system: S1 & S2 heard, RRR. No JVD, murmurs, rubs, gallops or clicks. No pedal edema. Gastrointestinal system: Abdomen is nondistended, soft and nontender. No organomegaly or masses felt. Normal bowel sounds heard. Central nervous system: Alert and oriented. No focal neurological deficits. Extremities: Symmetric  5 x 5 power. Skin: No rashes, lesions or ulcers Psychiatry: Judgement and insight appear normal. Mood & affect appropriate.     Data Reviewed: I have personally reviewed following labs and imaging studies  Micro Results Recent Results (from the past 240 hour(s))  Urine culture     Status: Abnormal   Collection Time: 12/09/17  6:09 PM  Result Value Ref Range Status   Specimen Description   Final    URINE, CLEAN CATCH Performed at [facility] [address] [place], [place] [address]    Special Requests   Final    NONE Performed at [facility], [place] [address], [place], [place] [address]    Culture >=100,000 COLONIES/mL ESCHERICHIA COLI (A)  Final   Report Status 12/12/2017 FINAL  Final   Organism ID, Bacteria ESCHERICHIA COLI (A)  Final      Susceptibility   Escherichia coli - MIC*    AMPICILLIN 16 INTERMEDIATE Intermediate     CEFAZOLIN <=4 SENSITIVE Sensitive     CEFTRIAXONE <=1 SENSITIVE Sensitive     CIPROFLOXACIN <=0.25 SENSITIVE Sensitive     GENTAMICIN <=1 SENSITIVE Sensitive     IMIPENEM <=0.25 SENSITIVE Sensitive     NITROFURANTOIN <=16 SENSITIVE Sensitive     TRIMETH/SULFA <=20 SENSITIVE Sensitive     AMPICILLIN/SULBACTAM 4 SENSITIVE Sensitive     PIP/TAZO <=4 SENSITIVE Sensitive     Extended ESBL NEGATIVE Sensitive     * >=100,000 COLONIES/mL ESCHERICHIA COLI  Blood culture (routine x 2)     Status: None (Preliminary result)   Collection Time: 12/09/17  6:14 PM  Result Value Ref Range Status   Specimen Description   Final    RIGHT ANTECUBITAL Performed at [facility] [address] [place], [place] [address]    Special Requests   Final    BOTTLES DRAWN AEROBIC AND ANAEROBIC Blood Culture adequate volume Performed at [facility] [address], [place], [place] [address]    Culture   Final    NO GROWTH 2 DAYS Performed at [facility] [address] [place], [place] [address]    Report Status PENDING  Incomplete  MRSA PCR Screening     Status: None   Collection Time: 12/10/17  7:55 AM  Result Value Ref Range Status   MRSA by PCR NEGATIVE NEGATIVE Final    Comment:        The GeneXpert MRSA Assay (FDA approved for NASAL specimens only), is one component of a comprehensive MRSA colonization surveillance program. It is not intended to diagnose MRSA infection nor to guide or monitor treatment for MRSA infections. Performed at [facility], [place] [address] [place],  [address]     Radiology Reports Dg Chest 2  View  Result Date: 12/09/2017 CLINICAL DATA:  Hypoxia and cough EXAM: CHEST  2 VIEW COMPARISON:  Chest radiograph 01/18/2005 FINDINGS: The heart size and mediastinal contours are within normal limits. Both lungs are clear. The visualized skeletal structures are unremarkable. IMPRESSION: No active cardiopulmonary disease. Electronically Signed   By: Ulyses Jarred M.D.   On: 12/09/2017 19:13   Ct Angio Chest Pe W And/or Wo Contrast  Addendum Date: 12/10/2017   ADDENDUM REPORT: 12/10/2017 01:11 ADDENDUM: Positive for acute PE with CT evidence of right heart strain (RV/LV Ratio = 1.74) consistent with at least submassive (intermediate risk) PE. The presence of right heart strain has been associated with an increased risk of  morbidity and mortality. Please activate Code PE by paging [phone]. Electronically Signed   By: Anner Crete M.D.   On: 12/10/2017 01:11   Addendum Date: 12/10/2017   ADDENDUM REPORT: 12/10/2017 01:05 ADDENDUM: Positive for acute PE with CTevidence of right heart strain (RV/LV Ratio = 1.74) consistent with at least submassive (intermediate risk) PE. The presence of right heart strain has been associated with an increased risk of morbidity and mortality. Electronically Signed   By: Anner Crete M.D.   On: 12/10/2017 01:05   Result Date: 12/10/2017 CLINICAL DATA:  62 year old female with shortness of breath. Generalized weakness and fatigue. EXAM: CT ANGIOGRAPHY CHEST WITH CONTRAST TECHNIQUE: Multidetector CT imaging of the chest was performed using the standard protocol during bolus administration of intravenous contrast. Multiplanar CT image reconstructions and MIPs were obtained to evaluate the vascular anatomy. CONTRAST:  100 cc Isovue 370 COMPARISON:  Chest radiograph dated 12/09/2017 FINDINGS: Cardiovascular: There is no cardiomegaly or pericardial effusion. There is dilatation of the right ventricle with bowing of the intraventricular septum to the left. The right  ventricle measures 5.4 cm in diameter and the left ventricle measures 3.1 cm in diameter. Findings consistent with right ventricular strain. There is moderate calcified and noncalcified plaque of the thoracic aorta. There is a large saddle pulmonary radiology embolus straddling the bifurcation of the main pulmonary trunk and extending into the bilateral pulmonary arteries. There is occlusion of the lobar and segmental arteries of the lower lobes. There is dilatation of the main pulmonary trunk and central pulmonary arteries. Mediastinum/Nodes: There is no hilar or mediastinal adenopathy. Esophagus and the thyroid gland are grossly unremarkable. No mediastinal fluid collection. Lungs/Pleura: There is a 3 cm subpleural bleb in the left lung base. The lungs are otherwise clear. There is no pleural effusion or pneumothorax. The central airways are patent. Upper Abdomen: There is filling defect within the visualized intrahepatic IVC (series 5, image 245). This area is only partially visualized and not well evaluated. Although this may be related to mixing artifact findings is concerning for IVC thrombus. Further evaluation with CT of the abdomen pelvis with IV contrast and delayed images in the venous phase recommended. Cholecystectomy. Musculoskeletal: Degenerative changes of the spine. No acute osseous pathology. Review of the MIP images confirms the above findings. IMPRESSION: 1. Large occlusive saddle embolus with findings of right heart straining. 2. Partially visualized filling defect in the intrahepatic IVC concerning for thrombus. Further evaluation with CT of the abdomen and pelvis with IV contrast in venous phase recommended. These results were called by telephone at the time of interpretation on 12/10/2017 at 12:46 am to Dr. Marda Stalker , who verbally acknowledged these results. Electronically Signed: By: Anner Crete M.D. On: 12/10/2017 00:54   Ir Angiogram Pulmonary Bilateral Selective  Result  Date: 12/10/2017 INDICATION: 62 year old female presents with submassive pulmonary embolism EXAM: ULTRASOUND GUIDED ACCESS RIGHT COMMON FEMORAL VEIN TIMES TO ACCESS PULMONARY ANGIOGRAM PLACEMENT OF ULTRASOUND ACCELERATED LYSIS INFUSION CATHETERS FOR PE LYSIS COMPARISON:  CT 12/10/2017 MEDICATIONS: None. ANESTHESIA/SEDATION: Versed 2.0 mg IV; Fentanyl 50 mcg IV Moderate Sedation Time:  44 minutes The patient was continuously monitored during the procedure by the interventional radiology nurse under my direct supervision. FLUOROSCOPY TIME:  Fluoroscopy Time: 9 minutes 36 seconds (73 mGy). COMPLICATIONS: None TECHNIQUE: Informed consent was obtained from the patient and the patient's family following explanation of the procedure, risks, benefits and alternatives. Specific risks include bleeding, infection, contrast reaction, kidney injury, venous injury, life-threatening hemorrhage including brain hemorrhage,  gastrointestinal hemorrhage, epistaxis, need for further surgery, need for further procedure, cardiopulmonary collapse, death. The patient understands, agrees and consents for the procedure. All questions were addressed. Patient is position supine position on the fluoroscopy table. Maximal barrier sterile technique utilized including caps, mask, sterile gowns, sterile gloves, large sterile drape, hand hygiene, and betadine prep. 1% lidocaine used for local anesthesia. Moderate sedation was provided. Ultrasound survey of the right inguinal region was performed with images stored and sent to PACs. A single wall needle was used access the right common femoral vein under ultrasound. With venous blood flow returned, an 035 wire was passed through the needle into the IVC. A 6 French sheath was placed over the wire. The dilator was removed and the sheath was flushed. Subsequently, a single wall needle was used access the right common femoral vein adjacent to the initial puncture. With venous blood flow returned, a 6  [place] vascular sheath was placed over the wire. The dilator was removed and the sheath was flushed. Combination of a 5 French pigtail catheter, vertebral catheter and a Bentson wire was then used to select the main pulmonary artery. Bentson wire, Glidewire, and vertebral catheter was then used to navigate into the right-sided pulmonary veins, with selection of lower lobe pulmonary vein. Glidewire was used to advance into a more distal position and the vertebral catheter was placed into medial segments. Vertebral catheter was removed and a 18 cm infusion length EKOS ultrasound accelerated catheter was placed over the Glidewire. Small contrast infusion confirmed position. Catheter was flushed. Combination of a 5 French pigtail catheter, vertebral catheter and a Bentson wire was then used to select the main pulmonary artery through the second sheath. Wire was removed and a main pulmonary artery pressure transduction was recorded. Once the catheter was positioned into the lower lobar branches, a 12 cm infusion length EKOS catheter was placed over a Rosen wire. Wire was removed, small amount of contrast confirmed position and catheter was flushed. The electronic wires were then placed through both the left and right catheters. Sheaths were secured in position.  Final image was stored. The patient tolerated the procedure well and remained hemodynamically stable throughout. No complications were encountered and no significant blood loss was encountered. FINDINGS: Ultrasound survey demonstrates patent right common femoral vein. Main pulmonary artery pressure measures 47/9 (26). Final image demonstrates right-sided catheter, 18 cm infusion length, directed into lower lobar branches and left sided catheter, 12 cm infusion length, directed into lower lobar branches. The cranial 6 French (green) sheaths transmits the right-sided catheter. The caudal 6 French (green) sheath transmits the left-sided pulmonary artery catheter.  IMPRESSION: Status post placement of left and right pulmonary artery EKOS ultrasound accelerated catheters for initiation of catheter directed thrombolysis. Signed, Dulcy Fanny. Earleen Newport DO [facility] Radiology PLAN: Patient will be ICU status throughout. Bed rest. Both the right and left catheter will receive 1 mg tPA per hour for 12 hours, for a total dose of 24 mg over 12 hours. Plan for repeat trans duction of catheter pressures after treatment, reassessment, and probable removal of catheters. EVery 6 our blood draw, including CBC, heparin level, and fibrinogen. Electronically Signed   By: Corrie Mckusick D.O.   On: 12/10/2017 10:14   Ir Angiogram Selective Each Additional Vessel  Result Date: 12/10/2017 INDICATION: 62 year old female presents with submassive pulmonary embolism EXAM: ULTRASOUND GUIDED ACCESS RIGHT COMMON FEMORAL VEIN TIMES TO ACCESS PULMONARY ANGIOGRAM PLACEMENT OF ULTRASOUND ACCELERATED LYSIS INFUSION CATHETERS FOR PE LYSIS  COMPARISON:  CT 12/10/2017 MEDICATIONS: None. ANESTHESIA/SEDATION: Versed 2.0 mg IV; Fentanyl 50 mcg IV Moderate Sedation Time:  44 minutes The patient was continuously monitored during the procedure by the interventional radiology nurse under my direct supervision. FLUOROSCOPY TIME:  Fluoroscopy Time: 9 minutes 36 seconds (73 mGy). COMPLICATIONS: None TECHNIQUE: Informed consent was obtained from the patient and the patient's family following explanation of the procedure, risks, benefits and alternatives. Specific risks include bleeding, infection, contrast reaction, kidney injury, venous injury, life-threatening hemorrhage including brain hemorrhage, gastrointestinal hemorrhage, epistaxis, need for further surgery, need for further procedure, cardiopulmonary collapse, death. The patient understands, agrees and consents for the procedure. All questions were addressed. Patient is position supine position on the fluoroscopy  table. Maximal barrier sterile technique utilized including caps, mask, sterile gowns, sterile gloves, large sterile drape, hand hygiene, and betadine prep. 1% lidocaine used for local anesthesia. Moderate sedation was provided. Ultrasound survey of the right inguinal region was performed with images stored and sent to PACs. A single wall needle was used access the right common femoral vein under ultrasound. With venous blood flow returned, an 035 wire was passed through the needle into the IVC. A 6 French sheath was placed over the wire. The dilator was removed and the sheath was flushed. Subsequently, a single wall needle was used access the right common femoral vein adjacent to the initial puncture. With venous blood flow returned, a 6 [place] vascular sheath was placed over the wire. The dilator was removed and the sheath was flushed. Combination of a 5 French pigtail catheter, vertebral catheter and a Bentson wire was then used to select the main pulmonary artery. Bentson wire, Glidewire, and vertebral catheter was then used to navigate into the right-sided pulmonary veins, with selection of lower lobe pulmonary vein. Glidewire was used to advance into a more distal position and the vertebral catheter was placed into medial segments. Vertebral catheter was removed and a 18 cm infusion length EKOS ultrasound accelerated catheter was placed over the Glidewire. Small contrast infusion confirmed position. Catheter was flushed. Combination of a 5 French pigtail catheter, vertebral catheter and a Bentson wire was then used to select the main pulmonary artery through the second sheath. Wire was removed and a main pulmonary artery pressure transduction was recorded. Once the catheter was positioned into the lower lobar branches, a 12 cm infusion length EKOS catheter was placed over a Rosen wire. Wire was removed, small amount of contrast confirmed position and catheter was flushed. The electronic wires were then placed  through both the left and right catheters. Sheaths were secured in position.  Final image was stored. The patient tolerated the procedure well and remained hemodynamically stable throughout. No complications were encountered and no significant blood loss was encountered. FINDINGS: Ultrasound survey demonstrates patent right common femoral vein. Main pulmonary artery pressure measures 47/9 (26). Final image demonstrates right-sided catheter, 18 cm infusion length, directed into lower lobar branches and left sided catheter, 12 cm infusion length, directed into lower lobar branches. The cranial 6 French (green) sheaths transmits the right-sided catheter. The caudal 6 French (green) sheath transmits the left-sided pulmonary artery catheter. IMPRESSION: Status post placement of left and right pulmonary artery EKOS ultrasound accelerated catheters for initiation of catheter directed thrombolysis. Signed, Dulcy Fanny. Earleen Newport DO [facility] Radiology PLAN: Patient will be ICU status throughout. Bed rest. Both the right and left catheter will receive 1 mg tPA per hour for 12 hours, for a total dose of 24  mg over 12 hours. Plan for repeat trans duction of catheter pressures after treatment, reassessment, and probable removal of catheters. EVery 6 our blood draw, including CBC, heparin level, and fibrinogen. Electronically Signed   By: Corrie Mckusick D.O.   On: 12/10/2017 10:14   Ir Angiogram Selective Each Additional Vessel  Result Date: 12/10/2017 INDICATION: 62 year old female presents with submassive pulmonary embolism EXAM: ULTRASOUND GUIDED ACCESS RIGHT COMMON FEMORAL VEIN TIMES TO ACCESS PULMONARY ANGIOGRAM PLACEMENT OF ULTRASOUND ACCELERATED LYSIS INFUSION CATHETERS FOR PE LYSIS COMPARISON:  CT 12/10/2017 MEDICATIONS: None. ANESTHESIA/SEDATION: Versed 2.0 mg IV; Fentanyl 50 mcg IV Moderate Sedation Time:  44 minutes The patient was continuously monitored during the  procedure by the interventional radiology nurse under my direct supervision. FLUOROSCOPY TIME:  Fluoroscopy Time: 9 minutes 36 seconds (73 mGy). COMPLICATIONS: None TECHNIQUE: Informed consent was obtained from the patient and the patient's family following explanation of the procedure, risks, benefits and alternatives. Specific risks include bleeding, infection, contrast reaction, kidney injury, venous injury, life-threatening hemorrhage including brain hemorrhage, gastrointestinal hemorrhage, epistaxis, need for further surgery, need for further procedure, cardiopulmonary collapse, death. The patient understands, agrees and consents for the procedure. All questions were addressed. Patient is position supine position on the fluoroscopy table. Maximal barrier sterile technique utilized including caps, mask, sterile gowns, sterile gloves, large sterile drape, hand hygiene, and betadine prep. 1% lidocaine used for local anesthesia. Moderate sedation was provided. Ultrasound survey of the right inguinal region was performed with images stored and sent to PACs. A single wall needle was used access the right common femoral vein under ultrasound. With venous blood flow returned, an 035 wire was passed through the needle into the IVC. A 6 French sheath was placed over the wire. The dilator was removed and the sheath was flushed. Subsequently, a single wall needle was used access the right common femoral vein adjacent to the initial puncture. With venous blood flow returned, a 6 [place] vascular sheath was placed over the wire. The dilator was removed and the sheath was flushed. Combination of a 5 French pigtail catheter, vertebral catheter and a Bentson wire was then used to select the main pulmonary artery. Bentson wire, Glidewire, and vertebral catheter was then used to navigate into the right-sided pulmonary veins, with selection of lower lobe pulmonary vein. Glidewire was used to advance into a more distal position and  the vertebral catheter was placed into medial segments. Vertebral catheter was removed and a 18 cm infusion length EKOS ultrasound accelerated catheter was placed over the Glidewire. Small contrast infusion confirmed position. Catheter was flushed. Combination of a 5 French pigtail catheter, vertebral catheter and a Bentson wire was then used to select the main pulmonary artery through the second sheath. Wire was removed and a main pulmonary artery pressure transduction was recorded. Once the catheter was positioned into the lower lobar branches, a 12 cm infusion length EKOS catheter was placed over a Rosen wire. Wire was removed, small amount of contrast confirmed position and catheter was flushed. The electronic wires were then placed through both the left and right catheters. Sheaths were secured in position.  Final image was stored. The patient tolerated the procedure well and remained hemodynamically stable throughout. No complications were encountered and no significant blood loss was encountered. FINDINGS: Ultrasound survey demonstrates patent right common femoral vein. Main pulmonary artery pressure measures 47/9 (26). Final image demonstrates right-sided catheter, 18 cm infusion length, directed into lower lobar branches and left sided catheter, 12 cm infusion length, directed into  lower lobar branches. The cranial 6 French (green) sheaths transmits the right-sided catheter. The caudal 6 French (green) sheath transmits the left-sided pulmonary artery catheter. IMPRESSION: Status post placement of left and right pulmonary artery EKOS ultrasound accelerated catheters for initiation of catheter directed thrombolysis. Signed, Dulcy Fanny. Earleen Newport DO [facility] Radiology PLAN: Patient will be ICU status throughout. Bed rest. Both the right and left catheter will receive 1 mg tPA per hour for 12 hours, for a total dose of 24 mg over 12 hours. Plan for repeat trans  duction of catheter pressures after treatment, reassessment, and probable removal of catheters. EVery 6 our blood draw, including CBC, heparin level, and fibrinogen. Electronically Signed   By: Corrie Mckusick D.O.   On: 12/10/2017 10:14   Ir US Guide Vasc Access Right  Result Date: 12/10/2017 INDICATION: 62 year old female presents with submassive pulmonary embolism EXAM: ULTRASOUND GUIDED ACCESS RIGHT COMMON FEMORAL VEIN TIMES TO ACCESS PULMONARY ANGIOGRAM PLACEMENT OF ULTRASOUND ACCELERATED LYSIS INFUSION CATHETERS FOR PE LYSIS COMPARISON:  CT 12/10/2017 MEDICATIONS: None. ANESTHESIA/SEDATION: Versed 2.0 mg IV; Fentanyl 50 mcg IV Moderate Sedation Time:  44 minutes The patient was continuously monitored during the procedure by the interventional radiology nurse under my direct supervision. FLUOROSCOPY TIME:  Fluoroscopy Time: 9 minutes 36 seconds (73 mGy). COMPLICATIONS: None TECHNIQUE: Informed consent was obtained from the patient and the patient's family following explanation of the procedure, risks, benefits and alternatives. Specific risks include bleeding, infection, contrast reaction, kidney injury, venous injury, life-threatening hemorrhage including brain hemorrhage, gastrointestinal hemorrhage, epistaxis, need for further surgery, need for further procedure, cardiopulmonary collapse, death. The patient understands, agrees and consents for the procedure. All questions were addressed. Patient is position supine position on the fluoroscopy table. Maximal barrier sterile technique utilized including caps, mask, sterile gowns, sterile gloves, large sterile drape, hand hygiene, and betadine prep. 1% lidocaine used for local anesthesia. Moderate sedation was provided. Ultrasound survey of the right inguinal region was performed with images stored and sent to PACs. A single wall needle was used access the right common femoral vein under ultrasound. With venous blood flow returned, an 035 wire was passed  through the needle into the IVC. A 6 French sheath was placed over the wire. The dilator was removed and the sheath was flushed. Subsequently, a single wall needle was used access the right common femoral vein adjacent to the initial puncture. With venous blood flow returned, a 6 [place] vascular sheath was placed over the wire. The dilator was removed and the sheath was flushed. Combination of a 5 French pigtail catheter, vertebral catheter and a Bentson wire was then used to select the main pulmonary artery. Bentson wire, Glidewire, and vertebral catheter was then used to navigate into the right-sided pulmonary veins, with selection of lower lobe pulmonary vein. Glidewire was used to advance into a more distal position and the vertebral catheter was placed into medial segments. Vertebral catheter was removed and a 18 cm infusion length EKOS ultrasound accelerated catheter was placed over the Glidewire. Small contrast infusion confirmed position. Catheter was flushed. Combination of a 5 French pigtail catheter, vertebral catheter and a Bentson wire was then used to select the main pulmonary artery through the second sheath. Wire was removed and a main pulmonary artery pressure transduction was recorded. Once the catheter was positioned into the lower lobar branches, a 12 cm infusion length EKOS catheter was placed over a Rosen wire. Wire was removed, small amount of contrast confirmed position and  catheter was flushed. The electronic wires were then placed through both the left and right catheters. Sheaths were secured in position.  Final image was stored. The patient tolerated the procedure well and remained hemodynamically stable throughout. No complications were encountered and no significant blood loss was encountered. FINDINGS: Ultrasound survey demonstrates patent right common femoral vein. Main pulmonary artery pressure measures 47/9 (26). Final image demonstrates right-sided catheter, 18 cm infusion length,  directed into lower lobar branches and left sided catheter, 12 cm infusion length, directed into lower lobar branches. The cranial 6 French (green) sheaths transmits the right-sided catheter. The caudal 6 French (green) sheath transmits the left-sided pulmonary artery catheter. IMPRESSION: Status post placement of left and right pulmonary artery EKOS ultrasound accelerated catheters for initiation of catheter directed thrombolysis. Signed, Dulcy Fanny. Earleen Newport DO [facility] Radiology PLAN: Patient will be ICU status throughout. Bed rest. Both the right and left catheter will receive 1 mg tPA per hour for 12 hours, for a total dose of 24 mg over 12 hours. Plan for repeat trans duction of catheter pressures after treatment, reassessment, and probable removal of catheters. EVery 6 our blood draw, including CBC, heparin level, and fibrinogen. Electronically Signed   By: Corrie Mckusick D.O.   On: 12/10/2017 10:14   Dg Chest Port 1 View  Result Date: 12/12/2017 CLINICAL DATA:  Respiratory distress. EXAM: PORTABLE CHEST 1 VIEW COMPARISON:  Chest radiographs 12/09/2017 and CTA 12/10/2017 FINDINGS: The cardiac silhouette is mildly enlarged. The lungs are hypoinflated with new central pulmonary vascular congestion. There is no overt edema. No sizable pleural effusion or pneumothorax is identified. Thoracic spondylosis is noted. IMPRESSION: Hypoinflation with pulmonary vascular congestion. Electronically Signed   By: Logan Bores M.D.   On: 12/12/2017 10:26   Ct Venogram Abd/pel  Result Date: 12/10/2017 CLINICAL DATA:  Submassive pulmonary embolism. 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 EXAM: CT VENOGRAM ABD-PELVIS TECHNIQUE: Multidetector CT imaging of the abdomen and pelvis was performed using the standard protocol during bolus administration of intravenous contrast. Multiplanar reconstructed images and MIPs were obtained and reviewed to evaluate the vascular anatomy. CONTRAST:  150mL  ISOVUE-300 IOPAMIDOL (ISOVUE-300) INJECTION 61% COMPARISON:  01/05/2005 FINDINGS: VASCULAR Veins: Delayed phase imaging demonstrates no evidence of DVT. Specifically there is no evidence of thrombus within the IVC. Some mixing artifact is noted. The abnormality on the accompanying chest CT represents artifact. Arterial: Portal venous phase imaging demonstrates atherosclerotic changes of the abdominal aorta. Maximal diameter of the abdominal aorta is 3.1 cm. 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 There is ectasia of the right internal iliac artery. Review of the MIP images confirms the above findings. NON-VASCULAR Lower chest: Mild dependent atelectasis. Hepatobiliary: Postcholecystectomy. Tiny cyst in the right lobe inferiorly on image 33 of series 3. Pancreas: Unremarkable Spleen: Unremarkable Adrenals/Urinary Tract: Kidneys are within normal limits. There is contrast in the collecting system from the previous scan. Tiny cysts are present in the kidneys. Adrenal glands are within normal limits. Bladder is decompressed by Foley catheter. Stomach/Bowel: Stomach and duodenum are within normal limits. There is no evidence of small-bowel obstruction. Normal appendix. No obvious mass in the colon. Lymphatic: No abnormal retroperitoneal adenopathy. Reproductive: Uterus and adnexa are within normal limits. Other: Tiny sebaceous cyst in the ischial rectal fossa on image 92 of series 3. No free-fluid. Musculoskeletal: No vertebral compression deformity. Advanced multilevel degenerative disc disease in the lumbar spine. IMPRESSION:  VASCULAR There is no evidence of DVT on this study. Specifically, there is no evidence of thrombus in the IVC. The finding and noted on the accompanying chest CT represents artifact. Abdominal aortic aneurysm maximal diameter is 3.1 cm. Recommend followup by ultrasound in 3 years. This  recommendation follows ACR consensus guidelines: White Paper of the ACR Incidental Findings Committee II on Vascular Findings. 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 NON-VASCULAR Chronic changes. Electronically Signed   By: Marybelle Killings M.D.   On: 12/10/2017 06:38   Ir Infusion Thrombol Arterial Initial (ms)  Result Date: 12/10/2017 INDICATION: 62 year old female presents with submassive pulmonary embolism EXAM: ULTRASOUND GUIDED ACCESS RIGHT COMMON FEMORAL VEIN TIMES TO ACCESS PULMONARY ANGIOGRAM PLACEMENT OF ULTRASOUND ACCELERATED LYSIS INFUSION CATHETERS FOR PE LYSIS COMPARISON:  CT 12/10/2017 MEDICATIONS: None. ANESTHESIA/SEDATION: Versed 2.0 mg IV; Fentanyl 50 mcg IV Moderate Sedation Time:  44 minutes The patient was continuously monitored during the procedure by the interventional radiology nurse under my direct supervision. FLUOROSCOPY TIME:  Fluoroscopy Time: 9 minutes 36 seconds (73 mGy). COMPLICATIONS: None TECHNIQUE: Informed consent was obtained from the patient and the patient's family following explanation of the procedure, risks, benefits and alternatives. Specific risks include bleeding, infection, contrast reaction, kidney injury, venous injury, life-threatening hemorrhage including brain hemorrhage, gastrointestinal hemorrhage, epistaxis, need for further surgery, need for further procedure, cardiopulmonary collapse, death. The patient understands, agrees and consents for the procedure. All questions were addressed. Patient is position supine position on the fluoroscopy table. Maximal barrier sterile technique utilized including caps, mask, sterile gowns, sterile gloves, large sterile drape, hand hygiene, and betadine prep. 1% lidocaine used for local anesthesia. Moderate sedation was provided. Ultrasound survey of the right inguinal region was performed with images stored and sent to PACs. A single wall needle was used access the right common  femoral vein under ultrasound. With venous blood flow returned, an 035 wire was passed through the needle into the IVC. A 6 French sheath was placed over the wire. The dilator was removed and the sheath was flushed. Subsequently, a single wall needle was used access the right common femoral vein adjacent to the initial puncture. With venous blood flow returned, a 6 [place] vascular sheath was placed over the wire. The dilator was removed and the sheath was flushed. Combination of a 5 French pigtail catheter, vertebral catheter and a Bentson wire was then used to select the main pulmonary artery. Bentson wire, Glidewire, and vertebral catheter was then used to navigate into the right-sided pulmonary veins, with selection of lower lobe pulmonary vein. Glidewire was used to advance into a more distal position and the vertebral catheter was placed into medial segments. Vertebral catheter was removed and a 18 cm infusion length EKOS ultrasound accelerated catheter was placed over the Glidewire. Small contrast infusion confirmed position. Catheter was flushed. Combination of a 5 French pigtail catheter, vertebral catheter and a Bentson wire was then used to select the main pulmonary artery through the second sheath. Wire was removed and a main pulmonary artery pressure transduction was recorded. Once the catheter was positioned into the lower lobar branches, a 12 cm infusion length EKOS catheter was placed over a Rosen wire. Wire was removed, small amount of contrast confirmed position and catheter was flushed. The electronic wires were then placed through both the left and right catheters. Sheaths were secured in position.  Final image was stored. The patient tolerated the procedure well and remained hemodynamically stable throughout. No  complications were encountered and no significant blood loss was encountered. FINDINGS: Ultrasound survey demonstrates patent right common femoral vein. Main pulmonary artery pressure  measures 47/9 (26). Final image demonstrates right-sided catheter, 18 cm infusion length, directed into lower lobar branches and left sided catheter, 12 cm infusion length, directed into lower lobar branches. The cranial 6 French (green) sheaths transmits the right-sided catheter. The caudal 6 French (green) sheath transmits the left-sided pulmonary artery catheter. IMPRESSION: Status post placement of left and right pulmonary artery EKOS ultrasound accelerated catheters for initiation of catheter directed thrombolysis. Signed, Dulcy Fanny. Earleen Newport DO [facility] Radiology PLAN: Patient will be ICU status throughout. Bed rest. Both the right and left catheter will receive 1 mg tPA per hour for 12 hours, for a total dose of 24 mg over 12 hours. Plan for repeat trans duction of catheter pressures after treatment, reassessment, and probable removal of catheters. EVery 6 our blood draw, including CBC, heparin level, and fibrinogen. Electronically Signed   By: Corrie Mckusick D.O.   On: 12/10/2017 10:14   Ir Infusion Thrombol Arterial Initial (ms)  Result Date: 12/10/2017 INDICATION: 62 year old female presents with submassive pulmonary embolism EXAM: ULTRASOUND GUIDED ACCESS RIGHT COMMON FEMORAL VEIN TIMES TO ACCESS PULMONARY ANGIOGRAM PLACEMENT OF ULTRASOUND ACCELERATED LYSIS INFUSION CATHETERS FOR PE LYSIS COMPARISON:  CT 12/10/2017 MEDICATIONS: None. ANESTHESIA/SEDATION: Versed 2.0 mg IV; Fentanyl 50 mcg IV Moderate Sedation Time:  44 minutes The patient was continuously monitored during the procedure by the interventional radiology nurse under my direct supervision. FLUOROSCOPY TIME:  Fluoroscopy Time: 9 minutes 36 seconds (73 mGy). COMPLICATIONS: None TECHNIQUE: Informed consent was obtained from the patient and the patient's family following explanation of the procedure, risks, benefits and alternatives. Specific risks include bleeding, infection, contrast reaction,  kidney injury, venous injury, life-threatening hemorrhage including brain hemorrhage, gastrointestinal hemorrhage, epistaxis, need for further surgery, need for further procedure, cardiopulmonary collapse, death. The patient understands, agrees and consents for the procedure. All questions were addressed. Patient is position supine position on the fluoroscopy table. Maximal barrier sterile technique utilized including caps, mask, sterile gowns, sterile gloves, large sterile drape, hand hygiene, and betadine prep. 1% lidocaine used for local anesthesia. Moderate sedation was provided. Ultrasound survey of the right inguinal region was performed with images stored and sent to PACs. A single wall needle was used access the right common femoral vein under ultrasound. With venous blood flow returned, an 035 wire was passed through the needle into the IVC. A 6 French sheath was placed over the wire. The dilator was removed and the sheath was flushed. Subsequently, a single wall needle was used access the right common femoral vein adjacent to the initial puncture. With venous blood flow returned, a 6 [place] vascular sheath was placed over the wire. The dilator was removed and the sheath was flushed. Combination of a 5 French pigtail catheter, vertebral catheter and a Bentson wire was then used to select the main pulmonary artery. Bentson wire, Glidewire, and vertebral catheter was then used to navigate into the right-sided pulmonary veins, with selection of lower lobe pulmonary vein. Glidewire was used to advance into a more distal position and the vertebral catheter was placed into medial segments. Vertebral catheter was removed and a 18 cm infusion length EKOS ultrasound accelerated catheter was placed over the Glidewire. Small contrast infusion confirmed position. Catheter was flushed. Combination of a 5 French pigtail catheter, vertebral catheter and a Bentson wire was then used to select the main pulmonary  artery  through the second sheath. Wire was removed and a main pulmonary artery pressure transduction was recorded. Once the catheter was positioned into the lower lobar branches, a 12 cm infusion length EKOS catheter was placed over a Rosen wire. Wire was removed, small amount of contrast confirmed position and catheter was flushed. The electronic wires were then placed through both the left and right catheters. Sheaths were secured in position.  Final image was stored. The patient tolerated the procedure well and remained hemodynamically stable throughout. No complications were encountered and no significant blood loss was encountered. FINDINGS: Ultrasound survey demonstrates patent right common femoral vein. Main pulmonary artery pressure measures 47/9 (26). Final image demonstrates right-sided catheter, 18 cm infusion length, directed into lower lobar branches and left sided catheter, 12 cm infusion length, directed into lower lobar branches. The cranial 6 French (green) sheaths transmits the right-sided catheter. The caudal 6 French (green) sheath transmits the left-sided pulmonary artery catheter. IMPRESSION: Status post placement of left and right pulmonary artery EKOS ultrasound accelerated catheters for initiation of catheter directed thrombolysis. Signed, Dulcy Fanny. Earleen Newport DO [facility] Radiology PLAN: Patient will be ICU status throughout. Bed rest. Both the right and left catheter will receive 1 mg tPA per hour for 12 hours, for a total dose of 24 mg over 12 hours. Plan for repeat trans duction of catheter pressures after treatment, reassessment, and probable removal of catheters. EVery 6 our blood draw, including CBC, heparin level, and fibrinogen. Electronically Signed   By: Corrie Mckusick D.O.   On: 12/10/2017 10:14   Ir Jacolyn Reedy F/u Eval Art/ven Final Day (ms)  Result Date: 12/11/2017 INDICATION: Status post completion of 12 hours of bilateral catheter  directed ultrasound assisted thrombolytic therapy to treat bilateral submassive pulmonary embolism. Thrombolytic therapy was complicated by a significant nosebleed and some associated drop in hemoglobin. Epistaxis has ceased after completion of thrombolytic therapy. EXAM: FOLLOW-UP OF PULMONARY ARTERIAL THROMBOLYTIC THERAPY, FINAL DAY MEDICATIONS: None ANESTHESIA/SEDATION: None CONTRAST:  None FLUOROSCOPY TIME:  None COMPLICATIONS: None immediate. PROCEDURE: Catheters were retracted into the main pulmonary artery. Repeat pulmonary artery pressures were obtained. FINDINGS: Repeat pulmonary artery pressure is 38/29 (32) mm Hg. Prior to infusion, documented pulmonary artery pressure was 47/9 (26) mm Hg. IMPRESSION: There is some decrease in systolic pulmonary arterial pressure after bilateral ultrasound assisted thrombolytic therapy. Mean pressure did not decrease due to higher measured diastolic pressure. Due to significant epistaxis overnight, further thrombolytic therapy will not be performed. Electronically Signed   By: Aletta Edouard M.D.   On: 12/11/2017 15:46     CBC Recent Labs  Lab 12/09/17 1837 12/10/17 0933 12/10/17 1438 12/10/17 2131 12/11/17 0337 12/12/17 0313  WBC 13.0* 11.2* 10.6* 11.7* 11.6* 16.5*  HGB 11.0* 9.6* 9.4* 9.3* 8.3* 9.4*  HCT 33.6* 30.2* 29.5* 29.2* 25.9* 29.5*  PLT 199 157 165 161 134* 167  MCV 84.6 84.6 84.8 84.6 84.1 85.5  MCH 27.7 26.9 27.0 27.0 26.9 27.2  MCHC 32.7 31.8 31.9 31.8 32.0 31.9  RDW 14.7 14.8 15.0 14.9 14.9 15.4  LYMPHSABS 2.0  --   --   --   --   --   MONOABS 1.0  --   --   --   --   --   EOSABS 0.0  --   --   --   --   --   BASOSABS 0.0  --   --   --   --   --  Chemistries  Recent Labs  Lab 12/09/17 1837 12/11/17 0423 12/12/17 0313  NA 141 144 140  K 4.1 2.9* 4.9  CL 110 120* 110  CO2 22 19* 18*  GLUCOSE 130* 82 64*  BUN 22* 9 6  CREATININE 1.24* 0.64 0.78  CALCIUM 8.3* 6.5* 8.5*  MG 2.0 1.4* 2.1  AST 15  --   --   ALT 16  --    --   ALKPHOS 71  --   --   BILITOT 0.8  --   --    ------------------------------------------------------------------------------------------------------------------ estimated creatinine clearance is 76.3 mL/min (by C-G formula based on SCr of 0.78 mg/dL). ------------------------------------------------------------------------------------------------------------------ No results for input(s): HGBA1C in the last 72 hours. ------------------------------------------------------------------------------------------------------------------ No results for input(s): CHOL, HDL, LDLCALC, TRIG, CHOLHDL, LDLDIRECT in the last 72 hours. ------------------------------------------------------------------------------------------------------------------ No results for input(s): TSH, T4TOTAL, T3FREE, THYROIDAB in the last 72 hours.  Invalid input(s): FREET3 ------------------------------------------------------------------------------------------------------------------ No results for input(s): VITAMINB12, FOLATE, FERRITIN, TIBC, IRON, RETICCTPCT in the last 72 hours.  Coagulation profile Recent Labs  Lab 12/09/17 1837  INR 1.18    No results for input(s): DDIMER in the last 72 hours.  Cardiac Enzymes No results for input(s): CKMB, TROPONINI, MYOGLOBIN in the last 168 hours.  Invalid input(s): CK ------------------------------------------------------------------------------------------------------------------ Invalid input(s): POCBNP   CBG: Recent Labs  Lab 12/10/17 0422 12/10/17 0717 12/11/17 1235 12/12/17 0828 12/12/17 1509  GLUCAP 85 73 109* 145* 135*       Studies: Dg Chest Port 1 View  Result Date: 12/12/2017 CLINICAL DATA:  Respiratory distress. EXAM: PORTABLE CHEST 1 VIEW COMPARISON:  Chest radiographs 12/09/2017 and CTA 12/10/2017 FINDINGS: The cardiac silhouette is mildly enlarged. The lungs are hypoinflated with new central pulmonary vascular congestion. There is no overt  edema. No sizable pleural effusion or pneumothorax is identified. Thoracic spondylosis is noted. IMPRESSION: Hypoinflation with pulmonary vascular congestion. Electronically Signed   By: Logan Bores M.D.   On: 12/12/2017 10:26   Ir Jacolyn Reedy F/u Eval Art/ven Final Day (ms)  Result Date: 12/11/2017 INDICATION: Status post completion of 12 hours of bilateral catheter directed ultrasound assisted thrombolytic therapy to treat bilateral submassive pulmonary embolism. Thrombolytic therapy was complicated by a significant nosebleed and some associated drop in hemoglobin. Epistaxis has ceased after completion of thrombolytic therapy. EXAM: FOLLOW-UP OF PULMONARY ARTERIAL THROMBOLYTIC THERAPY, FINAL DAY MEDICATIONS: None ANESTHESIA/SEDATION: None CONTRAST:  None FLUOROSCOPY TIME:  None COMPLICATIONS: None immediate. PROCEDURE: Catheters were retracted into the main pulmonary artery. Repeat pulmonary artery pressures were obtained. FINDINGS: Repeat pulmonary artery pressure is 38/29 (32) mm Hg. Prior to infusion, documented pulmonary artery pressure was 47/9 (26) mm Hg. IMPRESSION: There is some decrease in systolic pulmonary arterial pressure after bilateral ultrasound assisted thrombolytic therapy. Mean pressure did not decrease due to higher measured diastolic pressure. Due to significant epistaxis overnight, further thrombolytic therapy will not be performed. Electronically Signed   By: Aletta Edouard M.D.   On: 12/11/2017 15:46      No results found for: HGBA1C Lab Results  Component Value Date   CREATININE 0.78 12/12/2017       Scheduled Meds: . carvedilol  12.5 mg Oral BID WC   Continuous Infusions: . sodium chloride Stopped (12/11/17 1200)  . sodium chloride Stopped (12/11/17 1200)  . heparin 1,100 Units/hr (12/13/17 0726)     LOS: 3 days    Time spent: >30 MINS    Reyne Dumas  [facility] Pager [phone]. If 7PM-7AM, please contact night-coverage at [email or web address], password  [facility] 12/13/2017,  8:29 AM  LOS: 3 days

## 2017-12-13 NOTE — NC FL2 (Signed)
  [place] LEVEL OF CARE SCREENING TOOL     IDENTIFICATION  Patient Name: Kristi Meyers Birthdate: 04/30/1956 Sex: female Admission Date (Current Location): 12/09/2017  [facility] and [place] Number:  Herbalist and Address:  The [place], [place] [address] [place], [place] [address]      Provider Number: [id number]  Attending Physician Name and Address:  Reyne Dumas, MD  Relative Name and Phone Number:       Current Level of Care: Hospital Recommended Level of Care: [place] Prior Approval Number:    Date Approved/Denied:   PASRR Number: [id number]  Discharge Plan: SNF    Current Diagnoses: Patient Active Problem List   Diagnosis Date Noted  . Respiratory distress   . Acute saddle pulmonary embolus (HCC) 12/10/2017    Orientation RESPIRATION BLADDER Height & Weight     Self, Time, Situation, Place  Normal Continent Weight: 208 lb 15.9 oz (94.8 kg) Height:  5\' 2"  (157.5 cm)  BEHAVIORAL SYMPTOMS/MOOD NEUROLOGICAL BOWEL NUTRITION STATUS      Continent Diet(cardiac)  AMBULATORY STATUS COMMUNICATION OF NEEDS Skin   Extensive Assist Verbally Normal                       Personal Care Assistance Level of Assistance  Bathing, Dressing Bathing Assistance: Maximum assistance   Dressing Assistance: Maximum assistance     Functional Limitations Info             SPECIAL CARE FACTORS FREQUENCY  PT (By licensed PT), OT (By licensed OT)     PT Frequency: 5/wk OT Frequency: 5/wk            Contractures      Additional Factors Info  Code Status, Allergies Code Status Info: FULL Allergies Info: NKA           Current Medications (12/13/2017):  This is the current hospital active medication list Current Facility-Administered Medications  Medication Dose Route Frequency Provider Last Rate Last Dose  . 0.9 %  sodium chloride infusion  250 mL Intravenous PRN Hammonds, Sharyn Blitz, MD    Stopped at 12/11/17 1200  . 0.9 %  sodium chloride infusion  250 mL Intravenous PRN Corrie Mckusick, DO   Stopped at 12/11/17 1200  . acetaminophen (TYLENOL) tablet 650 mg  650 mg Oral Q4H PRN Hammonds, Sharyn Blitz, MD   650 mg at 12/13/17 0544  . albuterol (PROVENTIL) (2.5 MG/3ML) 0.083% nebulizer solution 2.5 mg  2.5 mg Nebulization Q3H PRN Shearon Stalls, Rahul P, PA-C   2.5 mg at 12/11/17 2017  . carvedilol (COREG) tablet 12.5 mg  12.5 mg Oral BID WC Riccio, Angela C, DO   12.5 mg at 12/13/17 0737  . heparin ADULT infusion 100 units/mL (25000 units/274mL sodium chloride 0.45%)  1,100 Units/hr Intravenous Continuous Erenest Blank, RPH 11 mL/hr at 12/13/17 0726 1,100 Units/hr at 12/13/17 5974     Discharge Medications: Please see discharge summary for a list of discharge medications.  Relevant Imaging Results:  Relevant Lab Results:   Additional Information SS#: [id number]  Jorge Ny, LCSW

## 2017-12-13 NOTE — Progress Notes (Signed)
CSW provided pt and pt dtr with bed offers- reiterated that quick decision would give [place] the best chance at being able to go to facility of choice  Pt dtr planning to call CSW once decision is made.  Jorge Ny, LCSW Clinical Social Worker [phone]

## 2017-12-13 NOTE — Progress Notes (Signed)
Kristi Meyers for IV heparin Indication: pulmonary embolus  Patient Measurements: Height: 5\' 2"  (157.5 cm) Weight: 208 lb 15.9 oz (94.8 kg) IBW/kg (Calculated) : 50.1 Heparin Dosing Weight: 66 kg  Vital Signs: Temp: 97.7 F (36.5 C) (02/28 0547) Temp Source: Oral (02/28 0547) BP: 147/88 (02/28 0737) Pulse Rate: 93 (02/28 0737)  Labs: Recent Labs    12/11/17 0337 12/11/17 0423 12/12/17 0023 12/12/17 0313 12/12/17 1053 12/13/17 1130  HGB 8.3*  --   --  9.4*  --  8.5*  HCT 25.9*  --   --  29.5*  --  25.7*  PLT 134*  --   --  167  --  203  HEPARINUNFRC 0.31  --  0.28*  --  0.35 0.30  CREATININE  --  0.64  --  0.78  --   --    Assessment: 64 yoF c/o weakness, fatigue and hypotension and found to have large occlusive saddle PE with right heart strain. Pharmacy consulted for heparin dosing.  Heparin level remains therapeutic at low end of goal. Catheter-directed alteplase completed 2/26. Reported nose bleeding on 2/27 have resolved. HgB remains low but stable. Dr. Allyson Meyers wants to continue heparin gtt for another day then reassess transitioning to Eliquis tomorrow.    Goal of Therapy:  Heparin level 0.3-0.7 units/ml Monitor platelets by anticoagulation protocol: Yes   Plan:  Increase heparin to 1150 units/hr  Daily heparin level and CBC Monitor for s/sx of bleeding  Kristi Meyers, PharmD Clinical Pharmacist 12/13/2017 12:56 PM

## 2017-12-13 NOTE — Evaluation (Signed)
Occupational Therapy Evaluation Patient Details Name: Kristi Meyers MRN: [id number] DOB: 1956-04-22 Today's Date: 12/13/2017    History of Present Illness 20yoF with history of HTN, HLD, and GERD, presents to the ER tonight c/o SOB, Dizzyness, and Generalized weakness x 2 days. She also c/o constipation. She denies Cough, CP, Fever, or LE edema. In the ER she was found to be in shock with BP 70/42, which has since improved to 104/77 after 2L IVF. She reports her SBP as an outpatient is typically in 170's. CTA revealed bilateral PE's in right and left main PA's as well as saddle embolus and DVT within the IVF at the hepatic level. She was started on a Heparin.  PMH: GERD, HTN and vertigo.   Clinical Impression   Pt is typically independent in ADL, IADL and drives. She was using a cane in the weeks leading up to admission. Pt presents with significant, generalized weakness requiring max assist for bed mobility and to stand from the bed. She managed to take a few side steps along the EOB with moderate assistance. She needs min to total assist for ADL. Pt demonstrates poor activity tolerance. She will need and is agreeable to post acute rehab in SNF. Will follow acutely.    Follow Up Recommendations  SNF;Supervision/Assistance - 24 hour    Equipment Recommendations       Recommendations for Other Services       Precautions / Restrictions Precautions Precautions: Fall Restrictions Weight Bearing Restrictions: No      Mobility Bed Mobility Overal bed mobility: Needs Assistance Bed Mobility: Supine to Sit;Sit to Supine     Supine to sit: Max assist Sit to supine: Mod assist   General bed mobility comments: assist for LEs over EOB and to raise trunk, assist for LEs back into bed  Transfers Overall transfer level: Needs assistance Equipment used: Rolling walker (2 wheeled) Transfers: Sit to/from Stand Sit to Stand: Max assist         General transfer comment: cues for hand  placement, max assist to rise and steady, pt took side steps to [facility] with moderate assist    Balance Overall balance assessment: Needs assistance   Sitting balance-Leahy Scale: Fair Sitting balance - Comments: statically     Standing balance-Leahy Scale: Poor Standing balance comment: relies on B UE support for balance                           ADL either performed or assessed with clinical judgement   ADL Overall ADL's : Needs assistance/impaired Eating/Feeding: Minimal assistance;Sitting Eating/Feeding Details (indicate cue type and reason): drinking with straw Grooming: Wash/dry hands;Wash/dry face;Sitting;Minimal assistance   Upper Body Bathing: Moderate assistance;Sitting   Lower Body Bathing: Total assistance;Sit to/from stand   Upper Body Dressing : Minimal assistance;Sitting   Lower Body Dressing: Total assistance;Sit to/from stand       Toileting- Water quality scientist and Hygiene: Total assistance;Sit to/from stand               Vision Baseline Vision/History: Wears glasses Wears Glasses: At all times Patient Visual Report: No change from baseline       Perception     Praxis      Pertinent Vitals/Pain Pain Assessment: No/denies pain     Hand Dominance Right   Extremity/Trunk Assessment Upper Extremity Assessment Upper Extremity Assessment: Generalized weakness   Lower Extremity Assessment Lower Extremity Assessment: Defer to PT evaluation  Communication Communication Communication: No difficulties   Cognition Arousal/Alertness: Awake/alert Behavior During Therapy: Flat affect Overall Cognitive Status: Within Functional Limits for tasks assessed                                 General Comments: pt verbalizing minimally   General Comments       Exercises     Shoulder Instructions      Home Living Family/patient expects to be discharged to:: Private residence Living Arrangements: Other  relatives Available Help at Discharge: Family;Available PRN/intermittently Type of Home: Apartment Home Access: Stairs to enter Entrance Stairs-Number of Steps: flight Entrance Stairs-Rails: Right Home Layout: One level     Bathroom Shower/Tub: Teacher, early years/pre: Standard     Home Equipment: Environmental consultant - 2 wheels;Bedside commode;Cane - single point          Prior Functioning/Environment Level of Independence: Independent with assistive device(s)        Comments: was using cane for last 4 weeks due to weakness per pt, could B/D herself, drives and cares for her grandchildren after school         OT Problem List: Decreased strength;Decreased activity tolerance;Impaired balance (sitting and/or standing);Decreased knowledge of use of DME or AE;Obesity      OT Treatment/Interventions: Self-care/ADL training;DME and/or AE instruction;Therapeutic exercise;Patient/family education;Balance training;Therapeutic activities;Energy conservation    OT Goals(Current goals can be found in the care plan section) Acute Rehab OT Goals Patient Stated Goal: to get stronger OT Goal Formulation: With patient Time For Goal Achievement: 12/27/17 Potential to Achieve Goals: Good ADL Goals Pt Will Perform Grooming: with supervision;sitting Pt Will Perform Upper Body Bathing: with min assist;sitting Pt Will Perform Upper Body Dressing: with min assist;sitting Pt Will Transfer to Toilet: with min assist;ambulating;bedside commode Pt Will Perform Toileting - Clothing Manipulation and hygiene: with min assist;sit to/from stand Pt/caregiver will Perform Home Exercise Program: Increased strength;Both right and left upper extremity;Independently Additional ADL Goal #1: Pt will perform bed mobility with min assist in preparation for ADL.  OT Frequency: Min 2X/week   Barriers to D/C:            Co-evaluation              AM-PAC PT "6 Clicks" Daily Activity     Outcome Measure  Help from another person eating meals?: A Little Help from another person taking care of personal grooming?: A Little Help from another person toileting, which includes using toliet, bedpan, or urinal?: Total Help from another person bathing (including washing, rinsing, drying)?: A Lot Help from another person to put on and taking off regular upper body clothing?: A Lot Help from another person to put on and taking off regular lower body clothing?: Total 6 Click Score: 12   End of Session Equipment Utilized During Treatment: Gait belt;Rolling walker  Activity Tolerance: Patient tolerated treatment well Patient left: in bed;with call bell/phone within reach;with family/visitor present  OT Visit Diagnosis: Unsteadiness on feet (R26.81);Muscle weakness (generalized) (M62.81)                Time: 8527-7824 OT Time Calculation (min): 17 min Charges:  OT General Charges $OT Visit: 1 Visit OT Evaluation $OT Eval Moderate Complexity: 1 Mod G-Codes:     12-17-17 Nestor Lewandowsky, OTR/L Pager: [phone] Werner Lean, [place] December 17, 2017, 12:09 PM

## 2017-12-13 NOTE — Clinical Social Work Note (Signed)
Clinical Social Work Assessment  Patient Details  Name: Kristi Meyers MRN: [id number] Date of Birth: 02-16-1956  Date of referral:  12/13/17               Reason for consult:  Facility Placement                Permission sought to share information with:  Family Supports, Chartered certified accountant granted to share information::  Yes, Verbal Permission Granted  Name::     Teacher, early years/pre::  SNF  Relationship::  dtr  Contact Information:     Housing/Transportation Living arrangements for the past 2 months:  Apartment Source of Information:  Patient, Adult Children Patient Interpreter Needed:  None Criminal Activity/Legal Involvement Pertinent to Current Situation/Hospitalization:  No - Comment as needed Significant Relationships:  Adult Children Lives with:  Self Do you feel safe going back to the place where you live?  No Need for family participation in patient care:  No (Coment)  Care giving concerns:  Pt lives at home alone- normally independent and working full time now very weak and would not have enough support to return home alone.   Social Worker assessment / plan:  CSW spoke with pt and pt dtr at bedside concerning PT recommendation for SNF.  Explain SNF and SNF referral process.  CSW explained need for prior auth from insurance and that picking facility quickly is important to patient being able to transfer to facility of choice.  Employment status:  Therapist, music:  Managed Care PT Recommendations:  [place] / Referral to community resources:  [place]  Patient/Family's Response to care:  Pt and dtr agreeable to SNF- acknowledges that she would not be safe to go home.  Patient/Family's Understanding of and Emotional Response to Diagnosis, Current Treatment, and Prognosis:  Pt overwhelmed by current condition and sad that she will have to go to another inpatient setting prior to return  home.  Emotional Assessment Appearance:  Appears stated age Attitude/Demeanor/Rapport:    Affect (typically observed):  Appropriate Orientation:  Oriented to Self, Oriented to Place, Oriented to  Time, Oriented to Situation Alcohol / Substance use:  Not Applicable Psych involvement (Current and /or in the community):  No (Comment)  Discharge Needs  Concerns to be addressed:  Care Coordination Readmission within the last 30 days:  No Current discharge risk:  Physical Impairment Barriers to Discharge:  Continued Medical Work up   Jorge Ny, LCSW 12/13/2017, 2:31 PM

## 2017-12-14 DIAGNOSIS — N390 Urinary tract infection, site not specified: Secondary | ICD-10-CM

## 2017-12-14 LAB — COMPREHENSIVE METABOLIC PANEL
ALT: 17 U/L (ref 14–54)
AST: 17 U/L (ref 15–41)
Albumin: 2.3 g/dL — ABNORMAL LOW (ref 3.5–5.0)
Alkaline Phosphatase: 60 U/L (ref 38–126)
Anion gap: 10 (ref 5–15)
BUN: 10 mg/dL (ref 6–20)
CO2: 22 mmol/L (ref 22–32)
Calcium: 8.3 mg/dL — ABNORMAL LOW (ref 8.9–10.3)
Chloride: 107 mmol/L (ref 101–111)
Creatinine, Ser: 0.83 mg/dL (ref 0.44–1.00)
GFR calc Af Amer: >60 mL/min (ref >60)
GFR calc non Af Amer: >60 mL/min (ref >60)
Glucose, Bld: 84 mg/dL (ref 65–99)
Potassium: 3.5 mmol/L (ref 3.5–5.1)
Sodium: 139 mmol/L (ref 135–145)
Total Bilirubin: 0.9 mg/dL (ref 0.3–1.2)
Total Protein: 5.6 g/dL — ABNORMAL LOW (ref 6.5–8.1)

## 2017-12-14 LAB — HEPARIN LEVEL (UNFRACTIONATED): Heparin Unfractionated: 0.35 IU/mL (ref 0.30–0.70)

## 2017-12-14 LAB — CBC
HCT: 24.7 % — ABNORMAL LOW (ref 36.0–46.0)
Hemoglobin: 8.2 g/dL — ABNORMAL LOW (ref 12.0–15.0)
MCH: 27.5 pg (ref 26.0–34.0)
MCHC: 33.2 g/dL (ref 30.0–36.0)
MCV: 82.9 fL (ref 78.0–100.0)
Platelets: 254 10*3/uL (ref 150–400)
RBC: 2.98 MIL/uL — ABNORMAL LOW (ref 3.87–5.11)
RDW: 15.1 % (ref 11.5–15.5)
WBC: 15.9 10*3/uL — ABNORMAL HIGH (ref 4.0–10.5)

## 2017-12-14 LAB — GLUCOSE, CAPILLARY: Glucose-Capillary: 77 mg/dL (ref 65–99)

## 2017-12-14 MED ORDER — APIXABAN 5 MG PO TABS
10.0000 mg | ORAL_TABLET | Freq: Two times a day (BID) | ORAL | Status: DC
  Administered 2017-12-14 – 2017-12-15 (×3): 10 mg via ORAL
  Filled 2017-12-14 (×3): qty 2

## 2017-12-14 MED ORDER — APIXABAN 5 MG PO TABS: 5.0000 mg | ORAL_TABLET | Freq: Two times a day (BID) | ORAL | Status: DC

## 2017-12-14 MED ORDER — CEPHALEXIN 500 MG PO CAPS
500.0000 mg | ORAL_CAPSULE | Freq: Four times a day (QID) | ORAL | Status: DC
  Administered 2017-12-14 – 2017-12-15 (×5): 500 mg via ORAL
  Filled 2017-12-14 (×5): qty 1

## 2017-12-14 MED ORDER — CARVEDILOL 25 MG PO TABS
25.0000 mg | ORAL_TABLET | Freq: Two times a day (BID) | ORAL | Status: DC
  Administered 2017-12-14 – 2017-12-15 (×2): 25 mg via ORAL
  Filled 2017-12-14 (×2): qty 1

## 2017-12-14 NOTE — Progress Notes (Signed)
CSW received call from [facility] admissions that patient has received authorization to admit to SNF tomorrow.  CSW will continue to follow.  Laveda Abbe, [place] Clinical Social Worker [phone]

## 2017-12-14 NOTE — Care Management Note (Signed)
Case Management Note  Patient Details  Name: LEETA GRIMME MRN: [id number] Date of Birth: May 21, 1956  Subjective/Objective:  From home alone, pta was indep, now very weak, will need SNF per pt eval.  Daughter has selected [facility] per CSW note. She presents with P.E. And DVT,  She will be on eliquis, NCM gave her the 30 day savings coupon and the $10 co pay card, informed her of the benefit check as well estimate of 30.00 without coupons.  She will be going to SNF for short term and will use the coupons when she goes home from SNF.                   Action/Plan: DC to SNF when medically ready.  Expected Discharge Date:                  Expected Discharge Plan:  Skilled Nursing Facility  In-House Referral:  Clinical Social Work  Discharge planning Services  CM Consult  Post Acute Care Choice:    Choice offered to:     DME Arranged:    DME Agency:     HH Arranged:    [place] Agency:     Status of Service:  In process, will continue to follow  If discussed at Long Length of Stay Meetings, dates discussed:    Additional Comments:  Zenon Mayo, RN 12/14/2017, 2:42 PM

## 2017-12-14 NOTE — Progress Notes (Signed)
[facility] PROGRESS NOTE  Kristi Meyers OBS:[id number] DOB: June 07, 1956 DOA: 12/09/2017   PCP: Billie Ruddy, MD     Assessment/Plan: Active Problems:   Acute saddle pulmonary embolus (HCC)   Respiratory distress  62yoF with history of HTN, HLD, and GERD, presents to the ER tonight c/o SOB, Dizziness , and Generalized weakness x 2 days.   In the ER she was found to be in shock with BP 70/42, which has since improved to 104/77 after 2L IVF. She reports her SBP as an outpatient is typically in 170's. CTA revealed bilateral PE's in right and left main PA's as well as saddle embolus and DVT . She was started on a Heparin gtt. noted to have nosebleed on 2/25.  Status post placement of left and right pulmonary artery EKOS ultrasound accelerated catheters for initiation of catheter directed thrombolysis 2/25. Patient received tPA, still SOB, has had intermittent epistaxis. TRH picked up 12/13/17     Assessment and plan    # Massive PE, with obstructive shock and respiratory failure, status post EKOS  Repeat TEE shows EF of 60-65% with grade 1 diastolic dysfunction, Right ventricle . Systolic function was normal. Patient will need follow up with cardiology and PCP for hypercoagulation workup. Patient continue to do well on RA, she denies any nosebleeds in the past 24 hrs. Patient also denies any shortness of breath. Patient appears euvolic on exam. --Transition to Eliquis from heparin gtt -- Will hold lasix  #Obstructive shock, resolved  Elevated troponin- likely 2/2 demand. Tachycardia has improved, but BP is still slightly elevated. --Continue to monitor on tele -- Increase Coreg to 25 mg bid (home dose)  #Leukocytosis WBC 16.5>>15.9. No signs of infection, vitals within normal limits. UA and CXR within normal limits. --Will continue to monitor.  #AKI - resolved. Creatinine 0.78>>0.83. Slight increase likely secondary to lasix.   #GERD -stable     DVT prophylaxsis  heparin gtt  Code Status:  Full code    Family Communication: Discussed in detail with the patient, all imaging results, lab results explained to the patient   Disposition Plan:   2-3 days    Consultants:   PCCM  Procedures:   none   Antibiotics: Anti-infectives (From admission, onward)   None         HPI/Subjective:  Patient doing well this morning. She denies SOB and nose bleeds. Still endorses fatigue, but it has improved from yesterday.  Objective: Vitals:   12/13/17 1804 12/13/17 2130 12/14/17 0546 12/14/17 0622  BP:  132/79 (!) 141/84   Pulse:  86 93   Resp:   18   Temp:  99.1 F (37.3 C) 98.5 F (36.9 C)   TempSrc:  Oral Oral   SpO2:  99% 96%   Weight: 249 lb 12.5 oz (113.3 kg)   242 lb 8.1 oz (110 kg)  Height:        Intake/Output Summary (Last 24 hours) at 12/14/2017 0737 Last data filed at 12/14/2017 0600 Gross per 24 hour  Intake 283.46 ml  Output 1400 ml  Net -1116.54 ml    Exam:  Examination:  General exam: Appears calm and comfortable  Respiratory system: Clear to auscultation. Respiratory effort increased Cardiovascular system: S1 & S2 heard, RRR. No JVD, murmurs, rubs, gallops or clicks. No pedal edema. Gastrointestinal system: Abdomen is nondistended, soft and nontender. No organomegaly or masses felt. Normal bowel sounds heard. Central nervous system: Alert and oriented. No focal neurological  deficits. Extremities: Symmetric 5 x 5 power. Skin: No rashes, lesions or ulcers Psychiatry: Judgement and insight appear normal. Mood & affect appropriate.     Data Reviewed: I have personally reviewed following labs and imaging studies  Micro Results Recent Results (from the past 240 hour(s))  Urine culture     Status: Abnormal   Collection Time: 12/09/17  6:09 PM  Result Value Ref Range Status   Specimen Description   Final    URINE, CLEAN CATCH Performed at [facility] [address], [place], [place] [address]     Special Requests   Final    NONE Performed at [facility], [place] [address], [place], [place] [address]    Culture >=100,000 COLONIES/mL ESCHERICHIA COLI (A)  Final   Report Status 12/12/2017 FINAL  Final   Organism ID, Bacteria ESCHERICHIA COLI (A)  Final      Susceptibility   Escherichia coli - MIC*    AMPICILLIN 16 INTERMEDIATE Intermediate     CEFAZOLIN <=4 SENSITIVE Sensitive     CEFTRIAXONE <=1 SENSITIVE Sensitive     CIPROFLOXACIN <=0.25 SENSITIVE Sensitive     GENTAMICIN <=1 SENSITIVE Sensitive     IMIPENEM <=0.25 SENSITIVE Sensitive     NITROFURANTOIN <=16 SENSITIVE Sensitive     TRIMETH/SULFA <=20 SENSITIVE Sensitive     AMPICILLIN/SULBACTAM 4 SENSITIVE Sensitive     PIP/TAZO <=4 SENSITIVE Sensitive     Extended ESBL NEGATIVE Sensitive     * >=100,000 COLONIES/mL ESCHERICHIA COLI  Blood culture (routine x 2)     Status: None (Preliminary result)   Collection Time: 12/09/17  6:14 PM  Result Value Ref Range Status   Specimen Description   Final    RIGHT ANTECUBITAL Performed at [facility] [address], [place], [place] [address]    Special Requests   Final    BOTTLES DRAWN AEROBIC AND ANAEROBIC Blood Culture adequate volume Performed at [facility] [address] [place], [place] [address]    Culture   Final    NO GROWTH 3 DAYS Performed at [facility], [place] [address], [place], [place] [address]    Report Status PENDING  Incomplete  MRSA PCR Screening     Status: None   Collection Time: 12/10/17  7:55 AM  Result Value Ref Range Status   MRSA by PCR NEGATIVE NEGATIVE Final    Comment:        The GeneXpert MRSA Assay (FDA approved for NASAL specimens only), is one component of a comprehensive MRSA colonization surveillance program. It is not intended to diagnose MRSA infection nor to guide or monitor treatment for MRSA infections. Performed at [facility], [place] [address],  [place], [place] [address]     Radiology Reports Dg Chest 2 View  Result Date: 12/09/2017 CLINICAL DATA:  Hypoxia and cough EXAM: CHEST  2 VIEW COMPARISON:  Chest radiograph 01/18/2005 FINDINGS: The heart size and mediastinal contours are within normal limits. Both lungs are clear. The visualized skeletal structures are unremarkable. IMPRESSION: No active cardiopulmonary disease. Electronically Signed   By: Ulyses Jarred M.D.   On: 12/09/2017 19:13   Ct Angio Chest Pe W And/or Wo Contrast  Addendum Date: 12/10/2017   ADDENDUM REPORT: 12/10/2017 01:11 ADDENDUM: Positive for acute PE with CT evidence of right heart strain (RV/LV Ratio = 1.74) consistent with at least submassive (intermediate risk) PE. The presence of right heart strain has been associated with an  increased risk of morbidity and mortality. Please activate Code PE by paging [phone]. Electronically Signed   By: Anner Crete M.D.   On: 12/10/2017 01:11   Addendum Date: 12/10/2017   ADDENDUM REPORT: 12/10/2017 01:05 ADDENDUM: Positive for acute PE with CTevidence of right heart strain (RV/LV Ratio = 1.74) consistent with at least submassive (intermediate risk) PE. The presence of right heart strain has been associated with an increased risk of morbidity and mortality. Electronically Signed   By: Anner Crete M.D.   On: 12/10/2017 01:05   Result Date: 12/10/2017 CLINICAL DATA:  62 year old female with shortness of breath. Generalized weakness and fatigue. EXAM: CT ANGIOGRAPHY CHEST WITH CONTRAST TECHNIQUE: Multidetector CT imaging of the chest was performed using the standard protocol during bolus administration of intravenous contrast. Multiplanar CT image reconstructions and MIPs were obtained to evaluate the vascular anatomy. CONTRAST:  100 cc Isovue 370 COMPARISON:  Chest radiograph dated 12/09/2017 FINDINGS: Cardiovascular: There is no cardiomegaly or pericardial effusion. There is dilatation of the right ventricle with bowing of  the intraventricular septum to the left. The right ventricle measures 5.4 cm in diameter and the left ventricle measures 3.1 cm in diameter. Findings consistent with right ventricular strain. There is moderate calcified and noncalcified plaque of the thoracic aorta. There is a large saddle pulmonary radiology embolus straddling the bifurcation of the main pulmonary trunk and extending into the bilateral pulmonary arteries. There is occlusion of the lobar and segmental arteries of the lower lobes. There is dilatation of the main pulmonary trunk and central pulmonary arteries. Mediastinum/Nodes: There is no hilar or mediastinal adenopathy. Esophagus and the thyroid gland are grossly unremarkable. No mediastinal fluid collection. Lungs/Pleura: There is a 3 cm subpleural bleb in the left lung base. The lungs are otherwise clear. There is no pleural effusion or pneumothorax. The central airways are patent. Upper Abdomen: There is filling defect within the visualized intrahepatic IVC (series 5, image 245). This area is only partially visualized and not well evaluated. Although this may be related to mixing artifact findings is concerning for IVC thrombus. Further evaluation with CT of the abdomen pelvis with IV contrast and delayed images in the venous phase recommended. Cholecystectomy. Musculoskeletal: Degenerative changes of the spine. No acute osseous pathology. Review of the MIP images confirms the above findings. IMPRESSION: 1. Large occlusive saddle embolus with findings of right heart straining. 2. Partially visualized filling defect in the intrahepatic IVC concerning for thrombus. Further evaluation with CT of the abdomen and pelvis with IV contrast in venous phase recommended. These results were called by telephone at the time of interpretation on 12/10/2017 at 12:46 am to Dr. Marda Stalker , who verbally acknowledged these results. Electronically Signed: By: Anner Crete M.D. On: 12/10/2017 00:54    Ir Angiogram Pulmonary Bilateral Selective  Result Date: 12/10/2017 INDICATION: 62 year old female presents with submassive pulmonary embolism EXAM: ULTRASOUND GUIDED ACCESS RIGHT COMMON FEMORAL VEIN TIMES TO ACCESS PULMONARY ANGIOGRAM PLACEMENT OF ULTRASOUND ACCELERATED LYSIS INFUSION CATHETERS FOR PE LYSIS COMPARISON:  CT 12/10/2017 MEDICATIONS: None. ANESTHESIA/SEDATION: Versed 2.0 mg IV; Fentanyl 50 mcg IV Moderate Sedation Time:  44 minutes The patient was continuously monitored during the procedure by the interventional radiology nurse under my direct supervision. FLUOROSCOPY TIME:  Fluoroscopy Time: 9 minutes 36 seconds (73 mGy). COMPLICATIONS: None TECHNIQUE: Informed consent was obtained from the patient and the patient's family following explanation of the procedure, risks, benefits and alternatives. Specific risks include bleeding, infection, contrast reaction, kidney injury, venous injury, life-threatening hemorrhage  including brain hemorrhage, gastrointestinal hemorrhage, epistaxis, need for further surgery, need for further procedure, cardiopulmonary collapse, death. The patient understands, agrees and consents for the procedure. All questions were addressed. Patient is position supine position on the fluoroscopy table. Maximal barrier sterile technique utilized including caps, mask, sterile gowns, sterile gloves, large sterile drape, hand hygiene, and betadine prep. 1% lidocaine used for local anesthesia. Moderate sedation was provided. Ultrasound survey of the right inguinal region was performed with images stored and sent to PACs. A single wall needle was used access the right common femoral vein under ultrasound. With venous blood flow returned, an 035 wire was passed through the needle into the IVC. A 6 French sheath was placed over the wire. The dilator was removed and the sheath was flushed. Subsequently, a single wall needle was used access the right common femoral vein adjacent to the  initial puncture. With venous blood flow returned, a 6 [place] vascular sheath was placed over the wire. The dilator was removed and the sheath was flushed. Combination of a 5 French pigtail catheter, vertebral catheter and a Bentson wire was then used to select the main pulmonary artery. Bentson wire, Glidewire, and vertebral catheter was then used to navigate into the right-sided pulmonary veins, with selection of lower lobe pulmonary vein. Glidewire was used to advance into a more distal position and the vertebral catheter was placed into medial segments. Vertebral catheter was removed and a 18 cm infusion length EKOS ultrasound accelerated catheter was placed over the Glidewire. Small contrast infusion confirmed position. Catheter was flushed. Combination of a 5 French pigtail catheter, vertebral catheter and a Bentson wire was then used to select the main pulmonary artery through the second sheath. Wire was removed and a main pulmonary artery pressure transduction was recorded. Once the catheter was positioned into the lower lobar branches, a 12 cm infusion length EKOS catheter was placed over a Rosen wire. Wire was removed, small amount of contrast confirmed position and catheter was flushed. The electronic wires were then placed through both the left and right catheters. Sheaths were secured in position.  Final image was stored. The patient tolerated the procedure well and remained hemodynamically stable throughout. No complications were encountered and no significant blood loss was encountered. FINDINGS: Ultrasound survey demonstrates patent right common femoral vein. Main pulmonary artery pressure measures 47/9 (26). Final image demonstrates right-sided catheter, 18 cm infusion length, directed into lower lobar branches and left sided catheter, 12 cm infusion length, directed into lower lobar branches. The cranial 6 French (green) sheaths transmits the right-sided catheter. The caudal 6 French (green) sheath  transmits the left-sided pulmonary artery catheter. IMPRESSION: Status post placement of left and right pulmonary artery EKOS ultrasound accelerated catheters for initiation of catheter directed thrombolysis. Signed, Dulcy Fanny. Earleen Newport DO [facility] Radiology PLAN: Patient will be ICU status throughout. Bed rest. Both the right and left catheter will receive 1 mg tPA per hour for 12 hours, for a total dose of 24 mg over 12 hours. Plan for repeat trans duction of catheter pressures after treatment, reassessment, and probable removal of catheters. EVery 6 our blood draw, including CBC, heparin level, and fibrinogen. Electronically Signed   By: Corrie Mckusick D.O.   On: 12/10/2017 10:14   Ir Angiogram Selective Each Additional Vessel  Result Date: 12/10/2017 INDICATION: 62 year old female presents with submassive pulmonary embolism EXAM: ULTRASOUND GUIDED ACCESS RIGHT COMMON FEMORAL VEIN TIMES TO ACCESS PULMONARY ANGIOGRAM PLACEMENT OF ULTRASOUND ACCELERATED LYSIS INFUSION CATHETERS  FOR PE LYSIS COMPARISON:  CT 12/10/2017 MEDICATIONS: None. ANESTHESIA/SEDATION: Versed 2.0 mg IV; Fentanyl 50 mcg IV Moderate Sedation Time:  44 minutes The patient was continuously monitored during the procedure by the interventional radiology nurse under my direct supervision. FLUOROSCOPY TIME:  Fluoroscopy Time: 9 minutes 36 seconds (73 mGy). COMPLICATIONS: None TECHNIQUE: Informed consent was obtained from the patient and the patient's family following explanation of the procedure, risks, benefits and alternatives. Specific risks include bleeding, infection, contrast reaction, kidney injury, venous injury, life-threatening hemorrhage including brain hemorrhage, gastrointestinal hemorrhage, epistaxis, need for further surgery, need for further procedure, cardiopulmonary collapse, death. The patient understands, agrees and consents for the procedure. All questions were addressed. Patient  is position supine position on the fluoroscopy table. Maximal barrier sterile technique utilized including caps, mask, sterile gowns, sterile gloves, large sterile drape, hand hygiene, and betadine prep. 1% lidocaine used for local anesthesia. Moderate sedation was provided. Ultrasound survey of the right inguinal region was performed with images stored and sent to PACs. A single wall needle was used access the right common femoral vein under ultrasound. With venous blood flow returned, an 035 wire was passed through the needle into the IVC. A 6 French sheath was placed over the wire. The dilator was removed and the sheath was flushed. Subsequently, a single wall needle was used access the right common femoral vein adjacent to the initial puncture. With venous blood flow returned, a 6 [place] vascular sheath was placed over the wire. The dilator was removed and the sheath was flushed. Combination of a 5 French pigtail catheter, vertebral catheter and a Bentson wire was then used to select the main pulmonary artery. Bentson wire, Glidewire, and vertebral catheter was then used to navigate into the right-sided pulmonary veins, with selection of lower lobe pulmonary vein. Glidewire was used to advance into a more distal position and the vertebral catheter was placed into medial segments. Vertebral catheter was removed and a 18 cm infusion length EKOS ultrasound accelerated catheter was placed over the Glidewire. Small contrast infusion confirmed position. Catheter was flushed. Combination of a 5 French pigtail catheter, vertebral catheter and a Bentson wire was then used to select the main pulmonary artery through the second sheath. Wire was removed and a main pulmonary artery pressure transduction was recorded. Once the catheter was positioned into the lower lobar branches, a 12 cm infusion length EKOS catheter was placed over a Rosen wire. Wire was removed, small amount of contrast confirmed position and catheter was  flushed. The electronic wires were then placed through both the left and right catheters. Sheaths were secured in position.  Final image was stored. The patient tolerated the procedure well and remained hemodynamically stable throughout. No complications were encountered and no significant blood loss was encountered. FINDINGS: Ultrasound survey demonstrates patent right common femoral vein. Main pulmonary artery pressure measures 47/9 (26). Final image demonstrates right-sided catheter, 18 cm infusion length, directed into lower lobar branches and left sided catheter, 12 cm infusion length, directed into lower lobar branches. The cranial 6 French (green) sheaths transmits the right-sided catheter. The caudal 6 French (green) sheath transmits the left-sided pulmonary artery catheter. IMPRESSION: Status post placement of left and right pulmonary artery EKOS ultrasound accelerated catheters for initiation of catheter directed thrombolysis. Signed, Dulcy Fanny. Earleen Newport DO [facility] Radiology PLAN: Patient will be ICU status throughout. Bed rest. Both the right and left catheter will receive 1 mg tPA per hour for 12 hours, for a total  dose of 24 mg over 12 hours. Plan for repeat trans duction of catheter pressures after treatment, reassessment, and probable removal of catheters. EVery 6 our blood draw, including CBC, heparin level, and fibrinogen. Electronically Signed   By: Corrie Mckusick D.O.   On: 12/10/2017 10:14   Ir Angiogram Selective Each Additional Vessel  Result Date: 12/10/2017 INDICATION: 62 year old female presents with submassive pulmonary embolism EXAM: ULTRASOUND GUIDED ACCESS RIGHT COMMON FEMORAL VEIN TIMES TO ACCESS PULMONARY ANGIOGRAM PLACEMENT OF ULTRASOUND ACCELERATED LYSIS INFUSION CATHETERS FOR PE LYSIS COMPARISON:  CT 12/10/2017 MEDICATIONS: None. ANESTHESIA/SEDATION: Versed 2.0 mg IV; Fentanyl 50 mcg IV Moderate Sedation Time:  44 minutes The  patient was continuously monitored during the procedure by the interventional radiology nurse under my direct supervision. FLUOROSCOPY TIME:  Fluoroscopy Time: 9 minutes 36 seconds (73 mGy). COMPLICATIONS: None TECHNIQUE: Informed consent was obtained from the patient and the patient's family following explanation of the procedure, risks, benefits and alternatives. Specific risks include bleeding, infection, contrast reaction, kidney injury, venous injury, life-threatening hemorrhage including brain hemorrhage, gastrointestinal hemorrhage, epistaxis, need for further surgery, need for further procedure, cardiopulmonary collapse, death. The patient understands, agrees and consents for the procedure. All questions were addressed. Patient is position supine position on the fluoroscopy table. Maximal barrier sterile technique utilized including caps, mask, sterile gowns, sterile gloves, large sterile drape, hand hygiene, and betadine prep. 1% lidocaine used for local anesthesia. Moderate sedation was provided. Ultrasound survey of the right inguinal region was performed with images stored and sent to PACs. A single wall needle was used access the right common femoral vein under ultrasound. With venous blood flow returned, an 035 wire was passed through the needle into the IVC. A 6 French sheath was placed over the wire. The dilator was removed and the sheath was flushed. Subsequently, a single wall needle was used access the right common femoral vein adjacent to the initial puncture. With venous blood flow returned, a 6 [place] vascular sheath was placed over the wire. The dilator was removed and the sheath was flushed. Combination of a 5 French pigtail catheter, vertebral catheter and a Bentson wire was then used to select the main pulmonary artery. Bentson wire, Glidewire, and vertebral catheter was then used to navigate into the right-sided pulmonary veins, with selection of lower lobe pulmonary vein. Glidewire was  used to advance into a more distal position and the vertebral catheter was placed into medial segments. Vertebral catheter was removed and a 18 cm infusion length EKOS ultrasound accelerated catheter was placed over the Glidewire. Small contrast infusion confirmed position. Catheter was flushed. Combination of a 5 French pigtail catheter, vertebral catheter and a Bentson wire was then used to select the main pulmonary artery through the second sheath. Wire was removed and a main pulmonary artery pressure transduction was recorded. Once the catheter was positioned into the lower lobar branches, a 12 cm infusion length EKOS catheter was placed over a Rosen wire. Wire was removed, small amount of contrast confirmed position and catheter was flushed. The electronic wires were then placed through both the left and right catheters. Sheaths were secured in position.  Final image was stored. The patient tolerated the procedure well and remained hemodynamically stable throughout. No complications were encountered and no significant blood loss was encountered. FINDINGS: Ultrasound survey demonstrates patent right common femoral vein. Main pulmonary artery pressure measures 47/9 (26). Final image demonstrates right-sided catheter, 18 cm infusion length, directed into lower lobar branches and left sided catheter, 12 cm infusion  length, directed into lower lobar branches. The cranial 6 French (green) sheaths transmits the right-sided catheter. The caudal 6 French (green) sheath transmits the left-sided pulmonary artery catheter. IMPRESSION: Status post placement of left and right pulmonary artery EKOS ultrasound accelerated catheters for initiation of catheter directed thrombolysis. Signed, Dulcy Fanny. Earleen Newport DO [facility] Radiology PLAN: Patient will be ICU status throughout. Bed rest. Both the right and left catheter will receive 1 mg tPA per hour for 12 hours, for a total dose  of 24 mg over 12 hours. Plan for repeat trans duction of catheter pressures after treatment, reassessment, and probable removal of catheters. EVery 6 our blood draw, including CBC, heparin level, and fibrinogen. Electronically Signed   By: Corrie Mckusick D.O.   On: 12/10/2017 10:14   Ir US Guide Vasc Access Right  Result Date: 12/10/2017 INDICATION: 62 year old female presents with submassive pulmonary embolism EXAM: ULTRASOUND GUIDED ACCESS RIGHT COMMON FEMORAL VEIN TIMES TO ACCESS PULMONARY ANGIOGRAM PLACEMENT OF ULTRASOUND ACCELERATED LYSIS INFUSION CATHETERS FOR PE LYSIS COMPARISON:  CT 12/10/2017 MEDICATIONS: None. ANESTHESIA/SEDATION: Versed 2.0 mg IV; Fentanyl 50 mcg IV Moderate Sedation Time:  44 minutes The patient was continuously monitored during the procedure by the interventional radiology nurse under my direct supervision. FLUOROSCOPY TIME:  Fluoroscopy Time: 9 minutes 36 seconds (73 mGy). COMPLICATIONS: None TECHNIQUE: Informed consent was obtained from the patient and the patient's family following explanation of the procedure, risks, benefits and alternatives. Specific risks include bleeding, infection, contrast reaction, kidney injury, venous injury, life-threatening hemorrhage including brain hemorrhage, gastrointestinal hemorrhage, epistaxis, need for further surgery, need for further procedure, cardiopulmonary collapse, death. The patient understands, agrees and consents for the procedure. All questions were addressed. Patient is position supine position on the fluoroscopy table. Maximal barrier sterile technique utilized including caps, mask, sterile gowns, sterile gloves, large sterile drape, hand hygiene, and betadine prep. 1% lidocaine used for local anesthesia. Moderate sedation was provided. Ultrasound survey of the right inguinal region was performed with images stored and sent to PACs. A single wall needle was used access the right common femoral vein under ultrasound. With venous  blood flow returned, an 035 wire was passed through the needle into the IVC. A 6 French sheath was placed over the wire. The dilator was removed and the sheath was flushed. Subsequently, a single wall needle was used access the right common femoral vein adjacent to the initial puncture. With venous blood flow returned, a 6 [place] vascular sheath was placed over the wire. The dilator was removed and the sheath was flushed. Combination of a 5 French pigtail catheter, vertebral catheter and a Bentson wire was then used to select the main pulmonary artery. Bentson wire, Glidewire, and vertebral catheter was then used to navigate into the right-sided pulmonary veins, with selection of lower lobe pulmonary vein. Glidewire was used to advance into a more distal position and the vertebral catheter was placed into medial segments. Vertebral catheter was removed and a 18 cm infusion length EKOS ultrasound accelerated catheter was placed over the Glidewire. Small contrast infusion confirmed position. Catheter was flushed. Combination of a 5 French pigtail catheter, vertebral catheter and a Bentson wire was then used to select the main pulmonary artery through the second sheath. Wire was removed and a main pulmonary artery pressure transduction was recorded. Once the catheter was positioned into the lower lobar branches, a 12 cm infusion length EKOS catheter was placed over a Rosen wire. Wire was removed, small amount of contrast  confirmed position and catheter was flushed. The electronic wires were then placed through both the left and right catheters. Sheaths were secured in position.  Final image was stored. The patient tolerated the procedure well and remained hemodynamically stable throughout. No complications were encountered and no significant blood loss was encountered. FINDINGS: Ultrasound survey demonstrates patent right common femoral vein. Main pulmonary artery pressure measures 47/9 (26). Final image demonstrates  right-sided catheter, 18 cm infusion length, directed into lower lobar branches and left sided catheter, 12 cm infusion length, directed into lower lobar branches. The cranial 6 French (green) sheaths transmits the right-sided catheter. The caudal 6 French (green) sheath transmits the left-sided pulmonary artery catheter. IMPRESSION: Status post placement of left and right pulmonary artery EKOS ultrasound accelerated catheters for initiation of catheter directed thrombolysis. Signed, Dulcy Fanny. Earleen Newport DO [facility] Radiology PLAN: Patient will be ICU status throughout. Bed rest. Both the right and left catheter will receive 1 mg tPA per hour for 12 hours, for a total dose of 24 mg over 12 hours. Plan for repeat trans duction of catheter pressures after treatment, reassessment, and probable removal of catheters. EVery 6 our blood draw, including CBC, heparin level, and fibrinogen. Electronically Signed   By: Corrie Mckusick D.O.   On: 12/10/2017 10:14   Dg Chest Port 1 View  Result Date: 12/12/2017 CLINICAL DATA:  Respiratory distress. EXAM: PORTABLE CHEST 1 VIEW COMPARISON:  Chest radiographs 12/09/2017 and CTA 12/10/2017 FINDINGS: The cardiac silhouette is mildly enlarged. The lungs are hypoinflated with new central pulmonary vascular congestion. There is no overt edema. No sizable pleural effusion or pneumothorax is identified. Thoracic spondylosis is noted. IMPRESSION: Hypoinflation with pulmonary vascular congestion. Electronically Signed   By: Logan Bores M.D.   On: 12/12/2017 10:26   Ct Venogram Abd/pel  Result Date: 12/10/2017 CLINICAL DATA:  Submassive pulmonary embolism. 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 EXAM: CT VENOGRAM ABD-PELVIS TECHNIQUE: Multidetector CT imaging of the abdomen and pelvis was performed using the standard protocol during bolus administration of intravenous contrast. Multiplanar reconstructed images and MIPs were obtained and reviewed to  evaluate the vascular anatomy. CONTRAST:  160mL ISOVUE-300 IOPAMIDOL (ISOVUE-300) INJECTION 61% COMPARISON:  01/05/2005 FINDINGS: VASCULAR Veins: Delayed phase imaging demonstrates no evidence of DVT. Specifically there is no evidence of thrombus within the IVC. Some mixing artifact is noted. The abnormality on the accompanying chest CT represents artifact. Arterial: Portal venous phase imaging demonstrates atherosclerotic changes of the abdominal aorta. Maximal diameter of the abdominal aorta is 3.1 cm. 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 There is ectasia of the right internal iliac artery. Review of the MIP images confirms the above findings. NON-VASCULAR Lower chest: Mild dependent atelectasis. Hepatobiliary: Postcholecystectomy. Tiny cyst in the right lobe inferiorly on image 33 of series 3. Pancreas: Unremarkable Spleen: Unremarkable Adrenals/Urinary Tract: Kidneys are within normal limits. There is contrast in the collecting system from the previous scan. Tiny cysts are present in the kidneys. Adrenal glands are within normal limits. Bladder is decompressed by Foley catheter. Stomach/Bowel: Stomach and duodenum are within normal limits. There is no evidence of small-bowel obstruction. Normal appendix. No obvious mass in the colon. Lymphatic: No abnormal retroperitoneal adenopathy. Reproductive: Uterus and adnexa are within normal limits. Other: Tiny sebaceous cyst in the ischial rectal fossa on image 92 of series 3. No free-fluid. Musculoskeletal: No vertebral compression deformity. Advanced multilevel degenerative disc disease in the  lumbar spine. IMPRESSION: VASCULAR There is no evidence of DVT on this study. Specifically, there is no evidence of thrombus in the IVC. The finding and noted on the accompanying chest CT represents artifact. Abdominal aortic aneurysm maximal diameter is 3.1 cm. Recommend  followup by ultrasound in 3 years. This recommendation follows ACR consensus guidelines: White Paper of the ACR Incidental Findings Committee II on Vascular Findings. 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 NON-VASCULAR Chronic changes. Electronically Signed   By: Marybelle Killings M.D.   On: 12/10/2017 06:38   Ir Infusion Thrombol Arterial Initial (ms)  Result Date: 12/10/2017 INDICATION: 62 year old female presents with submassive pulmonary embolism EXAM: ULTRASOUND GUIDED ACCESS RIGHT COMMON FEMORAL VEIN TIMES TO ACCESS PULMONARY ANGIOGRAM PLACEMENT OF ULTRASOUND ACCELERATED LYSIS INFUSION CATHETERS FOR PE LYSIS COMPARISON:  CT 12/10/2017 MEDICATIONS: None. ANESTHESIA/SEDATION: Versed 2.0 mg IV; Fentanyl 50 mcg IV Moderate Sedation Time:  44 minutes The patient was continuously monitored during the procedure by the interventional radiology nurse under my direct supervision. FLUOROSCOPY TIME:  Fluoroscopy Time: 9 minutes 36 seconds (73 mGy). COMPLICATIONS: None TECHNIQUE: Informed consent was obtained from the patient and the patient's family following explanation of the procedure, risks, benefits and alternatives. Specific risks include bleeding, infection, contrast reaction, kidney injury, venous injury, life-threatening hemorrhage including brain hemorrhage, gastrointestinal hemorrhage, epistaxis, need for further surgery, need for further procedure, cardiopulmonary collapse, death. The patient understands, agrees and consents for the procedure. All questions were addressed. Patient is position supine position on the fluoroscopy table. Maximal barrier sterile technique utilized including caps, mask, sterile gowns, sterile gloves, large sterile drape, hand hygiene, and betadine prep. 1% lidocaine used for local anesthesia. Moderate sedation was provided. Ultrasound survey of the right inguinal region was performed with images stored and sent to PACs. A single wall  needle was used access the right common femoral vein under ultrasound. With venous blood flow returned, an 035 wire was passed through the needle into the IVC. A 6 French sheath was placed over the wire. The dilator was removed and the sheath was flushed. Subsequently, a single wall needle was used access the right common femoral vein adjacent to the initial puncture. With venous blood flow returned, a 6 [place] vascular sheath was placed over the wire. The dilator was removed and the sheath was flushed. Combination of a 5 French pigtail catheter, vertebral catheter and a Bentson wire was then used to select the main pulmonary artery. Bentson wire, Glidewire, and vertebral catheter was then used to navigate into the right-sided pulmonary veins, with selection of lower lobe pulmonary vein. Glidewire was used to advance into a more distal position and the vertebral catheter was placed into medial segments. Vertebral catheter was removed and a 18 cm infusion length EKOS ultrasound accelerated catheter was placed over the Glidewire. Small contrast infusion confirmed position. Catheter was flushed. Combination of a 5 French pigtail catheter, vertebral catheter and a Bentson wire was then used to select the main pulmonary artery through the second sheath. Wire was removed and a main pulmonary artery pressure transduction was recorded. Once the catheter was positioned into the lower lobar branches, a 12 cm infusion length EKOS catheter was placed over a Rosen wire. Wire was removed, small amount of contrast confirmed position and catheter was flushed. The electronic wires were then placed through both the left and right catheters. Sheaths were secured in position.  Final image was stored. The patient tolerated the procedure well and remained hemodynamically  stable throughout. No complications were encountered and no significant blood loss was encountered. FINDINGS: Ultrasound survey demonstrates patent right common femoral  vein. Main pulmonary artery pressure measures 47/9 (26). Final image demonstrates right-sided catheter, 18 cm infusion length, directed into lower lobar branches and left sided catheter, 12 cm infusion length, directed into lower lobar branches. The cranial 6 French (green) sheaths transmits the right-sided catheter. The caudal 6 French (green) sheath transmits the left-sided pulmonary artery catheter. IMPRESSION: Status post placement of left and right pulmonary artery EKOS ultrasound accelerated catheters for initiation of catheter directed thrombolysis. Signed, Dulcy Fanny. Earleen Newport DO [facility] Radiology PLAN: Patient will be ICU status throughout. Bed rest. Both the right and left catheter will receive 1 mg tPA per hour for 12 hours, for a total dose of 24 mg over 12 hours. Plan for repeat trans duction of catheter pressures after treatment, reassessment, and probable removal of catheters. EVery 6 our blood draw, including CBC, heparin level, and fibrinogen. Electronically Signed   By: Corrie Mckusick D.O.   On: 12/10/2017 10:14   Ir Infusion Thrombol Arterial Initial (ms)  Result Date: 12/10/2017 INDICATION: 62 year old female presents with submassive pulmonary embolism EXAM: ULTRASOUND GUIDED ACCESS RIGHT COMMON FEMORAL VEIN TIMES TO ACCESS PULMONARY ANGIOGRAM PLACEMENT OF ULTRASOUND ACCELERATED LYSIS INFUSION CATHETERS FOR PE LYSIS COMPARISON:  CT 12/10/2017 MEDICATIONS: None. ANESTHESIA/SEDATION: Versed 2.0 mg IV; Fentanyl 50 mcg IV Moderate Sedation Time:  44 minutes The patient was continuously monitored during the procedure by the interventional radiology nurse under my direct supervision. FLUOROSCOPY TIME:  Fluoroscopy Time: 9 minutes 36 seconds (73 mGy). COMPLICATIONS: None TECHNIQUE: Informed consent was obtained from the patient and the patient's family following explanation of the procedure, risks, benefits and alternatives. Specific risks include  bleeding, infection, contrast reaction, kidney injury, venous injury, life-threatening hemorrhage including brain hemorrhage, gastrointestinal hemorrhage, epistaxis, need for further surgery, need for further procedure, cardiopulmonary collapse, death. The patient understands, agrees and consents for the procedure. All questions were addressed. Patient is position supine position on the fluoroscopy table. Maximal barrier sterile technique utilized including caps, mask, sterile gowns, sterile gloves, large sterile drape, hand hygiene, and betadine prep. 1% lidocaine used for local anesthesia. Moderate sedation was provided. Ultrasound survey of the right inguinal region was performed with images stored and sent to PACs. A single wall needle was used access the right common femoral vein under ultrasound. With venous blood flow returned, an 035 wire was passed through the needle into the IVC. A 6 French sheath was placed over the wire. The dilator was removed and the sheath was flushed. Subsequently, a single wall needle was used access the right common femoral vein adjacent to the initial puncture. With venous blood flow returned, a 6 [place] vascular sheath was placed over the wire. The dilator was removed and the sheath was flushed. Combination of a 5 French pigtail catheter, vertebral catheter and a Bentson wire was then used to select the main pulmonary artery. Bentson wire, Glidewire, and vertebral catheter was then used to navigate into the right-sided pulmonary veins, with selection of lower lobe pulmonary vein. Glidewire was used to advance into a more distal position and the vertebral catheter was placed into medial segments. Vertebral catheter was removed and a 18 cm infusion length EKOS ultrasound accelerated catheter was placed over the Glidewire. Small contrast infusion confirmed position. Catheter was flushed. Combination of a 5 French pigtail catheter, vertebral catheter and a Bentson wire was then used  to  select the main pulmonary artery through the second sheath. Wire was removed and a main pulmonary artery pressure transduction was recorded. Once the catheter was positioned into the lower lobar branches, a 12 cm infusion length EKOS catheter was placed over a Rosen wire. Wire was removed, small amount of contrast confirmed position and catheter was flushed. The electronic wires were then placed through both the left and right catheters. Sheaths were secured in position.  Final image was stored. The patient tolerated the procedure well and remained hemodynamically stable throughout. No complications were encountered and no significant blood loss was encountered. FINDINGS: Ultrasound survey demonstrates patent right common femoral vein. Main pulmonary artery pressure measures 47/9 (26). Final image demonstrates right-sided catheter, 18 cm infusion length, directed into lower lobar branches and left sided catheter, 12 cm infusion length, directed into lower lobar branches. The cranial 6 French (green) sheaths transmits the right-sided catheter. The caudal 6 French (green) sheath transmits the left-sided pulmonary artery catheter. IMPRESSION: Status post placement of left and right pulmonary artery EKOS ultrasound accelerated catheters for initiation of catheter directed thrombolysis. Signed, Dulcy Fanny. Earleen Newport DO [facility] Radiology PLAN: Patient will be ICU status throughout. Bed rest. Both the right and left catheter will receive 1 mg tPA per hour for 12 hours, for a total dose of 24 mg over 12 hours. Plan for repeat trans duction of catheter pressures after treatment, reassessment, and probable removal of catheters. EVery 6 our blood draw, including CBC, heparin level, and fibrinogen. Electronically Signed   By: Corrie Mckusick D.O.   On: 12/10/2017 10:14   Ir Jacolyn Reedy F/u Eval Art/ven Final Day (ms)  Result Date: 12/11/2017 INDICATION: Status post completion of  12 hours of bilateral catheter directed ultrasound assisted thrombolytic therapy to treat bilateral submassive pulmonary embolism. Thrombolytic therapy was complicated by a significant nosebleed and some associated drop in hemoglobin. Epistaxis has ceased after completion of thrombolytic therapy. EXAM: FOLLOW-UP OF PULMONARY ARTERIAL THROMBOLYTIC THERAPY, FINAL DAY MEDICATIONS: None ANESTHESIA/SEDATION: None CONTRAST:  None FLUOROSCOPY TIME:  None COMPLICATIONS: None immediate. PROCEDURE: Catheters were retracted into the main pulmonary artery. Repeat pulmonary artery pressures were obtained. FINDINGS: Repeat pulmonary artery pressure is 38/29 (32) mm Hg. Prior to infusion, documented pulmonary artery pressure was 47/9 (26) mm Hg. IMPRESSION: There is some decrease in systolic pulmonary arterial pressure after bilateral ultrasound assisted thrombolytic therapy. Mean pressure did not decrease due to higher measured diastolic pressure. Due to significant epistaxis overnight, further thrombolytic therapy will not be performed. Electronically Signed   By: Aletta Edouard M.D.   On: 12/11/2017 15:46     CBC Recent Labs  Lab 12/09/17 1837  12/10/17 2131 12/11/17 0337 12/12/17 0313 12/13/17 1130 12/14/17 0429  WBC 13.0*   < > 11.7* 11.6* 16.5* 17.2* 15.9*  HGB 11.0*   < > 9.3* 8.3* 9.4* 8.5* 8.2*  HCT 33.6*   < > 29.2* 25.9* 29.5* 25.7* 24.7*  PLT 199   < > 161 134* 167 203 254  MCV 84.6   < > 84.6 84.1 85.5 83.4 82.9  MCH 27.7   < > 27.0 26.9 27.2 27.6 27.5  MCHC 32.7   < > 31.8 32.0 31.9 33.1 33.2  RDW 14.7   < > 14.9 14.9 15.4 15.5 15.1  LYMPHSABS 2.0  --   --   --   --   --   --   MONOABS 1.0  --   --   --   --   --   --  EOSABS 0.0  --   --   --   --   --   --   BASOSABS 0.0  --   --   --   --   --   --    < > = values in this interval not displayed.    Chemistries  Recent Labs  Lab 12/09/17 1837 12/11/17 0423 12/12/17 0313 12/14/17 0429  NA 141 144 140 139  K 4.1 2.9* 4.9 3.5  CL  110 120* 110 107  CO2 22 19* 18* 22  GLUCOSE 130* 82 64* 84  BUN 22* 9 6 10   CREATININE 1.24* 0.64 0.78 0.83  CALCIUM 8.3* 6.5* 8.5* 8.3*  MG 2.0 1.4* 2.1  --   AST 15  --   --  17  ALT 16  --   --  17  ALKPHOS 71  --   --  60  BILITOT 0.8  --   --  0.9   ------------------------------------------------------------------------------------------------------------------ estimated creatinine clearance is 80.1 mL/min (by C-G formula based on SCr of 0.83 mg/dL). ------------------------------------------------------------------------------------------------------------------ No results for input(s): HGBA1C in the last 72 hours. ------------------------------------------------------------------------------------------------------------------ No results for input(s): CHOL, HDL, LDLCALC, TRIG, CHOLHDL, LDLDIRECT in the last 72 hours. ------------------------------------------------------------------------------------------------------------------ No results for input(s): TSH, T4TOTAL, T3FREE, THYROIDAB in the last 72 hours.  Invalid input(s): FREET3 ------------------------------------------------------------------------------------------------------------------ No results for input(s): VITAMINB12, FOLATE, FERRITIN, TIBC, IRON, RETICCTPCT in the last 72 hours.  Coagulation profile Recent Labs  Lab 12/09/17 1837  INR 1.18    No results for input(s): DDIMER in the last 72 hours.  Cardiac Enzymes No results for input(s): CKMB, TROPONINI, MYOGLOBIN in the last 168 hours.  Invalid input(s): CK ------------------------------------------------------------------------------------------------------------------ Invalid input(s): POCBNP   CBG: Recent Labs  Lab 12/10/17 0717 12/11/17 1235 12/12/17 0828 12/12/17 1509 12/13/17 1109  GLUCAP 73 109* 145* 135* 90       Studies: No results found.    No results found for: HGBA1C Lab Results  Component Value Date   CREATININE 0.83  12/14/2017       Scheduled Meds: . carvedilol  12.5 mg Oral BID WC   Continuous Infusions: . sodium chloride Stopped (12/11/17 1200)  . sodium chloride Stopped (12/11/17 1200)  . heparin 1,150 Units/hr (12/14/17 0537)     LOS: 4 days    Time spent: >30 MINS    Simcha Speir [facility] Pager [phone]. If 7PM-7AM, please contact night-coverage at [email or web address], password [facility] 12/14/2017, 7:37 AM  LOS: 4 days

## 2017-12-14 NOTE — Progress Notes (Signed)
Physical Therapy Treatment Patient Details Name: Kristi Meyers MRN: [id number] DOB: April 17, 1956 Today's Date: 12/14/2017    History of Present Illness 53yoF with history of HTN, HLD, and GERD, presents to the ER tonight c/o SOB, Dizzyness, and Generalized weakness x 2 days. She also c/o constipation. She denies Cough, CP, Fever, or LE edema. In the ER she was found to be in shock with BP 70/42, which has since improved to 104/77 after 2L IVF. She reports her SBP as an outpatient is typically in 170's. CTA revealed bilateral PE's in right and left main PA's as well as saddle embolus and DVT within the IVF at the hepatic level. She was started on a Heparin.  PMH: GERD, HTN and vertigo.    PT Comments    Pt admitted with above diagnosis. Pt currently with functional limitations due to the deficits listed below (see PT Problem List). Pt was able to ambulate with RW 35 feet with fatigue due to weakness bil LEs.  Progressing slowly.  Pt will benefit from skilled PT to increase their independence and safety with mobility to allow discharge to the venue listed below.     Follow Up Recommendations  SNF;Supervision/Assistance - 24 hour     Equipment Recommendations  None recommended by PT    Recommendations for Other Services       Precautions / Restrictions Precautions Precautions: Fall Restrictions Weight Bearing Restrictions: No    Mobility  Bed Mobility Overal bed mobility: Needs Assistance Bed Mobility: Supine to Sit;Sit to Supine     Supine to sit: Mod assist     General bed mobility comments: assist for LEs over EOB and to raise trunk  Transfers Overall transfer level: Needs assistance Equipment used: Rolling walker (2 wheeled) Transfers: Sit to/from Stand Sit to Stand: Mod assist;+2 safety/equipment Stand pivot transfers: Min assist;+2 safety/equipment       General transfer comment: cues for hand placement, mod assist to rise and steady.  Pt asked to get on 3N1 first  therefore transfer to 3N1 with min assist with RW.  Had to have total assist wtih cleaning.   Ambulation/Gait Ambulation/Gait assistance: Mod assist;Min assist;+2 safety/equipment Ambulation Distance (Feet): 35 Feet Assistive device: Rolling walker (2 wheeled) Gait Pattern/deviations: Step-through pattern;Decreased stride length;Trunk flexed;Wide base of support   Gait velocity interpretation: Below normal speed for age/gender General Gait Details: Pt was able to progress AMbulation with c/o of left LE pain but does report it feeling better as she walked.  States she was fatigued and had to sit after 35 feet (followed pt with chair).     Stairs            Wheelchair Mobility    Modified Rankin (Stroke Patients Only)       Balance Overall balance assessment: Needs assistance Sitting-balance support: No upper extremity supported;Feet supported Sitting balance-Leahy Scale: Fair Sitting balance - Comments: statically   Standing balance support: Bilateral upper extremity supported;During functional activity Standing balance-Leahy Scale: Poor Standing balance comment: relies on B UE support for balance                            Cognition Arousal/Alertness: Awake/alert Behavior During Therapy: Flat affect Overall Cognitive Status: Within Functional Limits for tasks assessed                                 General Comments: pt  verbalizing minimally      Exercises      General Comments        Pertinent Vitals/Pain Pain Assessment: Faces Faces Pain Scale: Hurts whole lot Pain Location: left > right LE Pain Descriptors / Indicators: Aching;Grimacing;Guarding Pain Intervention(s): Limited activity within patient's tolerance;Monitored during session;Premedicated before session;Repositioned    Home Living                      Prior Function            PT Goals (current goals can now be found in the care plan section) Acute Rehab  PT Goals Patient Stated Goal: to get stronger Progress towards PT goals: Progressing toward goals    Frequency    Min 3X/week      PT Plan Current plan remains appropriate    Co-evaluation              AM-PAC PT "6 Clicks" Daily Activity  Outcome Measure  Difficulty turning over in bed (including adjusting bedclothes, sheets and blankets)?: Unable Difficulty moving from lying on back to sitting on the side of the bed? : Unable Difficulty sitting down on and standing up from a chair with arms (e.g., wheelchair, bedside commode, etc,.)?: A Lot Help needed moving to and from a bed to chair (including a wheelchair)?: A Lot Help needed walking in hospital room?: A Lot Help needed climbing 3-5 steps with a railing? : Total 6 Click Score: 9    End of Session Equipment Utilized During Treatment: Gait belt Activity Tolerance: Patient limited by fatigue;Patient limited by pain Patient left: in chair;with call bell/phone within reach;with chair alarm set;with family/visitor present Nurse Communication: Mobility status PT Visit Diagnosis: Unsteadiness on feet (R26.81);Muscle weakness (generalized) (M62.81)     Time: 2549-8264 PT Time Calculation (min) (ACUTE ONLY): 26 min  Charges:  $Gait Training: 8-22 mins $Self Care/Home Management: 8-22                    G Codes:       Monte Bronder,PT Acute Rehabilitation [phone] (pager)    Denice Paradise 12/14/2017, 12:36 PM

## 2017-12-14 NOTE — Progress Notes (Signed)
  #   3.  S/ W PAUL @ [facility] # [phone] OPT-MEMBER    ELIQUIS 5 MG BID  COVER- YES  CO-PAY- $ 30.00  TIER- 2 DRUG  PRIOR APPROVAL- NO   PREFERRED PHARMACY : [facility]

## 2017-12-14 NOTE — Progress Notes (Signed)
ANTICOAGULATION CONSULT NOTE - Follow Up Consult  Pharmacy Consult for Heparin >> Apixaban Indication: atrial fibrillation  No Known Allergies  Patient Measurements: Height: 5\' 2"  (157.5 cm) Weight: 242 lb 8.1 oz (110 kg) IBW/kg (Calculated) : 50.1  Vital Signs: Temp: 98.5 F (36.9 C) (03/01 0546) Temp Source: Oral (03/01 0546) BP: 134/84 (03/01 0834) Pulse Rate: 93 (03/01 0834)  Labs: Recent Labs    12/12/17 0313 12/12/17 1053 12/13/17 1130 12/14/17 0429  HGB 9.4*  --  8.5* 8.2*  HCT 29.5*  --  25.7* 24.7*  PLT 167  --  203 254  HEPARINUNFRC  --  0.35 0.30 0.35  CREATININE 0.78  --   --  0.83    Estimated Creatinine Clearance: 80.1 mL/min (by C-G formula based on SCr of 0.83 mg/dL).   Medications:  Infusions:  . sodium chloride Stopped (12/11/17 1200)  . sodium chloride Stopped (12/11/17 1200)  . heparin 1,150 Units/hr (12/14/17 0537)    Assessment: 62 year old female with saddle PE and DVT s/p EKOS therapy.  She is changing anticoagulation from Heparin to Apixaban today.  Plan:  Stop Heparin Apixban 10mg  PO BID x 7 days, then 5mg  PO BID  Legrand Como, Pharm.D., BCPS, AAHIVP Clinical Pharmacist Phone: [phone] or [phone] 12/14/2017, 10:50 AM

## 2017-12-14 NOTE — Progress Notes (Signed)
CSW spoke with patient's daughter, [name], who has selected [facility] for rehab. CSW contacted Admissions at [facility] to request that they initiate insurance authorization for patient to admit to SNF. Patient will need insurance approval prior to admission.  CSW will continue to follow.  Laveda Abbe, [place] Clinical Social Worker [phone]

## 2017-12-15 DIAGNOSIS — N390 Urinary tract infection, site not specified: Secondary | ICD-10-CM

## 2017-12-15 DIAGNOSIS — R579 Shock, unspecified: Secondary | ICD-10-CM

## 2017-12-15 DIAGNOSIS — R778 Other specified abnormalities of plasma proteins: Secondary | ICD-10-CM

## 2017-12-15 DIAGNOSIS — N179 Acute kidney failure, unspecified: Secondary | ICD-10-CM

## 2017-12-15 DIAGNOSIS — R7989 Other specified abnormal findings of blood chemistry: Secondary | ICD-10-CM

## 2017-12-15 DIAGNOSIS — B9620 Unspecified Escherichia coli [E. coli] as the cause of diseases classified elsewhere: Secondary | ICD-10-CM

## 2017-12-15 LAB — CBC
HCT: 25.2 % — ABNORMAL LOW (ref 36.0–46.0)
Hemoglobin: 8.1 g/dL — ABNORMAL LOW (ref 12.0–15.0)
MCH: 26.7 pg (ref 26.0–34.0)
MCHC: 32.1 g/dL (ref 30.0–36.0)
MCV: 83.2 fL (ref 78.0–100.0)
Platelets: 327 10*3/uL (ref 150–400)
RBC: 3.03 MIL/uL — ABNORMAL LOW (ref 3.87–5.11)
RDW: 15.2 % (ref 11.5–15.5)
WBC: 14.4 10*3/uL — ABNORMAL HIGH (ref 4.0–10.5)

## 2017-12-15 LAB — CULTURE, BLOOD (ROUTINE X 2)
Culture: NO GROWTH
Special Requests: ADEQUATE

## 2017-12-15 LAB — BASIC METABOLIC PANEL
Anion gap: 8 (ref 5–15)
BUN: 10 mg/dL (ref 6–20)
CO2: 24 mmol/L (ref 22–32)
Calcium: 8.5 mg/dL — ABNORMAL LOW (ref 8.9–10.3)
Chloride: 107 mmol/L (ref 101–111)
Creatinine, Ser: 0.74 mg/dL (ref 0.44–1.00)
GFR calc Af Amer: >60 mL/min (ref >60)
GFR calc non Af Amer: >60 mL/min (ref >60)
Glucose, Bld: 88 mg/dL (ref 65–99)
Potassium: 3.3 mmol/L — ABNORMAL LOW (ref 3.5–5.1)
Sodium: 139 mmol/L (ref 135–145)

## 2017-12-15 LAB — GLUCOSE, CAPILLARY: Glucose-Capillary: 116 mg/dL — ABNORMAL HIGH (ref 65–99)

## 2017-12-15 MED ORDER — POTASSIUM CHLORIDE CRYS ER 20 MEQ PO TBCR
40.0000 meq | EXTENDED_RELEASE_TABLET | Freq: Once | ORAL | Status: AC
  Administered 2017-12-15: 40 meq via ORAL
  Filled 2017-12-15: qty 2

## 2017-12-15 MED ORDER — CEPHALEXIN 500 MG PO CAPS: 500.0000 mg | ORAL_CAPSULE | Freq: Four times a day (QID) | ORAL | 0 refills | Status: AC

## 2017-12-15 MED ORDER — APIXABAN 5 MG PO TABS: 10.0000 mg | ORAL_TABLET | Freq: Two times a day (BID) | ORAL | 0 refills | Status: DC

## 2017-12-15 NOTE — Clinical Social Work Note (Signed)
Clinical Social Worker facilitated patient discharge including contacting patient family and facility to confirm patient discharge plans.  Clinical information faxed to facility and family agreeable with plan.  CSW arranged ambulance transport via [facility] to [facility].  RN to call report prior to discharge.  Clinical Social Worker will sign off for now as social work intervention is no longer needed. Please consult us again if new need arises.  Jesse Kevante Lunt, LCSW [phone] 

## 2017-12-15 NOTE — Progress Notes (Signed)
RN from [facility] has not returned call, called again and explained to reception that [facility] is scheduled to pick patient up at 1300. She transferred  me to nurse supervisor line. No answer no option to leave message.

## 2017-12-15 NOTE — Discharge Summary (Addendum)
Physician Discharge Summary  Kristi Meyers KDX:[id number] DOB: [birth year] DOA: 12/09/2017  PCP: Billie Ruddy, MD  Admit date: 12/09/2017 Discharge date: 12/15/2017  Admitted From: Home Disposition:  SNF   Recommendations for Outpatient Follow-up:  1. Follow up with PCP in 1 week 2. Please obtain BMP/CBC in 1 week to ensure resolution WBC (trending downward at time of discharge) and potassium (hypokalemia replaced prior to discharge)  3. Please follow up on the following pending results: final blood culture result (negative at time of discharge)  4. Abdominal aortic aneurysm maximal diameter is 3.1 cm. Recommend followup by ultrasound in 3 years. 5. Eliquis 10mg  BID for 14 doses (7 days), then transition to 5mg  BID   6. Holding home antihypertensive olmesartan-amlodipine-HCTZ on discharge due to hypotensive/obstructive shock on admission. BP stable on coreg. Resume home antihypertensive as outpatient if BP remains stable.    Discharge Condition: Stable CODE STATUS: Full  Diet recommendation: Heart healthy   Brief/Interim Summary: Kristi Meyers is a 62yo female with history of HTN, HLD, and GERD, presents to the ER with chief complaint of SOB, dizziness, and generalized weakness x 2 days.  In the ER she was found to be in shock with BP 70/42, which improved to 104/77 after 2L IVF. She reports her SBP as an outpatient is typically in 170's. CTA revealed bilateral PEs in right and left main pulmonary arteries as well as saddle embolus with right heart strain. Vascular US showed left leg DVT. She was started on heparin gtt.She underwent placement of left and right pulmonary artery EKOS ultrasound accelerated catheters for initiation of catheter directed thrombolysis 2/25. Patient received tPA and had intermittent epistaxis which has since resolved. Heparin gtt was transitioned to Eliquis. Repeat echocardiogram post EKOS showed EF 97-67%, grade 1 diastolic dysfunction, normal RV systolic  function. On day of discharge, her breathing was stable on room air with no complaints of chest pain, shortness of breath.   Discharge Diagnoses:  Principal Problem:   Acute saddle pulmonary embolus (HCC) Active Problems:   Respiratory distress   Shock (HCC)   Elevated troponin   AKI (acute kidney injury) (HCC)   E. coli UTI  Massive PE, with obstructive shock and respiratory failure, status postEKOS -Now on room air -No bleeding, no epistaxis, transitioned to Eliquis yesterday and doing well  -Repeat TEE shows EF of 60-65% with grade 1 diastolic dysfunction,Right ventriclesystolic function was normal.  Shock due to PE, resolved -Holding home antihypertensive olmesartan-amlodipine-HCTZ on discharge due to hypotensive/obstructive shock on admission. BP stable on coreg. Resume home antihypertensive as outpatient if BP remains stable.   Acute hypoxic respiratory failure -Resolved   Elevated troponin -Likely 2/2 demand  AKI -Resolved  E Coli UTI POA -WBC improving. Sensitive to cefazolin. Discharge on keflex for 3 more days to complete total 5 day course (last day 3/5)   Hypokalemia -Replace PO, repeat lab as outpatient    Discharge Instructions  Discharge Instructions    Diet - low sodium heart healthy   Complete by:  As directed    Increase activity slowly   Complete by:  As directed      Allergies as of 12/15/2017   No Known Allergies     Medication List    STOP taking these medications   diclofenac sodium 1 % Gel Commonly known as:  VOLTAREN   Olmesartan-amLODIPine-HCTZ 40-10-25 MG Tabs   potassium chloride SA 20 MEQ tablet Commonly known as:  K-DUR,KLOR-CON     TAKE these medications  apixaban 5 MG Tabs tablet Commonly known as:  ELIQUIS Take 2 tablets (10 mg total) by mouth 2 (two) times daily for 14 doses.   atorvastatin 40 MG tablet Commonly known as:  LIPITOR Take 40 mg by mouth daily.   carvedilol 25 MG tablet Commonly known as:   COREG Take 25 mg by mouth 2 (two) times daily with a meal.   cephALEXin 500 MG capsule Commonly known as:  KEFLEX Take 1 capsule (500 mg total) by mouth every 6 (six) hours for 3 days.   meclizine 12.5 MG tablet Commonly known as:  ANTIVERT Take 12.5 mg by mouth 2 (two) times daily.   traMADol 50 MG tablet Commonly known as:  ULTRAM Take 1 tablet (50 mg total) by mouth every 8 (eight) hours as needed for severe pain.       Contact information for follow-up providers    Billie Ruddy, MD. Schedule an appointment as soon as possible for a visit in 1 week(s).   Specialty:  Family Medicine Contact information: [address] [place] [address] [phone]            Contact information for after-discharge care    Destination    [facility] Follow up.   Service:  Skilled Nursing Contact information: [address] [place] [id number]                 No Known Allergies  Consultations:  PCCM   IR   Procedures/Studies: Dg Chest 2 View  Result Date: 12/09/2017 CLINICAL DATA:  Hypoxia and cough EXAM: CHEST  2 VIEW COMPARISON:  Chest radiograph 01/18/2005 FINDINGS: The heart size and mediastinal contours are within normal limits. Both lungs are clear. The visualized skeletal structures are unremarkable. IMPRESSION: No active cardiopulmonary disease. Electronically Signed   By: Ulyses Jarred M.D.   On: 12/09/2017 19:13   Ct Angio Chest Pe W And/or Wo Contrast  Addendum Date: 12/10/2017   ADDENDUM REPORT: 12/10/2017 01:11 ADDENDUM: Positive for acute PE with CT evidence of right heart strain (RV/LV Ratio = 1.74) consistent with at least submassive (intermediate risk) PE. The presence of right heart strain has been associated with an increased risk of morbidity and mortality. Please activate Code PE by paging [phone]. Electronically Signed   By: Anner Crete M.D.   On: 12/10/2017 01:11   Addendum  Date: 12/10/2017   ADDENDUM REPORT: 12/10/2017 01:05 ADDENDUM: Positive for acute PE with CTevidence of right heart strain (RV/LV Ratio = 1.74) consistent with at least submassive (intermediate risk) PE. The presence of right heart strain has been associated with an increased risk of morbidity and mortality. Electronically Signed   By: Anner Crete M.D.   On: 12/10/2017 01:05   Result Date: 12/10/2017 CLINICAL DATA:  62 year old female with shortness of breath. Generalized weakness and fatigue. EXAM: CT ANGIOGRAPHY CHEST WITH CONTRAST TECHNIQUE: Multidetector CT imaging of the chest was performed using the standard protocol during bolus administration of intravenous contrast. Multiplanar CT image reconstructions and MIPs were obtained to evaluate the vascular anatomy. CONTRAST:  100 cc Isovue 370 COMPARISON:  Chest radiograph dated 12/09/2017 FINDINGS: Cardiovascular: There is no cardiomegaly or pericardial effusion. There is dilatation of the right ventricle with bowing of the intraventricular septum to the left. The right ventricle measures 5.4 cm in diameter and the left ventricle measures 3.1 cm in diameter. Findings consistent with right ventricular strain. There is moderate calcified and noncalcified plaque of the thoracic aorta.  There is a large saddle pulmonary radiology embolus straddling the bifurcation of the main pulmonary trunk and extending into the bilateral pulmonary arteries. There is occlusion of the lobar and segmental arteries of the lower lobes. There is dilatation of the main pulmonary trunk and central pulmonary arteries. Mediastinum/Nodes: There is no hilar or mediastinal adenopathy. Esophagus and the thyroid gland are grossly unremarkable. No mediastinal fluid collection. Lungs/Pleura: There is a 3 cm subpleural bleb in the left lung base. The lungs are otherwise clear. There is no pleural effusion or pneumothorax. The central airways are patent. Upper Abdomen: There is filling defect  within the visualized intrahepatic IVC (series 5, image 245). This area is only partially visualized and not well evaluated. Although this may be related to mixing artifact findings is concerning for IVC thrombus. Further evaluation with CT of the abdomen pelvis with IV contrast and delayed images in the venous phase recommended. Cholecystectomy. Musculoskeletal: Degenerative changes of the spine. No acute osseous pathology. Review of the MIP images confirms the above findings. IMPRESSION: 1. Large occlusive saddle embolus with findings of right heart straining. 2. Partially visualized filling defect in the intrahepatic IVC concerning for thrombus. Further evaluation with CT of the abdomen and pelvis with IV contrast in venous phase recommended. These results were called by telephone at the time of interpretation on 12/10/2017 at 12:46 am to Dr. Marda Stalker , who verbally acknowledged these results. Electronically Signed: By: Anner Crete M.D. On: 12/10/2017 00:54   Ir Angiogram Pulmonary Bilateral Selective  Result Date: 12/10/2017 INDICATION: 62 year old female presents with submassive pulmonary embolism EXAM: ULTRASOUND GUIDED ACCESS RIGHT COMMON FEMORAL VEIN TIMES TO ACCESS PULMONARY ANGIOGRAM PLACEMENT OF ULTRASOUND ACCELERATED LYSIS INFUSION CATHETERS FOR PE LYSIS COMPARISON:  CT 12/10/2017 MEDICATIONS: None. ANESTHESIA/SEDATION: Versed 2.0 mg IV; Fentanyl 50 mcg IV Moderate Sedation Time:  44 minutes The patient was continuously monitored during the procedure by the interventional radiology nurse under my direct supervision. FLUOROSCOPY TIME:  Fluoroscopy Time: 9 minutes 36 seconds (73 mGy). COMPLICATIONS: None TECHNIQUE: Informed consent was obtained from the patient and the patient's family following explanation of the procedure, risks, benefits and alternatives. Specific risks include bleeding, infection, contrast reaction, kidney injury, venous injury, life-threatening hemorrhage including  brain hemorrhage, gastrointestinal hemorrhage, epistaxis, need for further surgery, need for further procedure, cardiopulmonary collapse, death. The patient understands, agrees and consents for the procedure. All questions were addressed. Patient is position supine position on the fluoroscopy table. Maximal barrier sterile technique utilized including caps, mask, sterile gowns, sterile gloves, large sterile drape, hand hygiene, and betadine prep. 1% lidocaine used for local anesthesia. Moderate sedation was provided. Ultrasound survey of the right inguinal region was performed with images stored and sent to PACs. A single wall needle was used access the right common femoral vein under ultrasound. With venous blood flow returned, an 035 wire was passed through the needle into the IVC. A 6 French sheath was placed over the wire. The dilator was removed and the sheath was flushed. Subsequently, a single wall needle was used access the right common femoral vein adjacent to the initial puncture. With venous blood flow returned, a 6 [place] vascular sheath was placed over the wire. The dilator was removed and the sheath was flushed. Combination of a 5 French pigtail catheter, vertebral catheter and a Bentson wire was then used to select the main pulmonary artery. Bentson wire, Glidewire, and vertebral catheter was then used to navigate into the right-sided pulmonary veins, with selection of lower lobe pulmonary  vein. Glidewire was used to advance into a more distal position and the vertebral catheter was placed into medial segments. Vertebral catheter was removed and a 18 cm infusion length EKOS ultrasound accelerated catheter was placed over the Glidewire. Small contrast infusion confirmed position. Catheter was flushed. Combination of a 5 French pigtail catheter, vertebral catheter and a Bentson wire was then used to select the main pulmonary artery through the second sheath. Wire was removed and a main pulmonary artery  pressure transduction was recorded. Once the catheter was positioned into the lower lobar branches, a 12 cm infusion length EKOS catheter was placed over a Rosen wire. Wire was removed, small amount of contrast confirmed position and catheter was flushed. The electronic wires were then placed through both the left and right catheters. Sheaths were secured in position.  Final image was stored. The patient tolerated the procedure well and remained hemodynamically stable throughout. No complications were encountered and no significant blood loss was encountered. FINDINGS: Ultrasound survey demonstrates patent right common femoral vein. Main pulmonary artery pressure measures 47/9 (26). Final image demonstrates right-sided catheter, 18 cm infusion length, directed into lower lobar branches and left sided catheter, 12 cm infusion length, directed into lower lobar branches. The cranial 6 French (green) sheaths transmits the right-sided catheter. The caudal 6 French (green) sheath transmits the left-sided pulmonary artery catheter. IMPRESSION: Status post placement of left and right pulmonary artery EKOS ultrasound accelerated catheters for initiation of catheter directed thrombolysis. Signed, Dulcy Fanny. Earleen Newport DO [facility] Radiology PLAN: Patient will be ICU status throughout. Bed rest. Both the right and left catheter will receive 1 mg tPA per hour for 12 hours, for a total dose of 24 mg over 12 hours. Plan for repeat trans duction of catheter pressures after treatment, reassessment, and probable removal of catheters. EVery 6 our blood draw, including CBC, heparin level, and fibrinogen. Electronically Signed   By: Corrie Mckusick D.O.   On: 12/10/2017 10:14   Ir Angiogram Selective Each Additional Vessel  Result Date: 12/10/2017 INDICATION: 62 year old female presents with submassive pulmonary embolism EXAM: ULTRASOUND GUIDED ACCESS RIGHT COMMON FEMORAL VEIN TIMES TO  ACCESS PULMONARY ANGIOGRAM PLACEMENT OF ULTRASOUND ACCELERATED LYSIS INFUSION CATHETERS FOR PE LYSIS COMPARISON:  CT 12/10/2017 MEDICATIONS: None. ANESTHESIA/SEDATION: Versed 2.0 mg IV; Fentanyl 50 mcg IV Moderate Sedation Time:  44 minutes The patient was continuously monitored during the procedure by the interventional radiology nurse under my direct supervision. FLUOROSCOPY TIME:  Fluoroscopy Time: 9 minutes 36 seconds (73 mGy). COMPLICATIONS: None TECHNIQUE: Informed consent was obtained from the patient and the patient's family following explanation of the procedure, risks, benefits and alternatives. Specific risks include bleeding, infection, contrast reaction, kidney injury, venous injury, life-threatening hemorrhage including brain hemorrhage, gastrointestinal hemorrhage, epistaxis, need for further surgery, need for further procedure, cardiopulmonary collapse, death. The patient understands, agrees and consents for the procedure. All questions were addressed. Patient is position supine position on the fluoroscopy table. Maximal barrier sterile technique utilized including caps, mask, sterile gowns, sterile gloves, large sterile drape, hand hygiene, and betadine prep. 1% lidocaine used for local anesthesia. Moderate sedation was provided. Ultrasound survey of the right inguinal region was performed with images stored and sent to PACs. A single wall needle was used access the right common femoral vein under ultrasound. With venous blood flow returned, an 035 wire was passed through the needle into the IVC. A 6 French sheath was placed over the wire. The dilator was removed and the sheath  was flushed. Subsequently, a single wall needle was used access the right common femoral vein adjacent to the initial puncture. With venous blood flow returned, a 6 [place] vascular sheath was placed over the wire. The dilator was removed and the sheath was flushed. Combination of a 5 French pigtail catheter, vertebral  catheter and a Bentson wire was then used to select the main pulmonary artery. Bentson wire, Glidewire, and vertebral catheter was then used to navigate into the right-sided pulmonary veins, with selection of lower lobe pulmonary vein. Glidewire was used to advance into a more distal position and the vertebral catheter was placed into medial segments. Vertebral catheter was removed and a 18 cm infusion length EKOS ultrasound accelerated catheter was placed over the Glidewire. Small contrast infusion confirmed position. Catheter was flushed. Combination of a 5 French pigtail catheter, vertebral catheter and a Bentson wire was then used to select the main pulmonary artery through the second sheath. Wire was removed and a main pulmonary artery pressure transduction was recorded. Once the catheter was positioned into the lower lobar branches, a 12 cm infusion length EKOS catheter was placed over a Rosen wire. Wire was removed, small amount of contrast confirmed position and catheter was flushed. The electronic wires were then placed through both the left and right catheters. Sheaths were secured in position.  Final image was stored. The patient tolerated the procedure well and remained hemodynamically stable throughout. No complications were encountered and no significant blood loss was encountered. FINDINGS: Ultrasound survey demonstrates patent right common femoral vein. Main pulmonary artery pressure measures 47/9 (26). Final image demonstrates right-sided catheter, 18 cm infusion length, directed into lower lobar branches and left sided catheter, 12 cm infusion length, directed into lower lobar branches. The cranial 6 French (green) sheaths transmits the right-sided catheter. The caudal 6 French (green) sheath transmits the left-sided pulmonary artery catheter. IMPRESSION: Status post placement of left and right pulmonary artery EKOS ultrasound accelerated catheters for initiation of catheter directed thrombolysis.  Signed, Dulcy Fanny. Earleen Newport DO [facility] Radiology PLAN: Patient will be ICU status throughout. Bed rest. Both the right and left catheter will receive 1 mg tPA per hour for 12 hours, for a total dose of 24 mg over 12 hours. Plan for repeat trans duction of catheter pressures after treatment, reassessment, and probable removal of catheters. EVery 6 our blood draw, including CBC, heparin level, and fibrinogen. Electronically Signed   By: Corrie Mckusick D.O.   On: 12/10/2017 10:14   Ir Angiogram Selective Each Additional Vessel  Result Date: 12/10/2017 INDICATION: 62 year old female presents with submassive pulmonary embolism EXAM: ULTRASOUND GUIDED ACCESS RIGHT COMMON FEMORAL VEIN TIMES TO ACCESS PULMONARY ANGIOGRAM PLACEMENT OF ULTRASOUND ACCELERATED LYSIS INFUSION CATHETERS FOR PE LYSIS COMPARISON:  CT 12/10/2017 MEDICATIONS: None. ANESTHESIA/SEDATION: Versed 2.0 mg IV; Fentanyl 50 mcg IV Moderate Sedation Time:  44 minutes The patient was continuously monitored during the procedure by the interventional radiology nurse under my direct supervision. FLUOROSCOPY TIME:  Fluoroscopy Time: 9 minutes 36 seconds (73 mGy). COMPLICATIONS: None TECHNIQUE: Informed consent was obtained from the patient and the patient's family following explanation of the procedure, risks, benefits and alternatives. Specific risks include bleeding, infection, contrast reaction, kidney injury, venous injury, life-threatening hemorrhage including brain hemorrhage, gastrointestinal hemorrhage, epistaxis, need for further surgery, need for further procedure, cardiopulmonary collapse, death. The patient understands, agrees and consents for the procedure. All questions were addressed. Patient is position supine position on the fluoroscopy table. Maximal barrier sterile technique  utilized including caps, mask, sterile gowns, sterile gloves, large sterile drape, hand hygiene, and betadine prep. 1%  lidocaine used for local anesthesia. Moderate sedation was provided. Ultrasound survey of the right inguinal region was performed with images stored and sent to PACs. A single wall needle was used access the right common femoral vein under ultrasound. With venous blood flow returned, an 035 wire was passed through the needle into the IVC. A 6 French sheath was placed over the wire. The dilator was removed and the sheath was flushed. Subsequently, a single wall needle was used access the right common femoral vein adjacent to the initial puncture. With venous blood flow returned, a 6 [place] vascular sheath was placed over the wire. The dilator was removed and the sheath was flushed. Combination of a 5 French pigtail catheter, vertebral catheter and a Bentson wire was then used to select the main pulmonary artery. Bentson wire, Glidewire, and vertebral catheter was then used to navigate into the right-sided pulmonary veins, with selection of lower lobe pulmonary vein. Glidewire was used to advance into a more distal position and the vertebral catheter was placed into medial segments. Vertebral catheter was removed and a 18 cm infusion length EKOS ultrasound accelerated catheter was placed over the Glidewire. Small contrast infusion confirmed position. Catheter was flushed. Combination of a 5 French pigtail catheter, vertebral catheter and a Bentson wire was then used to select the main pulmonary artery through the second sheath. Wire was removed and a main pulmonary artery pressure transduction was recorded. Once the catheter was positioned into the lower lobar branches, a 12 cm infusion length EKOS catheter was placed over a Rosen wire. Wire was removed, small amount of contrast confirmed position and catheter was flushed. The electronic wires were then placed through both the left and right catheters. Sheaths were secured in position.  Final image was stored. The patient tolerated the procedure well and remained  hemodynamically stable throughout. No complications were encountered and no significant blood loss was encountered. FINDINGS: Ultrasound survey demonstrates patent right common femoral vein. Main pulmonary artery pressure measures 47/9 (26). Final image demonstrates right-sided catheter, 18 cm infusion length, directed into lower lobar branches and left sided catheter, 12 cm infusion length, directed into lower lobar branches. The cranial 6 French (green) sheaths transmits the right-sided catheter. The caudal 6 French (green) sheath transmits the left-sided pulmonary artery catheter. IMPRESSION: Status post placement of left and right pulmonary artery EKOS ultrasound accelerated catheters for initiation of catheter directed thrombolysis. Signed, Dulcy Fanny. Earleen Newport DO [facility] Radiology PLAN: Patient will be ICU status throughout. Bed rest. Both the right and left catheter will receive 1 mg tPA per hour for 12 hours, for a total dose of 24 mg over 12 hours. Plan for repeat trans duction of catheter pressures after treatment, reassessment, and probable removal of catheters. EVery 6 our blood draw, including CBC, heparin level, and fibrinogen. Electronically Signed   By: Corrie Mckusick D.O.   On: 12/10/2017 10:14   Ir US Guide Vasc Access Right  Result Date: 12/10/2017 INDICATION: 62 year old female presents with submassive pulmonary embolism EXAM: ULTRASOUND GUIDED ACCESS RIGHT COMMON FEMORAL VEIN TIMES TO ACCESS PULMONARY ANGIOGRAM PLACEMENT OF ULTRASOUND ACCELERATED LYSIS INFUSION CATHETERS FOR PE LYSIS COMPARISON:  CT 12/10/2017 MEDICATIONS: None. ANESTHESIA/SEDATION: Versed 2.0 mg IV; Fentanyl 50 mcg IV Moderate Sedation Time:  44 minutes The patient was continuously monitored during the procedure by the interventional radiology nurse under my direct supervision. FLUOROSCOPY TIME:  Fluoroscopy Time: 9 minutes 36 seconds (73 mGy). COMPLICATIONS: None TECHNIQUE:  Informed consent was obtained from the patient and the patient's family following explanation of the procedure, risks, benefits and alternatives. Specific risks include bleeding, infection, contrast reaction, kidney injury, venous injury, life-threatening hemorrhage including brain hemorrhage, gastrointestinal hemorrhage, epistaxis, need for further surgery, need for further procedure, cardiopulmonary collapse, death. The patient understands, agrees and consents for the procedure. All questions were addressed. Patient is position supine position on the fluoroscopy table. Maximal barrier sterile technique utilized including caps, mask, sterile gowns, sterile gloves, large sterile drape, hand hygiene, and betadine prep. 1% lidocaine used for local anesthesia. Moderate sedation was provided. Ultrasound survey of the right inguinal region was performed with images stored and sent to PACs. A single wall needle was used access the right common femoral vein under ultrasound. With venous blood flow returned, an 035 wire was passed through the needle into the IVC. A 6 French sheath was placed over the wire. The dilator was removed and the sheath was flushed. Subsequently, a single wall needle was used access the right common femoral vein adjacent to the initial puncture. With venous blood flow returned, a 6 [place] vascular sheath was placed over the wire. The dilator was removed and the sheath was flushed. Combination of a 5 French pigtail catheter, vertebral catheter and a Bentson wire was then used to select the main pulmonary artery. Bentson wire, Glidewire, and vertebral catheter was then used to navigate into the right-sided pulmonary veins, with selection of lower lobe pulmonary vein. Glidewire was used to advance into a more distal position and the vertebral catheter was placed into medial segments. Vertebral catheter was removed and a 18 cm infusion length EKOS ultrasound accelerated catheter was placed over the  Glidewire. Small contrast infusion confirmed position. Catheter was flushed. Combination of a 5 French pigtail catheter, vertebral catheter and a Bentson wire was then used to select the main pulmonary artery through the second sheath. Wire was removed and a main pulmonary artery pressure transduction was recorded. Once the catheter was positioned into the lower lobar branches, a 12 cm infusion length EKOS catheter was placed over a Rosen wire. Wire was removed, small amount of contrast confirmed position and catheter was flushed. The electronic wires were then placed through both the left and right catheters. Sheaths were secured in position.  Final image was stored. The patient tolerated the procedure well and remained hemodynamically stable throughout. No complications were encountered and no significant blood loss was encountered. FINDINGS: Ultrasound survey demonstrates patent right common femoral vein. Main pulmonary artery pressure measures 47/9 (26). Final image demonstrates right-sided catheter, 18 cm infusion length, directed into lower lobar branches and left sided catheter, 12 cm infusion length, directed into lower lobar branches. The cranial 6 French (green) sheaths transmits the right-sided catheter. The caudal 6 French (green) sheath transmits the left-sided pulmonary artery catheter. IMPRESSION: Status post placement of left and right pulmonary artery EKOS ultrasound accelerated catheters for initiation of catheter directed thrombolysis. Signed, Dulcy Fanny. Earleen Newport DO [facility] Radiology PLAN: Patient will be ICU status throughout. Bed rest. Both the right and left catheter will receive 1 mg tPA per hour for 12 hours, for a total dose of 24 mg over 12 hours. Plan for repeat trans duction of catheter pressures after treatment, reassessment, and probable removal of catheters. EVery 6 our blood draw, including CBC, heparin level, and fibrinogen.  Electronically Signed   By: Corrie Mckusick D.O.  On: 12/10/2017 10:14   Dg Chest Port 1 View  Result Date: 12/12/2017 CLINICAL DATA:  Respiratory distress. EXAM: PORTABLE CHEST 1 VIEW COMPARISON:  Chest radiographs 12/09/2017 and CTA 12/10/2017 FINDINGS: The cardiac silhouette is mildly enlarged. The lungs are hypoinflated with new central pulmonary vascular congestion. There is no overt edema. No sizable pleural effusion or pneumothorax is identified. Thoracic spondylosis is noted. IMPRESSION: Hypoinflation with pulmonary vascular congestion. Electronically Signed   By: Logan Bores M.D.   On: 12/12/2017 10:26   Ct Venogram Abd/pel  Result Date: 12/10/2017 CLINICAL DATA:  Submassive pulmonary embolism. 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 EXAM: CT VENOGRAM ABD-PELVIS TECHNIQUE: Multidetector CT imaging of the abdomen and pelvis was performed using the standard protocol during bolus administration of intravenous contrast. Multiplanar reconstructed images and MIPs were obtained and reviewed to evaluate the vascular anatomy. CONTRAST:  124mL ISOVUE-300 IOPAMIDOL (ISOVUE-300) INJECTION 61% COMPARISON:  01/05/2005 FINDINGS: VASCULAR Veins: Delayed phase imaging demonstrates no evidence of DVT. Specifically there is no evidence of thrombus within the IVC. Some mixing artifact is noted. The abnormality on the accompanying chest CT represents artifact. Arterial: Portal venous phase imaging demonstrates atherosclerotic changes of the abdominal aorta. Maximal diameter of the abdominal aorta is 3.1 cm. 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 There is ectasia of the right internal iliac artery. Review of the MIP images confirms the above findings. NON-VASCULAR Lower chest: Mild dependent atelectasis. Hepatobiliary: Postcholecystectomy. Tiny cyst in the right lobe inferiorly on image 33 of series 3. Pancreas: Unremarkable Spleen:  Unremarkable Adrenals/Urinary Tract: Kidneys are within normal limits. There is contrast in the collecting system from the previous scan. Tiny cysts are present in the kidneys. Adrenal glands are within normal limits. Bladder is decompressed by Foley catheter. Stomach/Bowel: Stomach and duodenum are within normal limits. There is no evidence of small-bowel obstruction. Normal appendix. No obvious mass in the colon. Lymphatic: No abnormal retroperitoneal adenopathy. Reproductive: Uterus and adnexa are within normal limits. Other: Tiny sebaceous cyst in the ischial rectal fossa on image 92 of series 3. No free-fluid. Musculoskeletal: No vertebral compression deformity. Advanced multilevel degenerative disc disease in the lumbar spine. IMPRESSION: VASCULAR There is no evidence of DVT on this study. Specifically, there is no evidence of thrombus in the IVC. The finding and noted on the accompanying chest CT represents artifact. Abdominal aortic aneurysm maximal diameter is 3.1 cm. Recommend followup by ultrasound in 3 years. This recommendation follows ACR consensus guidelines: White Paper of the ACR Incidental Findings Committee II on Vascular Findings. 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 NON-VASCULAR Chronic changes. Electronically Signed   By: Marybelle Killings M.D.   On: 12/10/2017 06:38   Ir Infusion Thrombol Arterial Initial (ms)  Result Date: 12/10/2017 INDICATION: 62 year old female presents with submassive pulmonary embolism EXAM: ULTRASOUND GUIDED ACCESS RIGHT COMMON FEMORAL VEIN TIMES TO ACCESS PULMONARY ANGIOGRAM PLACEMENT OF ULTRASOUND ACCELERATED LYSIS INFUSION CATHETERS FOR PE LYSIS COMPARISON:  CT 12/10/2017 MEDICATIONS: None. ANESTHESIA/SEDATION: Versed 2.0 mg IV; Fentanyl 50 mcg IV Moderate Sedation Time:  44 minutes The patient was continuously monitored during the procedure by the interventional radiology nurse under my direct supervision. FLUOROSCOPY  TIME:  Fluoroscopy Time: 9 minutes 36 seconds (73 mGy). COMPLICATIONS: None TECHNIQUE: Informed consent was obtained from the patient and the patient's family following explanation of the procedure, risks, benefits and alternatives. Specific risks include bleeding, infection, contrast reaction, kidney  injury, venous injury, life-threatening hemorrhage including brain hemorrhage, gastrointestinal hemorrhage, epistaxis, need for further surgery, need for further procedure, cardiopulmonary collapse, death. The patient understands, agrees and consents for the procedure. All questions were addressed. Patient is position supine position on the fluoroscopy table. Maximal barrier sterile technique utilized including caps, mask, sterile gowns, sterile gloves, large sterile drape, hand hygiene, and betadine prep. 1% lidocaine used for local anesthesia. Moderate sedation was provided. Ultrasound survey of the right inguinal region was performed with images stored and sent to PACs. A single wall needle was used access the right common femoral vein under ultrasound. With venous blood flow returned, an 035 wire was passed through the needle into the IVC. A 6 French sheath was placed over the wire. The dilator was removed and the sheath was flushed. Subsequently, a single wall needle was used access the right common femoral vein adjacent to the initial puncture. With venous blood flow returned, a 6 [place] vascular sheath was placed over the wire. The dilator was removed and the sheath was flushed. Combination of a 5 French pigtail catheter, vertebral catheter and a Bentson wire was then used to select the main pulmonary artery. Bentson wire, Glidewire, and vertebral catheter was then used to navigate into the right-sided pulmonary veins, with selection of lower lobe pulmonary vein. Glidewire was used to advance into a more distal position and the vertebral catheter was placed into medial segments. Vertebral catheter was removed  and a 18 cm infusion length EKOS ultrasound accelerated catheter was placed over the Glidewire. Small contrast infusion confirmed position. Catheter was flushed. Combination of a 5 French pigtail catheter, vertebral catheter and a Bentson wire was then used to select the main pulmonary artery through the second sheath. Wire was removed and a main pulmonary artery pressure transduction was recorded. Once the catheter was positioned into the lower lobar branches, a 12 cm infusion length EKOS catheter was placed over a Rosen wire. Wire was removed, small amount of contrast confirmed position and catheter was flushed. The electronic wires were then placed through both the left and right catheters. Sheaths were secured in position.  Final image was stored. The patient tolerated the procedure well and remained hemodynamically stable throughout. No complications were encountered and no significant blood loss was encountered. FINDINGS: Ultrasound survey demonstrates patent right common femoral vein. Main pulmonary artery pressure measures 47/9 (26). Final image demonstrates right-sided catheter, 18 cm infusion length, directed into lower lobar branches and left sided catheter, 12 cm infusion length, directed into lower lobar branches. The cranial 6 French (green) sheaths transmits the right-sided catheter. The caudal 6 French (green) sheath transmits the left-sided pulmonary artery catheter. IMPRESSION: Status post placement of left and right pulmonary artery EKOS ultrasound accelerated catheters for initiation of catheter directed thrombolysis. Signed, Dulcy Fanny. Earleen Newport DO [facility] Radiology PLAN: Patient will be ICU status throughout. Bed rest. Both the right and left catheter will receive 1 mg tPA per hour for 12 hours, for a total dose of 24 mg over 12 hours. Plan for repeat trans duction of catheter pressures after treatment, reassessment, and probable removal of  catheters. EVery 6 our blood draw, including CBC, heparin level, and fibrinogen. Electronically Signed   By: Corrie Mckusick D.O.   On: 12/10/2017 10:14   Ir Infusion Thrombol Arterial Initial (ms)  Result Date: 12/10/2017 INDICATION: 62 year old female presents with submassive pulmonary embolism EXAM: ULTRASOUND GUIDED ACCESS RIGHT COMMON FEMORAL VEIN TIMES TO ACCESS PULMONARY ANGIOGRAM PLACEMENT OF  ULTRASOUND ACCELERATED LYSIS INFUSION CATHETERS FOR PE LYSIS COMPARISON:  CT 12/10/2017 MEDICATIONS: None. ANESTHESIA/SEDATION: Versed 2.0 mg IV; Fentanyl 50 mcg IV Moderate Sedation Time:  44 minutes The patient was continuously monitored during the procedure by the interventional radiology nurse under my direct supervision. FLUOROSCOPY TIME:  Fluoroscopy Time: 9 minutes 36 seconds (73 mGy). COMPLICATIONS: None TECHNIQUE: Informed consent was obtained from the patient and the patient's family following explanation of the procedure, risks, benefits and alternatives. Specific risks include bleeding, infection, contrast reaction, kidney injury, venous injury, life-threatening hemorrhage including brain hemorrhage, gastrointestinal hemorrhage, epistaxis, need for further surgery, need for further procedure, cardiopulmonary collapse, death. The patient understands, agrees and consents for the procedure. All questions were addressed. Patient is position supine position on the fluoroscopy table. Maximal barrier sterile technique utilized including caps, mask, sterile gowns, sterile gloves, large sterile drape, hand hygiene, and betadine prep. 1% lidocaine used for local anesthesia. Moderate sedation was provided. Ultrasound survey of the right inguinal region was performed with images stored and sent to PACs. A single wall needle was used access the right common femoral vein under ultrasound. With venous blood flow returned, an 035 wire was passed through the needle into the IVC. A 6 French sheath was placed over the wire.  The dilator was removed and the sheath was flushed. Subsequently, a single wall needle was used access the right common femoral vein adjacent to the initial puncture. With venous blood flow returned, a 6 [place] vascular sheath was placed over the wire. The dilator was removed and the sheath was flushed. Combination of a 5 French pigtail catheter, vertebral catheter and a Bentson wire was then used to select the main pulmonary artery. Bentson wire, Glidewire, and vertebral catheter was then used to navigate into the right-sided pulmonary veins, with selection of lower lobe pulmonary vein. Glidewire was used to advance into a more distal position and the vertebral catheter was placed into medial segments. Vertebral catheter was removed and a 18 cm infusion length EKOS ultrasound accelerated catheter was placed over the Glidewire. Small contrast infusion confirmed position. Catheter was flushed. Combination of a 5 French pigtail catheter, vertebral catheter and a Bentson wire was then used to select the main pulmonary artery through the second sheath. Wire was removed and a main pulmonary artery pressure transduction was recorded. Once the catheter was positioned into the lower lobar branches, a 12 cm infusion length EKOS catheter was placed over a Rosen wire. Wire was removed, small amount of contrast confirmed position and catheter was flushed. The electronic wires were then placed through both the left and right catheters. Sheaths were secured in position.  Final image was stored. The patient tolerated the procedure well and remained hemodynamically stable throughout. No complications were encountered and no significant blood loss was encountered. FINDINGS: Ultrasound survey demonstrates patent right common femoral vein. Main pulmonary artery pressure measures 47/9 (26). Final image demonstrates right-sided catheter, 18 cm infusion length, directed into lower lobar branches and left sided catheter, 12 cm infusion  length, directed into lower lobar branches. The cranial 6 French (green) sheaths transmits the right-sided catheter. The caudal 6 French (green) sheath transmits the left-sided pulmonary artery catheter. IMPRESSION: Status post placement of left and right pulmonary artery EKOS ultrasound accelerated catheters for initiation of catheter directed thrombolysis. Signed, Dulcy Fanny. Earleen Newport DO [facility] Radiology PLAN: Patient will be ICU status throughout. Bed rest. Both the right and left catheter will receive 1 mg tPA per hour for  12 hours, for a total dose of 24 mg over 12 hours. Plan for repeat trans duction of catheter pressures after treatment, reassessment, and probable removal of catheters. EVery 6 our blood draw, including CBC, heparin level, and fibrinogen. Electronically Signed   By: Corrie Mckusick D.O.   On: 12/10/2017 10:14   Ir Jacolyn Reedy F/u Eval Art/ven Final Day (ms)  Result Date: 12/11/2017 INDICATION: Status post completion of 12 hours of bilateral catheter directed ultrasound assisted thrombolytic therapy to treat bilateral submassive pulmonary embolism. Thrombolytic therapy was complicated by a significant nosebleed and some associated drop in hemoglobin. Epistaxis has ceased after completion of thrombolytic therapy. EXAM: FOLLOW-UP OF PULMONARY ARTERIAL THROMBOLYTIC THERAPY, FINAL DAY MEDICATIONS: None ANESTHESIA/SEDATION: None CONTRAST:  None FLUOROSCOPY TIME:  None COMPLICATIONS: None immediate. PROCEDURE: Catheters were retracted into the main pulmonary artery. Repeat pulmonary artery pressures were obtained. FINDINGS: Repeat pulmonary artery pressure is 38/29 (32) mm Hg. Prior to infusion, documented pulmonary artery pressure was 47/9 (26) mm Hg. IMPRESSION: There is some decrease in systolic pulmonary arterial pressure after bilateral ultrasound assisted thrombolytic therapy. Mean pressure did not decrease due to higher measured diastolic  pressure. Due to significant epistaxis overnight, further thrombolytic therapy will not be performed. Electronically Signed   By: Aletta Edouard M.D.   On: 12/11/2017 15:46    Vascular US LE 2/25   Right: There is no evidence of deep vein thrombosis in the lower extremity. No cystic structure found in the popliteal fossa. Left: There is evidence of acute DVT in the Femoral vein, Popliteal vein, Posterior Tibial veins, and Peroneal veins. No cystic structure found in the popliteal fossa.  Noted: bilateral PTA waveforms WNL.   Echo 2/25 Study Conclusions  - Impressions: No acoustic windows available to perform echo.   Internal vibration interferes with transmission.  Impressions:  - No acoustic windows available to perform echo. Internal vibration   interferes with transmission.   Echo 2/27 Study Conclusions  - Left ventricle: The cavity size was normal. There was moderate   concentric hypertrophy. Systolic function was normal. The   estimated ejection fraction was in the range of 60% to 65%. Wall   motion was normal; there were no regional wall motion   abnormalities. Doppler parameters are consistent with abnormal   left ventricular relaxation (grade 1 diastolic dysfunction). - Aortic valve: Transvalvular velocity was within the normal range.   There was no stenosis. There was no regurgitation. - Mitral valve: Transvalvular velocity was within the normal range.   There was no evidence for stenosis. There was no regurgitation. - Right ventricle: The cavity size was normal. Wall thickness was   normal. Systolic function was normal. - Tricuspid valve: There was no regurgitation.   Discharge Exam:  Vitals:   12/15/17 0500 12/15/17 0550 12/15/17 0801 12/15/17 0804  BP:  (!) 158/87 126/66 126/66  Pulse:  96 94 94  Resp:  16    Temp:  98.5 F (36.9 C)  98.5 F (36.9 C)  TempSrc:  Axillary  Oral  SpO2:  97%    Weight: 111.7 kg (246 lb 4.1 oz)     Height:         General: Pt is alert, awake, not in acute distress Cardiovascular: RRR, S1/S2 +, no rubs, no gallops Respiratory: CTA bilaterally, no wheezing, no rhonchi Abdominal: Soft, NT, ND, bowel sounds + Extremities: +LLE trace edema, no cyanosis    The results of significant diagnostics from this hospitalization (including imaging, microbiology, ancillary and laboratory) are  listed below for reference.     Microbiology: Recent Results (from the past 240 hour(s))  Urine culture     Status: Abnormal   Collection Time: 12/09/17  6:09 PM  Result Value Ref Range Status   Specimen Description   Final    URINE, CLEAN CATCH Performed at [facility] [address], [place], [place] [address]    Special Requests   Final    NONE Performed at [facility], [place] [address], [place], [place] [address]    Culture >=100,000 COLONIES/mL ESCHERICHIA COLI (A)  Final   Report Status 12/12/2017 FINAL  Final   Organism ID, Bacteria ESCHERICHIA COLI (A)  Final      Susceptibility   Escherichia coli - MIC*    AMPICILLIN 16 INTERMEDIATE Intermediate     CEFAZOLIN <=4 SENSITIVE Sensitive     CEFTRIAXONE <=1 SENSITIVE Sensitive     CIPROFLOXACIN <=0.25 SENSITIVE Sensitive     GENTAMICIN <=1 SENSITIVE Sensitive     IMIPENEM <=0.25 SENSITIVE Sensitive     NITROFURANTOIN <=16 SENSITIVE Sensitive     TRIMETH/SULFA <=20 SENSITIVE Sensitive     AMPICILLIN/SULBACTAM 4 SENSITIVE Sensitive     PIP/TAZO <=4 SENSITIVE Sensitive     Extended ESBL NEGATIVE Sensitive     * >=100,000 COLONIES/mL ESCHERICHIA COLI  Blood culture (routine x 2)     Status: None (Preliminary result)   Collection Time: 12/09/17  6:14 PM  Result Value Ref Range Status   Specimen Description   Final    RIGHT ANTECUBITAL Performed at [facility] [address] [place], [place] [address]    Special Requests   Final    BOTTLES DRAWN AEROBIC AND ANAEROBIC Blood Culture adequate  volume Performed at [facility] [address], [place], [place] [address]    Culture   Final    NO GROWTH 4 DAYS Performed at [facility], [place] [address] [place], [place] [address]    Report Status PENDING  Incomplete  MRSA PCR Screening     Status: None   Collection Time: 12/10/17  7:55 AM  Result Value Ref Range Status   MRSA by PCR NEGATIVE NEGATIVE Final    Comment:        The GeneXpert MRSA Assay (FDA approved for NASAL specimens only), is one component of a comprehensive MRSA colonization surveillance program. It is not intended to diagnose MRSA infection nor to guide or monitor treatment for MRSA infections. Performed at [facility], [place] [address], [place],  [address]      Labs: BNP (last 3 results) No results for input(s): BNP in the last 8760 hours. Basic Metabolic Panel: Recent Labs  Lab 12/09/17 1837 12/11/17 0423 12/12/17 0313 12/14/17 0429 12/15/17 0232  NA 141 144 140 139 139  K 4.1 2.9* 4.9 3.5 3.3*  CL 110 120* 110 107 107  CO2 22 19* 18* 22 24  GLUCOSE 130* 82 64* 84 88  BUN 22* 9 6 10 10   CREATININE 1.24* 0.64 0.78 0.83 0.74  CALCIUM 8.3* 6.5* 8.5* 8.3* 8.5*  MG 2.0 1.4* 2.1  --   --   PHOS  --  1.4* 1.8*  --   --    Liver Function Tests: Recent Labs  Lab 12/09/17 1837 12/14/17 0429  AST 15 17  ALT 16 17  ALKPHOS 71 60  BILITOT 0.8 0.9  PROT 6.4* 5.6*  ALBUMIN 2.9* 2.3*   Recent Labs  Lab 12/09/17 1837  LIPASE 20   No results for input(s): AMMONIA in the last 168 hours. CBC: Recent Labs  Lab 12/09/17 1837  12/11/17 0337 12/12/17 0313 12/13/17 1130 12/14/17 0429 12/15/17 0232  WBC 13.0*   < > 11.6* 16.5* 17.2* 15.9* 14.4*  NEUTROABS 9.9*  --   --   --   --   --   --   HGB 11.0*   < > 8.3* 9.4* 8.5* 8.2* 8.1*  HCT 33.6*   < > 25.9* 29.5* 25.7* 24.7* 25.2*  MCV 84.6   < > 84.1 85.5 83.4 82.9 83.2  PLT 199   < > 134* 167 203 254 327   < > = values in this interval not displayed.    Cardiac Enzymes: No results for input(s): CKTOTAL, CKMB, CKMBINDEX, TROPONINI in the last 168 hours. BNP: Invalid input(s): POCBNP CBG: Recent Labs  Lab 12/12/17 0828 12/12/17 1509 12/13/17 1109 12/14/17 0750 12/15/17 0737  GLUCAP 145* 135* 90 77 116*   D-Dimer No results for input(s): DDIMER in the last 72 hours. Hgb A1c No results for input(s): HGBA1C in the last 72 hours. Lipid Profile No results for input(s): CHOL, HDL, LDLCALC, TRIG, CHOLHDL, LDLDIRECT in the last 72 hours. Thyroid function studies No results for input(s): TSH, T4TOTAL, T3FREE, THYROIDAB in the last 72 hours.  Invalid input(s): FREET3 Anemia work up No results for input(s): VITAMINB12, FOLATE, FERRITIN, TIBC, IRON, RETICCTPCT in the last 72 hours. Urinalysis    Component Value Date/Time   COLORURINE AMBER (A) 12/09/2017 1809   APPEARANCEUR CLOUDY (A) 12/09/2017 1809   LABSPEC 1.021 12/09/2017 1809   PHURINE 5.0 12/09/2017 1809   GLUCOSEU NEGATIVE 12/09/2017 1809   HGBUR NEGATIVE 12/09/2017 1809   BILIRUBINUR NEGATIVE 12/09/2017 1809   KETONESUR 5 (A) 12/09/2017 1809   PROTEINUR 100 (A) 12/09/2017 1809   NITRITE NEGATIVE 12/09/2017 1809   LEUKOCYTESUR NEGATIVE 12/09/2017 1809   Sepsis Labs Invalid input(s): PROCALCITONIN,  WBC,  LACTICIDVEN Microbiology Recent Results (from the past 240 hour(s))  Urine culture     Status: Abnormal   Collection Time: 12/09/17  6:09 PM  Result Value Ref Range Status   Specimen Description   Final    URINE, CLEAN CATCH Performed at [facility], [place] [address], [place], [place] [address]    Special Requests   Final    NONE Performed at [facility], [place] [address], [place], [place] [address]    Culture >=100,000 COLONIES/mL ESCHERICHIA COLI (A)  Final   Report Status 12/12/2017 FINAL  Final   Organism ID, Bacteria ESCHERICHIA COLI (A)  Final      Susceptibility   Escherichia coli - MIC*    AMPICILLIN 16  INTERMEDIATE Intermediate     CEFAZOLIN <=4 SENSITIVE Sensitive     CEFTRIAXONE <=1 SENSITIVE Sensitive     CIPROFLOXACIN <=0.25 SENSITIVE Sensitive     GENTAMICIN <=1 SENSITIVE Sensitive     IMIPENEM <=0.25 SENSITIVE Sensitive     NITROFURANTOIN <=16 SENSITIVE Sensitive     TRIMETH/SULFA <=20 SENSITIVE Sensitive     AMPICILLIN/SULBACTAM 4 SENSITIVE Sensitive     PIP/TAZO <=4 SENSITIVE Sensitive     Extended ESBL NEGATIVE Sensitive     * >=100,000 COLONIES/mL ESCHERICHIA COLI  Blood culture (routine x 2)     Status: None (Preliminary result)   Collection Time: 12/09/17  6:14 PM  Result Value Ref Range Status   Specimen Description   Final    RIGHT ANTECUBITAL  Performed at [facility], [place] [address], [place], [place] [address]    Special Requests   Final    BOTTLES DRAWN AEROBIC AND ANAEROBIC Blood Culture adequate volume Performed at [place] [address], [place], [place] [address]    Culture   Final    NO GROWTH 4 DAYS Performed at [facility], [place] [address], [place], [place] [address]    Report Status PENDING  Incomplete  MRSA PCR Screening     Status: None   Collection Time: 12/10/17  7:55 AM  Result Value Ref Range Status   MRSA by PCR NEGATIVE NEGATIVE Final    Comment:        The GeneXpert MRSA Assay (FDA approved for NASAL specimens only), is one component of a comprehensive MRSA colonization surveillance program. It is not intended to diagnose MRSA infection nor to guide or monitor treatment for MRSA infections. Performed at [facility], [place] [address], [place], [place] [address]      Patient was seen and examined on the day of discharge and was found to be in stable condition. Time coordinating discharge: 40 minutes including assessment and coordination of care, as well as examination of the patient.   SIGNED:  Dessa Phi, DO [facility] Pager [phone]  If 7PM-7AM, please  contact night-coverage [email or web address] Password [facility] 12/15/2017, 8:38 AM

## 2017-12-15 NOTE — Progress Notes (Signed)
[place] to give report, spoke to [place] she transferred me to RN and received no answer left my phone number for the RN to return my call.

## 2017-12-15 NOTE — Clinical Social Work Placement (Signed)
   CLINICAL SOCIAL WORK PLACEMENT  NOTE  Date:  12/15/2017  Patient Details  Name: Kristi Meyers MRN: [id number] Date of Birth: Jan 15, 1956  Clinical Social Work is seeking post-discharge placement for this patient at the [place] level of care (*CSW will initial, date and re-position this form in  chart as items are completed):  Yes   Patient/family provided with [place] Work Department's list of facilities offering this level of care within the geographic area requested by the patient (or if unable, by the patient's family).  Yes   Patient/family informed of their freedom to choose among providers that offer the needed level of care, that participate in Medicare, Medicaid or managed care program needed by the patient, have an available bed and are willing to accept the patient.  Yes   Patient/family informed of [place]'s ownership interest in [facility] and [facility], as well as of the fact that they are under no obligation to receive care at these facilities.  PASRR submitted to EDS on 12/13/17     PASRR number received on 12/13/17     Existing PASRR number confirmed on       FL2 transmitted to all facilities in geographic area requested by pt/family on 12/13/17     FL2 transmitted to all facilities within larger geographic area on       Patient informed that his/her managed care company has contracts with or will negotiate with certain facilities, including the following:        Yes   Patient/family informed of bed offers received.  Patient chooses bed at [facility]     Physician recommends and patient chooses bed at      Patient to be transferred to [facility] on 12/15/17.  Patient to be transferred to facility by Ambulance     Patient family notified on 12/15/17 of transfer.  Name of family member notified:  Patient daughter over the phone and husband at bedside     PHYSICIAN       Additional Comment:    Barbette Or, [place]

## 2018-02-18 ENCOUNTER — Encounter: Payer: Self-pay | Admitting: Family Medicine

## 2018-02-18 ENCOUNTER — Ambulatory Visit: Payer: BC Managed Care – PPO | Admitting: Family Medicine

## 2018-02-18 VITALS — BP 124/70 | HR 76 | Temp 98.4°F | Resp 18 | Ht 62.0 in | Wt 196.6 lb

## 2018-02-18 DIAGNOSIS — I2602 Saddle embolus of pulmonary artery with acute cor pulmonale: Secondary | ICD-10-CM | POA: Diagnosis not present

## 2018-02-18 DIAGNOSIS — I714 Abdominal aortic aneurysm, without rupture, unspecified: Secondary | ICD-10-CM

## 2018-02-18 DIAGNOSIS — I10 Essential (primary) hypertension: Secondary | ICD-10-CM

## 2018-02-18 DIAGNOSIS — M1990 Unspecified osteoarthritis, unspecified site: Secondary | ICD-10-CM | POA: Diagnosis not present

## 2018-02-18 MED ORDER — APIXABAN 5 MG PO TABS: 5.0000 mg | ORAL_TABLET | Freq: Two times a day (BID) | ORAL | 2 refills | Status: DC

## 2018-02-18 NOTE — Progress Notes (Signed)
Subjective:    Patient ID: Kristi Meyers, female    DOB: May 12, 1956, 62 y.o.   MRN: [id number]  No chief complaint on file.   HPI Patient was seen today for f/u.  Pt was hospitalized 2/24-12/15/17 for shortness of breath, dizziness, generalized weakness with hypotension, later found to have PEs in right and left pulmonary arteries and saddle pulmonary embolism with acute cor pulmonale.  [place] also revealed L leg DVT.  Pt was started on Heparin and underwent catheter directed thrombolysis on 2/25.  Pt was transitioned to Eliquis.    Since d/c pt states she did not know she was suppose to f/u 1 wk after d/c.  Pt states her breathing is better, but she is not at her baseline.  Pt endorses working with PT.  Pt state she was told to stay out of work until June.  Pt states she is trying to get Short term disability but has to use "her days" first.  Pt was hoping to retire in Feb 2020. Pt states she has been taking Eliquis without issue.  Pt does note lower extremity edema at the end of the day.  Patient has been elevating her feet and wearing compression socks.  Of note patient quit smoking.  During hospital stay an Abdominal aortic aneurysm 3.1 cm was noted on CT venogram abd/pelvis 12/10/17.  Recommend follow-up ultrasound in 3 years.  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  Pt also notes her R knee pain has improved s/p steroid injections.  She had the injection ~1 month ago.  Prior to her episode of PEs pt states Ortho was considering TKR.  Those plans have since been tabled. Past Medical History:  Diagnosis Date  . GERD (gastroesophageal reflux disease)   . High cholesterol   . Hypertension   . Vertigo     No Known Allergies  ROS General: Denies fever, chills, night sweats, changes in weight, changes in appetite   +improving fatigue HEENT: Denies headaches, ear pain, changes in vision, rhinorrhea, sore throat  CV: Denies CP, palpitations, SOB, orthopnea  +LE edema Pulm: Denies  SOB, cough, wheezing GI: Denies abdominal pain, nausea, vomiting, diarrhea, constipation GU: Denies dysuria, hematuria, frequency, vaginal discharge Msk: Denies muscle cramps, joint pains Neuro: Denies weakness, numbness, tingling Skin: Denies rashes, bruising Psych: Denies depression, anxiety, hallucinations     Objective:    Blood pressure 124/70, pulse 76, temperature 98.4 F (36.9 C), temperature source Oral, resp. rate 18, height 5\' 2"  (1.575 m), weight 196 lb 9.6 oz (89.2 kg), last menstrual period 11/17/2008, SpO2 97 %.   Gen. Pleasant, well-nourished, in no distress, normal affect   HEENT: [place]/AT, face symmetric, no scleral icterus, PERRLA, nares patent without drainage, pharynx without erythema or exudate. Lungs: no accessory muscle use, CTAB, no wheezes or rales Cardiovascular: RRR, no m/r/g, no peripheral edema Musculoskeletal: No deformities, no cyanosis or clubbing, normal tone Neuro:  A&Ox3, CN II-XII intact, slowed gait, ambulating with a cane. Skin:  Warm, no lesions/ rash   Wt Readings from Last 3 Encounters:  02/18/18 196 lb 9.6 oz (89.2 kg)  12/15/17 246 lb 4.1 oz (111.7 kg)  11/08/17 203 lb 11.2 oz (92.4 kg)    Lab Results  Component Value Date   WBC 14.4 (H) 12/15/2017   HGB 8.1 (L) 12/15/2017   HCT 25.2 (L) 12/15/2017   PLT 327 12/15/2017   GLUCOSE 88 12/15/2017   ALT 17 12/14/2017   AST 17 12/14/2017   NA 139  12/15/2017   K 3.3 (L) 12/15/2017   CL 107 12/15/2017   CREATININE 0.74 12/15/2017   BUN 10 12/15/2017   CO2 24 12/15/2017   TSH 1.187 12/09/2017   INR 1.18 12/09/2017    Assessment/Plan:  Acute saddle pulmonary embolism with acute cor pulmonale (HCC) -Discussed various causes of PE -Reviewed duration of therapy -Patient encouraged to continue physical therapy -Patient congratulated she quit smoking after the diagnosis - Plan: apixaban (ELIQUIS) 5 MG TABS tablet, Basic metabolic panel  HTN -Controlled this visit -Continue Coreg 25  mg twice daily -Patient encouraged to check BP at home and keep a log.  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  Ordered labs this visit however patient states she is not likely to get them 2/2 cost.  Grier Mitts, MD

## 2018-02-18 NOTE — Patient Instructions (Signed)
Pulmonary Embolism A pulmonary embolism (PE) is a sudden blockage or decrease of blood flow in one lung or both lungs. Most blockages come from a blood clot that forms in a lower leg, thigh, or arm vein (deep vein thrombosis, DVT) and travels to the lungs. A clot is blood that has thickened into a gel or solid. PE is a dangerous and life-threatening condition that needs to be treated right away. What are the causes? This condition is usually caused by a blood clot that forms in a vein and moves to the lungs. In rare cases, it may be caused by air, fat, part of a tumor, or other tissue that moves through the veins and into the lungs. What increases the risk? The following factors may make you more likely to develop this condition:  Having DVT or a history of DVT.  Being older than age 60.  Personal or family history of blood clots or blood clotting disease.  Major or lengthy surgery.  Orthopedic surgery, especially hip or knee replacement.  Traumatic injury, such as breaking a hip or leg.  Spinal cord injury.  Stroke.  Taking medicines that contain estrogen. These include birth control pills and hormone replacement therapy.  Long-term (chronic) lung or heart disease.  Cancer and chemotherapy.  Having a central venous catheter.  Pregnancy and the period after delivery.  What are the signs or symptoms? Symptoms of this condition usually start suddenly and include:  Shortness of breath while active or at rest.  Coughing or coughing up blood or blood-tinged mucus.  Chest pain that is often worse with deep breaths.  Rapid or irregular heartbeat.  Feeling light-headed or dizzy.  Fainting.  Feeling anxious.  Sweating.  Pain and swelling in a leg. This is a symptom of DVT, which can lead to PE.  How is this diagnosed? This condition may be diagnosed based on:  Your medical history.  A physical exam.  Blood tests to check blood oxygen level and how well your blood  clots, and a D-dimer blood test, which checks your blood for a substance that is released when a blood clot breaks apart.  CT pulmonary angiogram. This test checks blood flow in and around your lungs.  Ventilation-perfusion scan, also called a lung VQ scan. This test measures air flow and blood flow to the lungs.  Ultrasound of the legs to look for blood clots.  How is this treated? Treatment for this conditions depends on many factors, such as the cause of your PE, your risk for bleeding or developing more clots, and other medical conditions you have. Treatment aims to remove, dissolve, or stop blood clots from forming or growing larger. Treatment may include:  Blood thinning medicines (anticoagulants) to stop clots from forming or growing. These medicines may be given as a pill, as an injection, or through an IV tube (infusion).  Medicines that dissolve clots (thrombolytics).  A procedure in which a flexible tube is used to remove a blood clot (embolectomy) or deliver medicine to destroy it (catheter-directed thrombolysis).  A procedure in which a filter is inserted into a large vein that carries blood to the heart (inferior vena cava). This filter (vena cava filter) catches blood clots before they reach the lungs.  Surgery to remove the clot (surgical embolectomy). This is rare.  You may need a combination of immediate, long-term (up to 3 months after diagnosis), and extended (more than 3 months after diagnosis) treatments. Your treatment may continue for several months (maintenance therapy).   You and your health care provider will work together to choose the treatment program that is best for you. Follow these instructions at home: If you are taking an anticoagulant medicine:  Take the medicine every day at the same time each day.  Understand what foods and drugs interact with your medicine.  Understand the side effects of this medicine, including excessive bruising or bleeding. Ask  your health care provider or pharmacist about other side effects. General instructions  Take over-the-counter and prescription medicines only as told by your health care provider.  Anticoagulant medicines may cause side effects, including easy bruising and difficulty stopping bleeding. If you are prescribed an anticoagulant: ? Hold pressure over cuts for longer than usual. ? Tell your dentist and other health care providers that you are taking anticoagulants before you have any procedure that may cause bleeding. ? Avoid contact sports. ? Be extra careful when handling sharp objects. ? Use a soft toothbrush. Floss with waxed dental floss. ? Shave with an electric razor.  Wear a medical alert bracelet or carry a medical alert card that says you have had a PE.  Ask your health care provider when you may return to your normal activities.  Talk with your health care provider about any travel plans. It is important to make sure that you are still able to take your medicine while on trips.  Keep all follow-up visits as told by your health care provider. This is important. How is this prevented? Take these actions to lower your risk of developing another PE:  Exercise regularly. Take frequent walks. For at least 30 minutes every day, engage in: ? Activity that involves moving your arms and legs. ? Activity that encourages good blood flow through your body by increasing your heart rate.  While traveling, drink plenty of water and avoid drinking alcohol. Ask your health care provider if you should wear below-the-knee compression stockings.  Avoid sitting or lying in bed for long periods of time without moving your legs. Exercise your arms and legs every hour during long-distance travel (over 4 hours).  If you are hospitalized or have surgery, ask your health care provider about your risks and what treatments can help prevent blood clots.  Maintain a healthy weight. Ask your health care  provider what weight is healthy for you.  If you are a woman who is over age 35, avoid unnecessary use of medicines that contain estrogen, including birth control pills.  Do not use any products that contain nicotine or tobacco, such as cigarettes and e-cigarettes. This is especially important if you take estrogen medicines. If you need help quitting, ask your health care provider.  See your health care provider for regular checkups. This may include blood tests and ultrasound testing on your legs to check for new blood clots.  Contact a health care provider if:  You missed a dose of your blood thinner medicine. Get help right away if:  You have new or increased pain, swelling, warmth, or redness in an arm or leg.  You have numbness or tingling in an arm or leg.  You have shortness of breath while active or at rest.  You have chest pain.  You have a rapid or irregular heartbeat.  You feel light-headed or dizzy.  You cough up blood.  You have blood in your vomit, stool, or urine.  You have a fever.  You have abdomen (abdominal) pain.  You have a severe fall or head injury.  You have a   severe headache.  You have vision changes.  You cannot move your arms or legs.  You are confused or have memory loss.  You are bleeding for 10 minutes or more, even with strong pressure on the wound. These symptoms may represent a serious problem that is an emergency. Do not wait to see if the symptoms will go away. Get medical help right away. Call your local emergency services (911 in the [place] Do not drive yourself to the hospital. Summary  A pulmonary embolism (PE) is a sudden blockage or decrease of blood flow in one lung or both lungs. PE is a dangerous and life-threatening condition that needs to be treated right away.  Having deep vein thrombosis (DVT) or a history of DVT is the most common risk factor for PE.  Treatments for this condition usually include medicines to thin  your blood (anticoagulants) or medicines to break apart blood clots (thrombolytics).  If you are prescribed blood thinners, it is important to take the medicine every single day at the same time each day.  If you have signs of PE or DVT, call your local emergency services (911 in the [place] This information is not intended to replace advice given to you by your health care provider. Make sure you discuss any questions you have with your health care provider. Document Released: 09/29/2000 Document Revised: 11/04/2016 Document Reviewed: 11/04/2016 Elsevier Interactive Patient Education  2018 Elsevier Inc.  

## 2018-07-12 ENCOUNTER — Other Ambulatory Visit: Payer: Self-pay | Admitting: Family Medicine

## 2018-07-12 DIAGNOSIS — I2602 Saddle embolus of pulmonary artery with acute cor pulmonale: Secondary | ICD-10-CM

## 2018-09-30 ENCOUNTER — Other Ambulatory Visit: Payer: Self-pay

## 2018-09-30 MED ORDER — APIXABAN 5 MG PO TABS: 10.0000 mg | ORAL_TABLET | Freq: Two times a day (BID) | ORAL | 3 refills | Status: DC

## 2018-10-01 ENCOUNTER — Other Ambulatory Visit: Payer: Self-pay

## 2018-10-01 ENCOUNTER — Telehealth: Payer: Self-pay

## 2018-10-01 NOTE — Telephone Encounter (Signed)
Copied from [place] [id number]. Topic: Quick Communication - Rx Refill/Question >> Oct 01, 2018  2:31 PM Alanda Slim E wrote: Medication: apixaban (ELIQUIS) 5 MG TABS tablet  Pharmacy called to get clarity on the ABOVE medication. They stated that the Pt normally takes 1 tablet a day and that 2 tablets was a bit high of a dose. / please advise  Preferred Pharmacy (with phone number or street name): [id number] - [place], [place] [phone] (Phone) [phone] (Fax)    Agent: Please be advised that RX refills may take up to 3 business days. We ask that you follow-up with your pharmacy.

## 2018-10-01 NOTE — Telephone Encounter (Signed)
Spoke with pharmacy voiced understanding of the Rx Eliquis clarification

## 2018-11-13 ENCOUNTER — Ambulatory Visit: Payer: BC Managed Care – PPO | Admitting: Family Medicine

## 2018-11-13 ENCOUNTER — Encounter: Payer: Self-pay | Admitting: Family Medicine

## 2018-11-13 VITALS — BP 122/80 | HR 82 | Temp 97.5°F | Wt 217.0 lb

## 2018-11-13 DIAGNOSIS — Z86711 Personal history of pulmonary embolism: Secondary | ICD-10-CM

## 2018-11-13 DIAGNOSIS — M25561 Pain in right knee: Secondary | ICD-10-CM

## 2018-11-13 DIAGNOSIS — G8929 Other chronic pain: Secondary | ICD-10-CM

## 2018-11-13 NOTE — Progress Notes (Signed)
Subjective:    Patient ID: Kristi Meyers, female    DOB: November 25, 1955, 63 y.o.   MRN: [id number]  No chief complaint on file.   HPI Patient was seen today for f/u.  Pt inquires if she still has blood clots.  Pt states she needs R TKR, but cannot have the procedure done if she has clots.  Pt seen at [facility], has appt tomorrow.  Pt has a h/o Saddle PE, PE in R and L pulmonary arteries and DVT R leg in 2/24-12/15/17.  Pt currently on Eliquis 5 mg daily.  Pt denies SOB, CP, calf tenderness, dizziness, fatigue.  Pt notes occasional edema in b/l ankles, especially when rides in the car.  Pt has edema in knees from time to time.  Past Medical History:  Diagnosis Date  . GERD (gastroesophageal reflux disease)   . High cholesterol   . Hypertension   . Vertigo     No Known Allergies  ROS General: Denies fever, chills, night sweats, changes in weight, changes in appetite HEENT: Denies headaches, ear pain, changes in vision, rhinorrhea, sore throat CV: Denies CP, palpitations, SOB, orthopnea Pulm: Denies SOB, cough, wheezing GI: Denies abdominal pain, nausea, vomiting, diarrhea, constipation GU: Denies dysuria, hematuria, frequency, vaginal discharge Msk: Denies muscle cramps, joint pains  + right knee pain and edema  neuro: Denies weakness, numbness, tingling Skin: Denies rashes, bruising Psych: Denies depression, anxiety, hallucinations    Objective:    Blood pressure 122/80, pulse 82, temperature (!) 97.5 F (36.4 C), temperature source Oral, weight 217 lb (98.4 kg), last menstrual period 11/17/2008, SpO2 97 %.   Gen. Pleasant, well-nourished, in no distress, normal affect  HEENT: [place]/AT, face symmetric, no scleral icterus, PERRLA, nares patent without drainage, pharynx without erythema or exudate. Lungs: no accessory muscle use, CTAB, no wheezes or rales Cardiovascular: RRR, no m/r/g, no peripheral edema. Musculoskeletal: No calf tenderness b/l.  Negative Homans sign.  No  deformities, no cyanosis or clubbing, normal tone Neuro:  A&Ox3, CN II-XII intact, slowed gait, ambulating with cane favoring RLE.  Wt Readings from Last 3 Encounters:  11/13/18 217 lb (98.4 kg)  02/18/18 196 lb 9.6 oz (89.2 kg)  12/15/17 246 lb 4.1 oz (111.7 kg)    Lab Results  Component Value Date   WBC 14.4 (H) 12/15/2017   HGB 8.1 (L) 12/15/2017   HCT 25.2 (L) 12/15/2017   PLT 327 12/15/2017   GLUCOSE 88 12/15/2017   ALT 17 12/14/2017   AST 17 12/14/2017   NA 139 12/15/2017   K 3.3 (L) 12/15/2017   CL 107 12/15/2017   CREATININE 0.74 12/15/2017   BUN 10 12/15/2017   CO2 24 12/15/2017   TSH 1.187 12/09/2017   INR 1.18 12/09/2017    Assessment/Plan:  History of pulmonary embolus (PE) -VS and exam reassuring as no lower extremity edema or tachycardia noted. -Pt encouraged to continue Eliquis 5 mg daily -Discussed wearing TED hose.  Patient to pick up a pair, given info. -Patient given handout -Given RTC or ED precautions  Chronic pain of right knee -Encouraged to keep follow-up appointment tomorrow with [facility]  Follow-up PRN  Grier Mitts, MD

## 2018-11-13 NOTE — Patient Instructions (Signed)
Venous Thromboembolism Prevention  Venous thromboembolism (VTE) is a condition in which a blood clot (thrombus) develops in the body. A thrombus usually occurs in a deep vein in the leg or the pelvis (DVT), but it can also occur in the arm. Sometimes, pieces of a thrombus can break off from its original place of development and travel through the bloodstream to other parts of the body. When that happens, the thrombus is called an embolus. An embolus that travels to one or both lungs is called a pulmonary embolism. An embolism can block the blood flow in the blood vessels of other organs as well.  VTE is a serious health condition that can cause disability or death. It is very important to get help right away and to not ignore symptoms.  How can a VTE be prevented?    · Exercise regularly. Take a brisk 30 minute walk every day. Staying active and moving around can help you to prevent blood clots.  · Avoid sitting or lying in bed for long periods of time without moving your legs. Change your position often, especially during long-distance travel (over 4 hours).  · If you are a woman who is over 35 years of age, avoid unnecessary use of medicines that contain estrogen. These include birth control pills and hormone replacement therapy.  · Do not smoke, especially if you take estrogen medicines. If you need help quitting, ask your health care provider.  · Eat plenty of fruits and vegetables. Ask your health care provider or dietitian if there are foods that you should avoid.  · Maintain a weight that is appropriate for your height. Ask your health care provider what weight is healthy for you.  · Wear loose-fitting clothing. Avoid constrictive or tight clothing around your legs or waist.  · Try not to bump or injure your legs. Avoid crossing your legs when you are sitting.  · Do not use pillows under your knees while lying down unless told by your health care provider.  · Wear support hose (compression stockings or TED  hose) as told by your health care provider. Compression stockings increase blood flow in your legs and can help prevent blood clots. Do not let them bunch up when you are wearing them.  How can I prevent VTE when I travel?  Long-distance travel (over 4 hours) can increase the risk of a VTE. To prevent VTE when traveling:  · Exercise your legs every hour by standing, stretching, and bending and straightening your legs. If you are traveling by airplane, train, or bus, walk up and down the aisle as often as possible to get your blood moving. If you are traveling by car, stop and get out of the car every hour to exercise your legs and stretch. Other types of exercise might include:  ? Keeping your feet flat on the ground and raising your toes.  ? Switching from tightening the muscles in your calves and thighs to relaxing those same muscles while you are sitting.  ? Pointing and flexing your feet at the ankle joints while you are sitting.  · Stay well hydrated while traveling. Drink enough water to keep your urine clear or pale yellow.  · Avoid drinking alcohol during long travel.  Generally, it is not recommended that you take medicines to prevent DVT during routine travel.  How can VTE be prevented if I am hospitalized?  A VTE may be prevented by taking medicines that are prescribed to prevent blood clots (anticoagulants). You   can also help to prevent VTE while in the hospital by taking these actions:  · Get out of bed and walk. Ask your health care provider if this is safe for you to do.  · Request the use of a sequential compression device (SCD). This is a machine that pumps air into compression sleeves that are wrapped around your legs.  · Request the use of compression stockings, which are tight, elastic stockings that apply pressure to the lower legs. Compression stockings are sometimes used with SCDs.  How can I prevent VTE after surgery?  Understand that there is an increased risk for VTE for the first 4-6 weeks  after surgery. During this time:  · Avoid long-distance travel (over 4 hours). If you must travel during this time, ask your health care provider about additional preventive actions that you can take. These might include exercising your arms and legs every hour while you travel.  · Avoid sitting or lying still for too long. If possible, get up and walk around one time every hour. Ask your health care provider when this is safe for you to do.  Get help right away if:  · You have new or increased pain, swelling, or redness in an arm or leg.  · You have numbness or tingling in an arm or leg.  · You have shortness of breath while active or at rest.  · You have chest pain.  · You have a rapid or irregular heartbeat.  · You feel light-headed or dizzy.  · You cough up blood.  · You notice blood in your vomit, bowel movement, or urine.  These symptoms may represent a serious problem that is an emergency. Do not wait to see if the symptoms will go away. Get medical help right away. Call your local emergency services (911 in the [place] Do not drive yourself to the hospital.  This information is not intended to replace advice given to you by your health care provider. Make sure you discuss any questions you have with your health care provider.  Document Released: 09/20/2009 Document Revised: 05/18/2017 Document Reviewed: 01/27/2015  Elsevier Interactive Patient Education © 2019 [employer]

## 2019-01-09 IMAGING — CT CT ANGIO CHEST
2 of 6 series · 17 of 46 positions shown · IV contrast (isovue)
Comparison: Chest radiograph dated 12/09/2017

ADDENDUM:
Positive for acute PE with CTevidence of right heart strain (RV/LV
Ratio = 1.74) consistent with at least submassive (intermediate
risk) PE. The presence of right heart strain has been associated
with an increased risk of morbidity and mortality.

Positive for acute PE with CT evidence of right heart strain (RV/LV
with an increased risk of morbidity and mortality. Please activate
Code PE by paging [phone].
CLINICAL DATA: 62-year-old female with shortness of breath.
Generalized weakness and fatigue.
EXAM:
CT ANGIOGRAPHY CHEST WITH CONTRAST
TECHNIQUE: Multidetector CT imaging of the chest was performed using the
standard protocol during bolus administration of intravenous
contrast. Multiplanar CT image reconstructions and MIPs were
obtained to evaluate the vascular anatomy.
CONTRAST:  100 cc Isovue 370

[Series 5: thins · axial · 0.62mm/px · z∈[+1401,+1624]mm · 14 of 245 slices shown]
[im 11/245  lung]
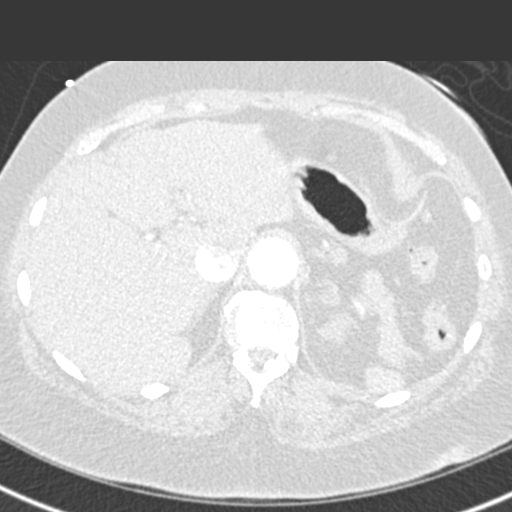
[im 32/245  soft-tissue]
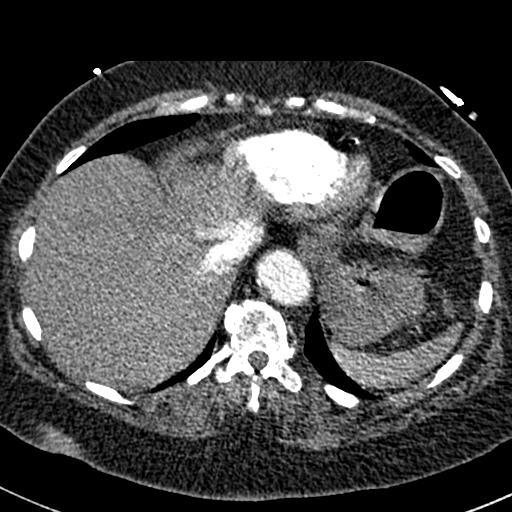
[im 43/245  lung]
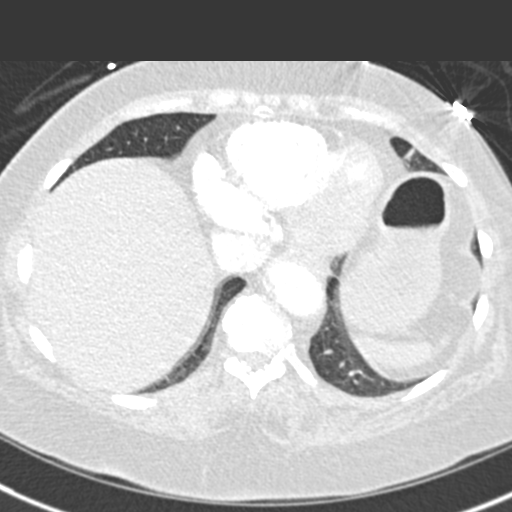
[im 64/245  soft-tissue]
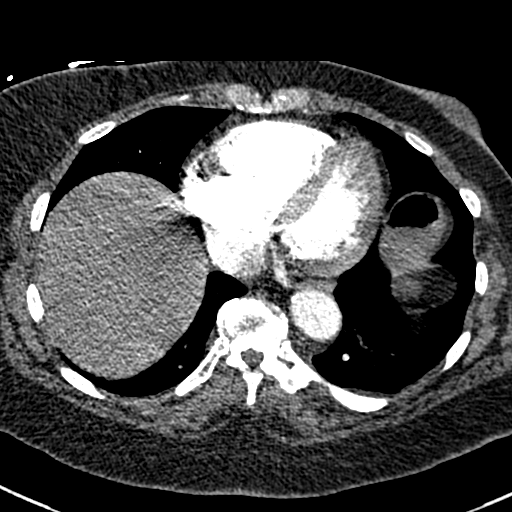
[im 85/245  lung]
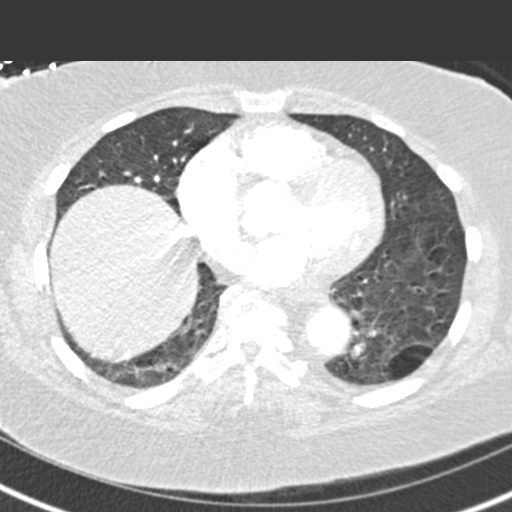
[im 96/245  soft-tissue]
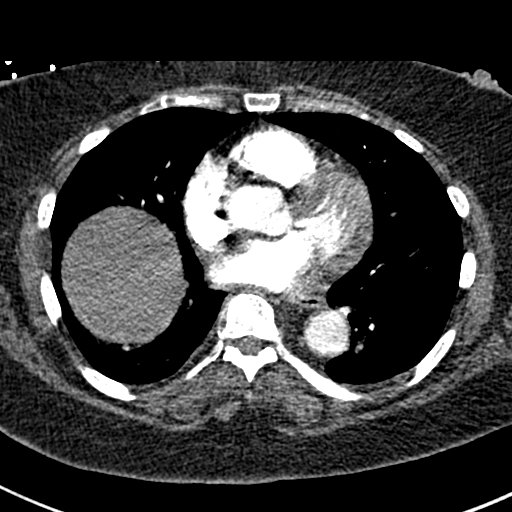
[im 117/245  lung]
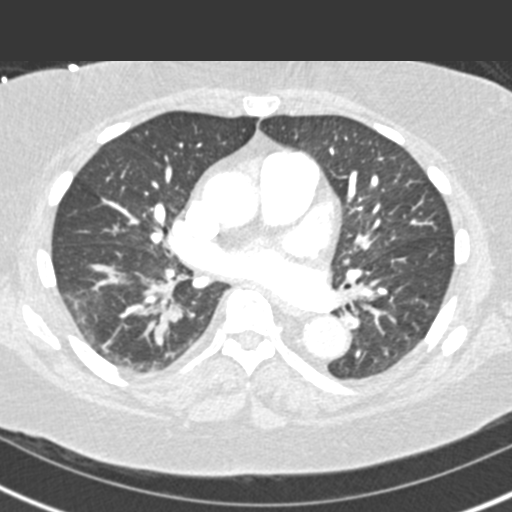
[im 128/245  soft-tissue]
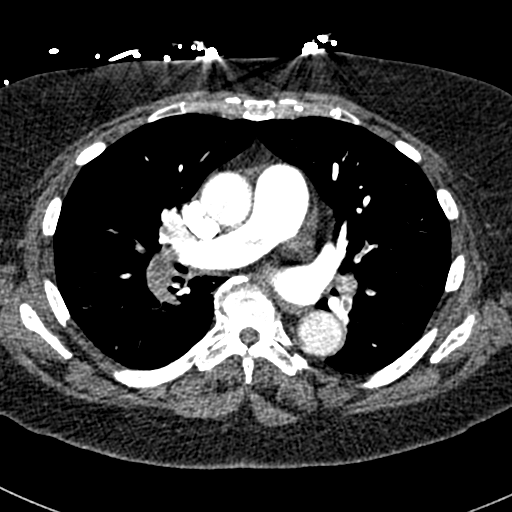
[im 149/245  lung]
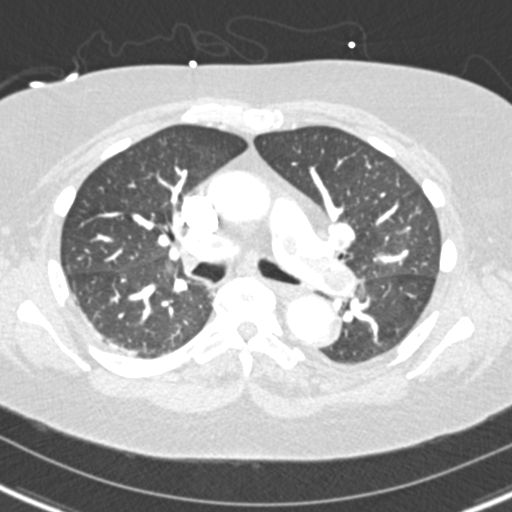
[im 160/245  soft-tissue]
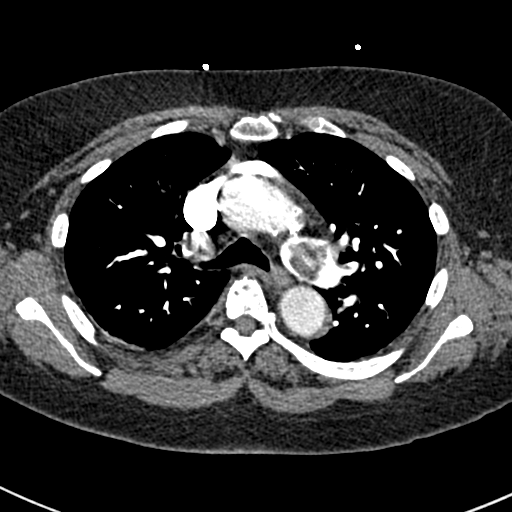
[im 181/245  lung]
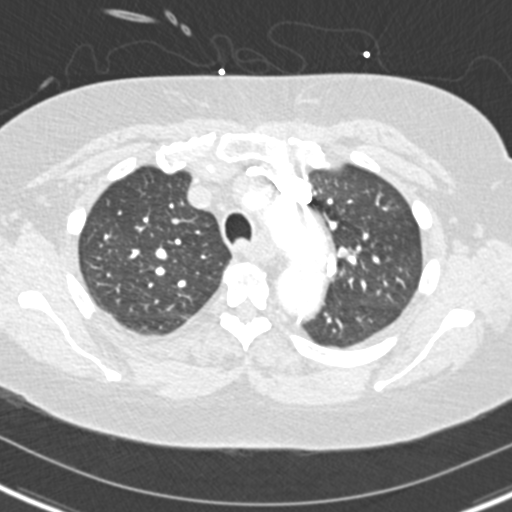
[im 202/245  soft-tissue]
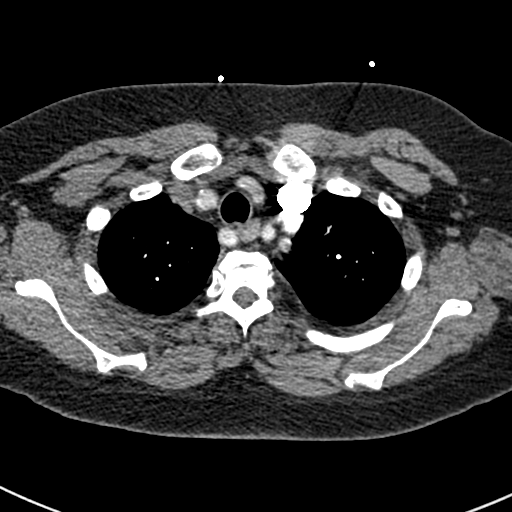
[im 213/245  lung]
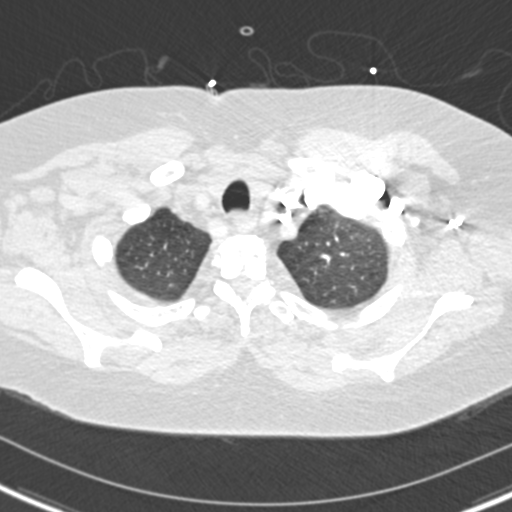
[im 234/245  soft-tissue]
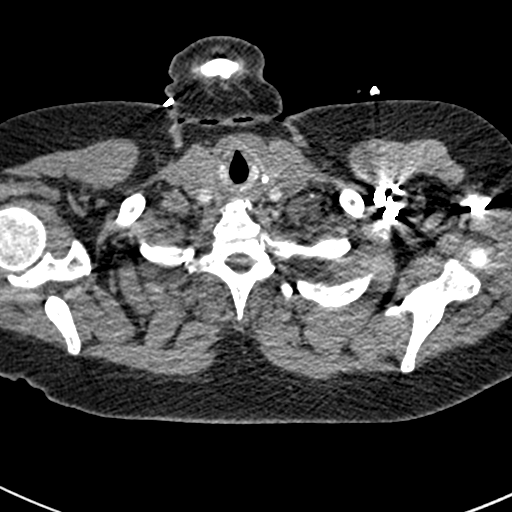

[Series 6: coronal mpr · coronal · 0.51mm/px · 3 of 121 slices shown]
[im 31/121  soft-tissue]
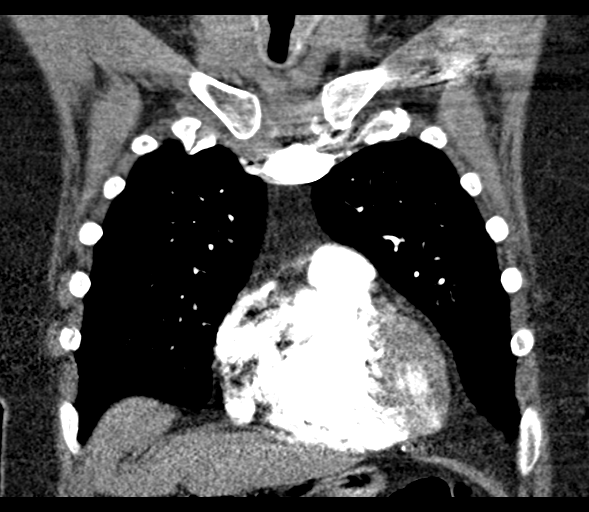
[im 61/121  soft-tissue]
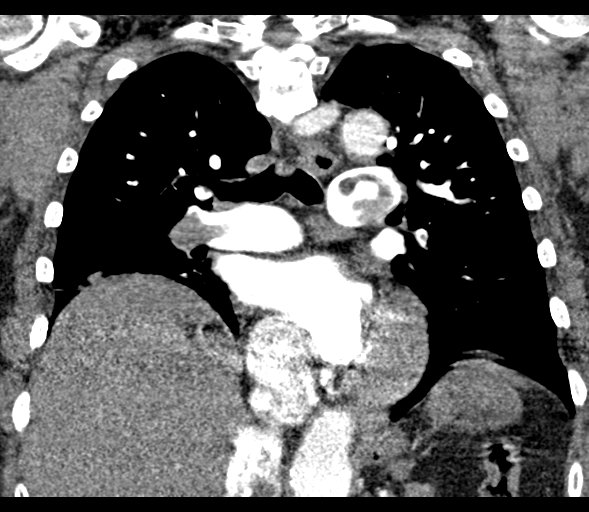
[im 91/121  soft-tissue]
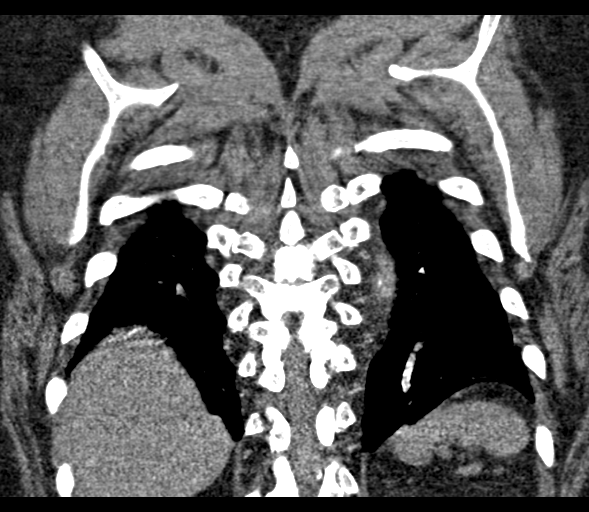

[17 of 46 positions shown; findings below may reference images not displayed]

FINDINGS: Cardiovascular: There is no cardiomegaly or pericardial effusion.
There is dilatation of the right ventricle with bowing of the
intraventricular septum to the left. The right ventricle measures
5.4 cm in diameter and the left ventricle measures 3.1 cm in
diameter. Findings consistent with right ventricular strain. There
is moderate calcified and noncalcified plaque of the thoracic aorta.
There is a large saddle pulmonary radiology embolus straddling the
bifurcation of the main pulmonary trunk and extending into the
bilateral pulmonary arteries. There is occlusion of the lobar and
segmental arteries of the lower lobes. There is dilatation of the
main pulmonary trunk and central pulmonary arteries.

Mediastinum/Nodes: There is no hilar or mediastinal adenopathy.
Esophagus and the thyroid gland are grossly unremarkable. No
mediastinal fluid collection.

Lungs/Pleura: There is a 3 cm subpleural bleb in the left lung base.
The lungs are otherwise clear. There is no pleural effusion or
pneumothorax. The central airways are patent.

Upper Abdomen: There is filling defect within the visualized
intrahepatic IVC (series 5, image 245). This area is only partially
visualized and not well evaluated. Although this may be related to
mixing artifact findings is concerning for IVC thrombus. Further
evaluation with CT of the abdomen pelvis with IV contrast and
delayed images in the venous phase recommended. Cholecystectomy.

Musculoskeletal: Degenerative changes of the spine. No acute osseous
pathology.

Review of the MIP images confirms the above findings.
IMPRESSION: 1. Large occlusive saddle embolus with findings of right heart
straining.
2. Partially visualized filling defect in the intrahepatic IVC
concerning for thrombus. Further evaluation with CT of the abdomen
and pelvis with IV contrast in venous phase recommended.

These results were called by telephone at the time of interpretation
on 12/10/2017 at [DATE] to Dr. ZELIR MALHAS , who verbally
acknowledged these results.

## 2019-01-11 IMAGING — DX DG CHEST 1V PORT
1 series · 1 of 1 positions shown · non-contrast
Comparison: Chest radiographs 12/09/2017 and CTA 12/10/2017

CLINICAL DATA: Respiratory distress.

EXAM:
PORTABLE CHEST 1 VIEW

[chest ap]
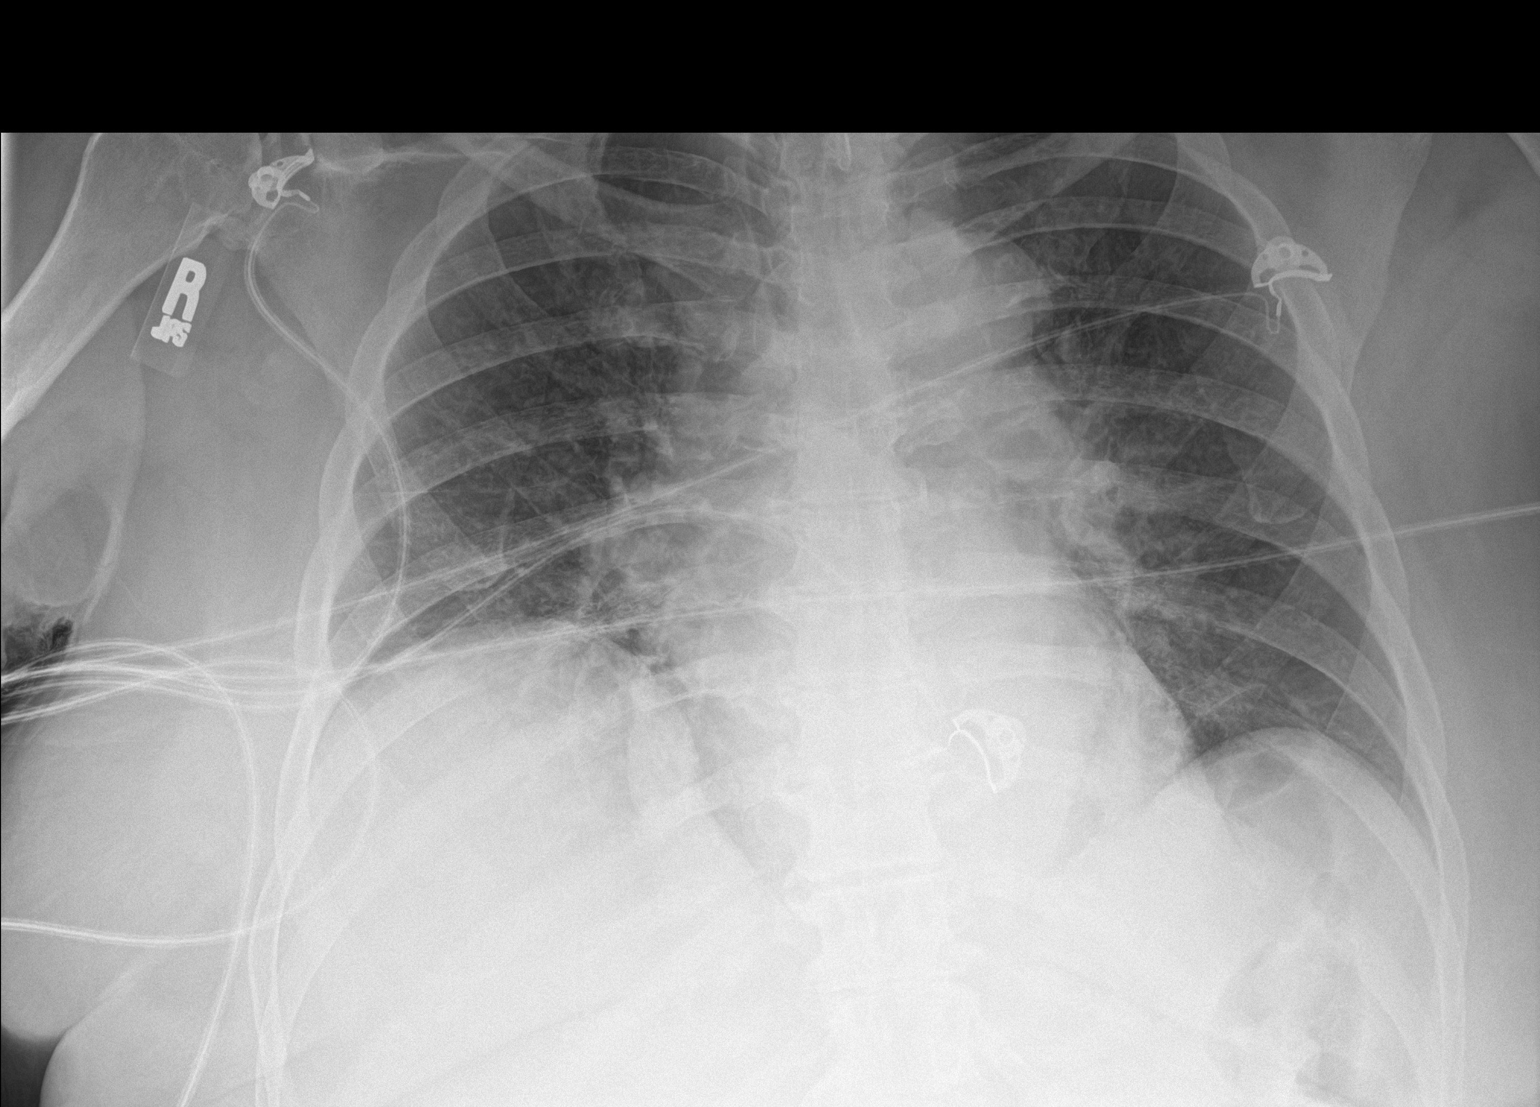

[1 of 1 positions shown; findings below may reference images not displayed]

FINDINGS: The cardiac silhouette is mildly enlarged. The lungs are
hypoinflated with new central pulmonary vascular congestion. There
is no overt edema. No sizable pleural effusion or pneumothorax is
identified. Thoracic spondylosis is noted.
IMPRESSION: Hypoinflation with pulmonary vascular congestion.

## 2019-01-27 ENCOUNTER — Telehealth: Payer: Self-pay

## 2019-01-27 NOTE — Telephone Encounter (Signed)
Pt surgical clearance form and office noted faxed to [facility] on 01/27/2019, confirmation received

## 2019-03-14 DIAGNOSIS — M1711 Unilateral primary osteoarthritis, right knee: Secondary | ICD-10-CM

## 2019-05-05 ENCOUNTER — Other Ambulatory Visit (HOSPITAL_COMMUNITY): Payer: Self-pay | Admitting: Cardiology

## 2019-05-05 DIAGNOSIS — R079 Chest pain, unspecified: Secondary | ICD-10-CM

## 2019-05-08 ENCOUNTER — Encounter (HOSPITAL_COMMUNITY): Admission: RE | Admit: 2019-05-08 | Payer: BC Managed Care – PPO | Source: Ambulatory Visit

## 2019-05-14 ENCOUNTER — Ambulatory Visit (HOSPITAL_COMMUNITY): Payer: BC Managed Care – PPO

## 2019-05-15 ENCOUNTER — Other Ambulatory Visit: Payer: Self-pay | Admitting: Nurse Practitioner

## 2019-05-15 ENCOUNTER — Other Ambulatory Visit: Payer: Self-pay

## 2019-05-15 DIAGNOSIS — Z1231 Encounter for screening mammogram for malignant neoplasm of breast: Secondary | ICD-10-CM

## 2019-05-16 ENCOUNTER — Other Ambulatory Visit: Payer: Self-pay

## 2019-05-16 ENCOUNTER — Inpatient Hospital Stay: Admit: 2019-05-16 | Payer: BC Managed Care – PPO | Admitting: Orthopedic Surgery

## 2019-05-16 ENCOUNTER — Ambulatory Visit (HOSPITAL_COMMUNITY)
Admission: RE | Admit: 2019-05-16 | Discharge: 2019-05-16 | Disposition: A | Discharge status: Home / Self Care | Payer: Medicare Other | Source: Ambulatory Visit | Attending: Cardiology | Admitting: Cardiology

## 2019-05-16 DIAGNOSIS — R079 Chest pain, unspecified: Secondary | ICD-10-CM | POA: Diagnosis not present

## 2019-05-16 SURGERY — ARTHROPLASTY, KNEE, TOTAL
Anesthesia: Choice | Laterality: Right

## 2019-05-16 MED ORDER — TECHNETIUM TC 99M TETROFOSMIN IV KIT
30.0000 | PACK | Freq: Once | INTRAVENOUS | Status: AC | PRN
  Administered 2019-05-16: 30 via INTRAVENOUS

## 2019-05-16 MED ORDER — REGADENOSON 0.4 MG/5ML IV SOLN
INTRAVENOUS | Status: AC
  Filled 2019-05-16: qty 5

## 2019-05-16 MED ORDER — REGADENOSON 0.4 MG/5ML IV SOLN: 0.4000 mg | Freq: Once | INTRAVENOUS | Status: DC

## 2019-05-16 MED ORDER — TECHNETIUM TC 99M TETROFOSMIN IV KIT
10.0000 | PACK | Freq: Once | INTRAVENOUS | Status: AC | PRN
  Administered 2019-05-16: 13:00:00 10 via INTRAVENOUS

## 2019-05-16 MED ORDER — REGADENOSON 0.4 MG/5ML IV SOLN
0.4000 mg | Freq: Once | INTRAVENOUS | Status: AC
  Administered 2019-05-16: 11:00:00 0.4 mg via INTRAVENOUS

## 2019-05-16 NOTE — Progress Notes (Signed)
Tolerated stress test well 

## 2020-05-17 LAB — PROTIME-INR: INR: 3.1 — AB (ref 0.9–1.1)

## 2020-06-17 ENCOUNTER — Emergency Department (HOSPITAL_COMMUNITY): Payer: Medicare Other

## 2020-06-17 ENCOUNTER — Other Ambulatory Visit: Payer: Self-pay

## 2020-06-17 ENCOUNTER — Encounter (HOSPITAL_COMMUNITY): Payer: Self-pay | Admitting: Emergency Medicine

## 2020-06-17 ENCOUNTER — Emergency Department (HOSPITAL_COMMUNITY)
Admission: EM | Admit: 2020-06-17 | Discharge: 2020-06-18 | Disposition: A | Discharge status: Home / Self Care | Payer: Medicare Other | Attending: Emergency Medicine | Admitting: Emergency Medicine

## 2020-06-17 DIAGNOSIS — N179 Acute kidney failure, unspecified: Secondary | ICD-10-CM | POA: Insufficient documentation

## 2020-06-17 DIAGNOSIS — Z7901 Long term (current) use of anticoagulants: Secondary | ICD-10-CM | POA: Diagnosis not present

## 2020-06-17 DIAGNOSIS — I10 Essential (primary) hypertension: Secondary | ICD-10-CM | POA: Diagnosis not present

## 2020-06-17 DIAGNOSIS — R8271 Bacteriuria: Secondary | ICD-10-CM | POA: Insufficient documentation

## 2020-06-17 DIAGNOSIS — D219 Benign neoplasm of connective and other soft tissue, unspecified: Secondary | ICD-10-CM

## 2020-06-17 DIAGNOSIS — F17210 Nicotine dependence, cigarettes, uncomplicated: Secondary | ICD-10-CM | POA: Diagnosis not present

## 2020-06-17 DIAGNOSIS — D259 Leiomyoma of uterus, unspecified: Secondary | ICD-10-CM | POA: Insufficient documentation

## 2020-06-17 DIAGNOSIS — E875 Hyperkalemia: Secondary | ICD-10-CM | POA: Diagnosis not present

## 2020-06-17 DIAGNOSIS — R079 Chest pain, unspecified: Secondary | ICD-10-CM | POA: Diagnosis present

## 2020-06-17 DIAGNOSIS — R0789 Other chest pain: Secondary | ICD-10-CM | POA: Insufficient documentation

## 2020-06-17 DIAGNOSIS — N939 Abnormal uterine and vaginal bleeding, unspecified: Secondary | ICD-10-CM

## 2020-06-17 LAB — CBC WITH DIFFERENTIAL/PLATELET
Abs Immature Granulocytes: 0.05 10*3/uL (ref 0.00–0.07)
Basophils Absolute: 0.1 10*3/uL (ref 0.0–0.1)
Basophils Relative: 1 %
Eosinophils Absolute: 0.1 10*3/uL (ref 0.0–0.5)
Eosinophils Relative: 1 %
HCT: 30.8 % — ABNORMAL LOW (ref 36.0–46.0)
Hemoglobin: 9.3 g/dL — ABNORMAL LOW (ref 12.0–15.0)
Immature Granulocytes: 0 %
Lymphocytes Relative: 18 %
Lymphs Abs: 2.3 10*3/uL (ref 0.7–4.0)
MCH: 26.1 pg (ref 26.0–34.0)
MCHC: 30.2 g/dL (ref 30.0–36.0)
MCV: 86.3 fL (ref 80.0–100.0)
Monocytes Absolute: 0.9 10*3/uL (ref 0.1–1.0)
Monocytes Relative: 7 %
Neutro Abs: 9.4 10*3/uL — ABNORMAL HIGH (ref 1.7–7.7)
Neutrophils Relative %: 73 %
Platelets: 282 10*3/uL (ref 150–400)
RBC: 3.57 MIL/uL — ABNORMAL LOW (ref 3.87–5.11)
RDW: 17 % — ABNORMAL HIGH (ref 11.5–15.5)
WBC: 12.8 10*3/uL — ABNORMAL HIGH (ref 4.0–10.5)
nRBC: 0 % (ref 0.0–0.2)

## 2020-06-17 LAB — COMPREHENSIVE METABOLIC PANEL
ALT: 16 U/L (ref 0–44)
AST: 30 U/L (ref 15–41)
Albumin: 3 g/dL — ABNORMAL LOW (ref 3.5–5.0)
Alkaline Phosphatase: 56 U/L (ref 38–126)
Anion gap: 9 (ref 5–15)
BUN: 23 mg/dL (ref 8–23)
CO2: 21 mmol/L — ABNORMAL LOW (ref 22–32)
Calcium: 8.1 mg/dL — ABNORMAL LOW (ref 8.9–10.3)
Chloride: 109 mmol/L (ref 98–111)
Creatinine, Ser: 1.79 mg/dL — ABNORMAL HIGH (ref 0.44–1.00)
GFR calc Af Amer: 34 mL/min — ABNORMAL LOW (ref >60)
GFR calc non Af Amer: 29 mL/min — ABNORMAL LOW (ref >60)
Glucose, Bld: 120 mg/dL — ABNORMAL HIGH (ref 70–99)
Potassium: 5.5 mmol/L — ABNORMAL HIGH (ref 3.5–5.1)
Sodium: 139 mmol/L (ref 135–145)
Total Bilirubin: 2.1 mg/dL — ABNORMAL HIGH (ref 0.3–1.2)
Total Protein: 6.6 g/dL (ref 6.5–8.1)

## 2020-06-17 LAB — TROPONIN I (HIGH SENSITIVITY)
Troponin I (High Sensitivity): 16 ng/L (ref <18)
Troponin I (High Sensitivity): 13 ng/L (ref <18)

## 2020-06-17 LAB — TYPE AND SCREEN
ABO/RH(D): B POS
Antibody Screen: NEGATIVE

## 2020-06-17 MED ORDER — SODIUM ZIRCONIUM CYCLOSILICATE 5 G PO PACK
5.0000 g | PACK | Freq: Once | ORAL | Status: AC
  Administered 2020-06-18: 5 g via ORAL
  Filled 2020-06-17: qty 1

## 2020-06-17 MED ORDER — LACTATED RINGERS IV BOLUS
1000.0000 mL | Freq: Once | INTRAVENOUS | Status: AC
  Administered 2020-06-18: 1000 mL via INTRAVENOUS

## 2020-06-17 MED ORDER — SODIUM CHLORIDE 0.9 % IV BOLUS
1000.0000 mL | Freq: Once | INTRAVENOUS | Status: AC
  Administered 2020-06-17: 1000 mL via INTRAVENOUS

## 2020-06-17 MED ORDER — SODIUM CHLORIDE 0.9 % IV BOLUS
500.0000 mL | Freq: Once | INTRAVENOUS | Status: AC
  Administered 2020-06-17: 500 mL via INTRAVENOUS

## 2020-06-17 NOTE — Discharge Instructions (Addendum)
Please hold Eliquis for the next 5 days.  Stay hydrated.  Your kidney function is slightly abnormal and you need to repeat kidney function test in a week.  You have some small fibroids on your ultrasound. You will need to see GYN doctor for follow-up and you will likely need a Pap smear.  Return to ER if you have uncontrolled bleeding, severe chest pain, shortness of breath.

## 2020-06-17 NOTE — ED Triage Notes (Signed)
Pt BIB [facility]. Pt was walking up stairs and had sudden chest tightness. Pt was hypotensive on EMS arrival, initial 90/60. Pt also reports light vaginal bleeding for past couple months and heavy vaginal bleeding for past 5 days post menopause Reports she got the J&J vaccine in February. 324 ASA CBG 226 18 lAC 39mL NS given

## 2020-06-17 NOTE — ED Notes (Signed)
Please call [name] (pt daughter) at [phone]

## 2020-06-17 NOTE — ED Provider Notes (Signed)
[facility] Provider Note   CSN: [id number] Arrival date & time: 06/17/20  1940     History Chief Complaint  Patient presents with  . Chest Pain    Kristi Meyers is a 64 y.o. female history of high cholesterol and hypertension here presenting with chest pain.  Patient states that she has been having heavy vaginal bleeding for the last 4 days.  She states that she has been intermittently bleeding for the last several months.  She states that since this morning she has some chest pain walking up the stairs.  EMS was called and she was noted to be hypotensive with blood pressure of 90.  She received aspirin by EMS.  She also received [employer] vaccine in February.  She has no known cardiac history.  Patient had a massive PE about a year ago and is still taking Eliquis.   The history is provided by the patient.       Past Medical History:  Diagnosis Date  . GERD (gastroesophageal reflux disease)   . High cholesterol   . Hypertension   . Vertigo     Patient Active Problem List   Diagnosis Date Noted  . Primary localized osteoarthritis of right knee 03/14/2019  . Abdominal aortic aneurysm (AAA) 3.0 cm to 5.0 cm in diameter in female ([place]) 02/18/2018  . Shock ([place]) 12/15/2017  . Elevated troponin 12/15/2017  . AKI (acute kidney injury) ([place]) 12/15/2017  . E. coli UTI 12/15/2017  . Respiratory distress   . Acute saddle pulmonary embolus ([place]) 12/10/2017    Past Surgical History:  Procedure Laterality Date  . CHOLECYSTECTOMY    . IR ANGIOGRAM PULMONARY BILATERAL SELECTIVE  12/10/2017  . IR ANGIOGRAM SELECTIVE EACH ADDITIONAL VESSEL  12/10/2017  . IR ANGIOGRAM SELECTIVE EACH ADDITIONAL VESSEL  12/10/2017  . IR INFUSION THROMBOL ARTERIAL INITIAL (MS)  12/10/2017  . IR INFUSION THROMBOL ARTERIAL INITIAL (MS)  12/10/2017  . IR THROMB F/U EVAL ART/VEN FINAL DAY (MS)  12/11/2017  . IR US GUIDE VASC ACCESS RIGHT  12/10/2017     OB History    No obstetric history on file.     History reviewed. No pertinent family history.  Social History   Tobacco Use  . Smoking status: Current Some Day Smoker    Types: Cigarettes  . Smokeless tobacco: Never Used  Substance Use Topics  . Alcohol use: No  . Drug use: No    Home Medications Prior to Admission medications   Medication Sig Start Date End Date Taking? Authorizing Provider  AMLODIPINE BESYLATE PO Take 5 mg by mouth daily.    [provider]  atorvastatin (LIPITOR) 40 MG tablet Take 40 mg by mouth daily.    [provider]  carvedilol (COREG) 25 MG tablet Take 25 mg by mouth 2 (two) times daily with a meal.    [provider]  ELIQUIS 5 MG TABS tablet TAKE 1 TABLET BY MOUTH TWICE DAILY 07/15/18   Billie Ruddy, MD  losartan (COZAAR) 50 MG tablet Take 50 mg by mouth daily.    [provider]  meclizine (ANTIVERT) 12.5 MG tablet Take 12.5 mg by mouth 2 (two) times daily.    [provider]  POTASSIUM CHLORIDE ER PO Take 20 mg by mouth daily.    [provider]  traMADol (ULTRAM) 50 MG tablet Take 1 tablet (50 mg total) by mouth every 8 (eight) hours as needed for severe pain. 10/29/17  Jaynee Eagles, PA-C    Allergies    Patient has no known allergies.  Review of Systems   Review of Systems  Cardiovascular: Positive for chest pain.  All other systems reviewed and are negative.   Physical Exam Updated Vital Signs BP 93/61 (BP Location: Right Arm)   Pulse 72   Temp 97.7 F (36.5 C) (Oral)   Resp 18   Ht 5\' 2"  (1.575 m)   Wt 98.4 kg   LMP 11/17/2008 (Approximate)   SpO2 97%   BMI 39.68 kg/m   Physical Exam Vitals and nursing note reviewed.  Constitutional:      Comments: Pale   HENT:     Head: Normocephalic.  Eyes:     Pupils: Pupils are equal, round, and reactive to light.  Cardiovascular:     Rate and Rhythm: Normal rate and regular rhythm.     Heart sounds: Normal heart sounds.  Pulmonary:      Effort: Pulmonary effort is normal.  Abdominal:     General: Bowel sounds are normal.     Palpations: Abdomen is soft.  Genitourinary:    Comments: Pelvic- dry blood in the vault, no obvious active bleeding  Musculoskeletal:        General: Normal range of motion.     Cervical back: Normal range of motion.  Skin:    General: Skin is warm.     Capillary Refill: Capillary refill takes less than 2 seconds.  Neurological:     General: No focal deficit present.     Mental Status: She is oriented to person, place, and time.  Psychiatric:        Mood and Affect: Mood normal.        Behavior: Behavior normal.     ED Results / Procedures / Treatments   Labs (all labs ordered are listed, but only abnormal results are displayed) Labs Reviewed  CBC WITH DIFFERENTIAL/PLATELET - Abnormal; Notable for the following components:      Result Value   WBC 12.8 (*)    RBC 3.57 (*)    Hemoglobin 9.3 (*)    HCT 30.8 (*)    RDW 17.0 (*)    Neutro Abs 9.4 (*)    All other components within normal limits  COMPREHENSIVE METABOLIC PANEL - Abnormal; Notable for the following components:   Potassium 5.5 (*)    CO2 21 (*)    Glucose, Bld 120 (*)    Creatinine, Ser 1.79 (*)    Calcium 8.1 (*)    Albumin 3.0 (*)    Total Bilirubin 2.1 (*)    GFR calc non Af Amer 29 (*)    GFR calc Af Amer 34 (*)    All other components within normal limits  URINALYSIS, ROUTINE W REFLEX MICROSCOPIC  I-STAT CHEM 8, ED  TYPE AND SCREEN  ABO/RH  TROPONIN I (HIGH SENSITIVITY)  TROPONIN I (HIGH SENSITIVITY)    EKG EKG Interpretation  Date/Time:  Thursday June 17 2020 21:10:50 EDT Ventricular Rate:  67 PR Interval:    QRS Duration: 129 QT Interval:  414 QTC Calculation: 437 R Axis:   -48 Text Interpretation: Sinus rhythm Left bundle branch block No significant change since last tracing Confirmed by Wandra Arthurs [phone]) on 06/17/2020 9:36:20 PM   Radiology DG Chest 2 View  Result Date:  06/17/2020 CLINICAL DATA:  Left-sided chest pain, shortness of breath and dizziness today, history of pulmonary embolus EXAM: CHEST - 2 VIEW COMPARISON:  12/12/2017 FINDINGS: Frontal  and lateral views of the chest demonstrate an unremarkable cardiac silhouette. No airspace disease, effusion, or pneumothorax. No acute bony abnormalities. IMPRESSION: 1. No acute intrathoracic process. Electronically Signed   By: Randa Ngo M.D.   On: 06/17/2020 20:30   US PELVIC COMPLETE WITH TRANSVAGINAL  Result Date: 06/17/2020 CLINICAL DATA:  Vaginal bleeding, rule out mass EXAM: TRANSABDOMINAL AND TRANSVAGINAL ULTRASOUND OF PELVIS TECHNIQUE: Both transabdominal and transvaginal ultrasound examinations of the pelvis were performed. Transabdominal technique was performed for global imaging of the pelvis including uterus, ovaries, adnexal regions, and pelvic cul-de-sac. It was necessary to proceed with endovaginal exam following the transabdominal exam to visualize the uterus and adnexa. COMPARISON:  CT 12/10/2017 FINDINGS: Uterus Measurements: 6.7 x 3.9 x 4.9 cm = volume: 67.5 mL. There is a rounded, heterogeneous and predominantly hypoechoic structure in the anterior mid to lower uterine segment, most compatible with an intramural uterine leiomyoma/fibroid. Slight distortion of the endometrium is noted along the posterior margins of this mass. Endometrium Thickness: 4 mm, non thickened. Slight distortion of the endometrium in the vicinity of the intramural fibroid above. Small amount of low-attenuation fluid within the endocervical canal compatible with reported bleeding. Right ovary Nonvisualization of the right ovary. No visible abnormalities in the right adnexa. Left ovary Nonvisualization of the left ovary. No visible abnormalities in the left adnexa. Other findings No abnormal free fluid. IMPRESSION: 1. Small amount of low-attenuation fluid within the endocervical canal compatible with reported vaginal bleeding. 2.  Fibroid in the anterior mid to lower uterine segment with some mild distortion of the adjacent endometrial canal. 3. No other focal endometrial abnormality or significant endometrial thickening. 4. Nonvisualization of the ovaries. No visible abnormalities in the adnexal regions. Electronically Signed   By: Lovena Le M.D.   On: 06/17/2020 21:29    Procedures Procedures (including critical care time)  Medications Ordered in ED Medications  sodium chloride 0.9 % bolus 1,000 mL (1,000 mLs Intravenous New Bag/Given 06/17/20 2110)    ED Course  I have reviewed the triage vital signs and the nursing notes.  Pertinent labs & imaging results that were available during my care of the patient were reviewed by me and considered in my medical decision making (see chart for details).    MDM Rules/Calculators/A&P                          MADDELINE ROORDA is a 64 y.o. female here with vaginal bleeding and chest pain.  Vaginal bleeding for the last several months and worse over the last several days.  Patient appears pale.  I am suspecting that she may have symptomatic anemia from vaginal bleeding.  I am concerned the vaginal bleeding is likely secondary to malignancy.  Her last Pap smear is more than 5 years ago.  Plan to get CBC and CMP and type and screen and transvaginal ultrasound.  11:04 PM Patient has mild AKI with creatinine of 1.8. Patient's blood pressure is up to low 100s after IV fluids. Patient's hemoglobin is stable at 9. Patient's ultrasound showed fibroid and normal endometrial stripe. Trop neg x 1, second trop pending. Anticipate dc home if negative. Signed out to Dr. Dolly Rias in the ED. Anticipate dc home with GYN follow-up if Troponin is negative.   Final Clinical Impression(s) / ED Diagnoses Final diagnoses:  Vaginal bleeding    Rx / DC Orders ED Discharge Orders    None       Shirlyn Goltz  [place], MD 06/17/20 2328

## 2020-06-17 NOTE — ED Provider Notes (Signed)
11:52 PM Assumed care from Dr.  Darl Householder, please see their note for full history, physical and decision making until this point. In brief this is a 64 y.o. year old female who presented to the ED tonight with Chest Pain     Here with vaginal bleeding mostly. Stable from that standpoint. Has AKI with mildly elevated K, fluids given, thought to likely be improved. However also had some chest pain so is awaiting a second troponin for discharge/follow up.   Second troponin similar to first. UA still pending. Another liter of fluids provided.   In/out urine as below with likely contamination. Will treat with rocephin/keflex pending urine culture. pcp follow up for kidney, potassium.   Discharge instructions, including strict return precautions for new or worsening symptoms, given. Patient and/or family verbalized understanding and agreement with the plan as described.   Labs, studies and imaging reviewed by myself and considered in medical decision making if ordered. Imaging interpreted by radiology.  Labs Reviewed  CBC WITH DIFFERENTIAL/PLATELET - Abnormal; Notable for the following components:      Result Value   WBC 12.8 (*)    RBC 3.57 (*)    Hemoglobin 9.3 (*)    HCT 30.8 (*)    RDW 17.0 (*)    Neutro Abs 9.4 (*)    All other components within normal limits  COMPREHENSIVE METABOLIC PANEL - Abnormal; Notable for the following components:   Potassium 5.5 (*)    CO2 21 (*)    Glucose, Bld 120 (*)    Creatinine, Ser 1.79 (*)    Calcium 8.1 (*)    Albumin 3.0 (*)    Total Bilirubin 2.1 (*)    GFR calc non Af Amer 29 (*)    GFR calc Af Amer 34 (*)    All other components within normal limits  URINALYSIS, ROUTINE W REFLEX MICROSCOPIC  I-STAT CHEM 8, ED  TYPE AND SCREEN  ABO/RH  TROPONIN I (HIGH SENSITIVITY)  TROPONIN I (HIGH SENSITIVITY)    US PELVIC COMPLETE WITH TRANSVAGINAL  Final Result    DG Chest 2 View  Final Result      No follow-ups on file.    Jenifer Struve, Corene Cornea,  MD 06/18/20 [phone]

## 2020-06-18 LAB — URINALYSIS, ROUTINE W REFLEX MICROSCOPIC
RBC / HPF: >50 RBC/hpf — ABNORMAL HIGH (ref 0–5)
WBC, UA: >50 WBC/hpf — ABNORMAL HIGH (ref 0–5)

## 2020-06-18 MED ORDER — ACETAMINOPHEN 325 MG PO TABS
650.0000 mg | ORAL_TABLET | Freq: Once | ORAL | Status: AC
  Administered 2020-06-18: 650 mg via ORAL
  Filled 2020-06-18: qty 2

## 2020-06-18 MED ORDER — CEPHALEXIN 500 MG PO CAPS: 500.0000 mg | ORAL_CAPSULE | Freq: Four times a day (QID) | ORAL | 0 refills | Status: DC

## 2020-06-18 MED ORDER — SODIUM CHLORIDE 0.9 % IV SOLN
1.0000 g | Freq: Once | INTRAVENOUS | Status: AC
  Administered 2020-06-18: 1 g via INTRAVENOUS
  Filled 2020-06-18: qty 10

## 2020-06-18 NOTE — ED Notes (Signed)
E-signature pad unavailable at time of pt discharge. This RN discussed discharge materials with pt and answered all pt questions. Pt stated understanding of discharge material. ? ?

## 2020-06-20 LAB — URINE CULTURE: Culture: >=100000 — AB

## 2020-06-21 ENCOUNTER — Telehealth: Payer: Self-pay | Admitting: Emergency Medicine

## 2020-06-21 NOTE — Telephone Encounter (Signed)
Post ED Visit - Positive Culture Follow-up  Culture report reviewed by antimicrobial stewardship pharmacist: [place] Team []  Elenor Quinones, Pharm.D. []  Heide Guile, Pharm.D., BCPS AQ-ID []  Parks Neptune, Pharm.D., BCPS []  Alycia Rossetti, Pharm.D., BCPS []  [place], [place].D., BCPS, AAHIVP []  Legrand Como, Pharm.D., BCPS, AAHIVP []  Salome Arnt, PharmD, BCPS []  Johnnette Gourd, PharmD, BCPS []  Hughes Better, PharmD, BCPS []  Leeroy Cha, PharmD []  Laqueta Linden, PharmD, BCPS []  Albertina Parr, PharmD Kerby Nora PharmD  [place] Team []  Leodis Sias, PharmD []  Lindell Spar, PharmD []  Royetta Asal, PharmD []  Graylin Shiver, Rph []  Rema Fendt) Glennon Mac, PharmD []  Arlyn Dunning, PharmD []  Netta Cedars, PharmD []  Dia Sitter, PharmD []  Leone Haven, PharmD []  Gretta Arab, PharmD []  Theodis Shove, PharmD []  Peggyann Juba, PharmD []  Reuel Boom, PharmD   Positive urine culture Treated with cephalexin, organism sensitive to the same and no further patient follow-up is required at this time.  Hazle Nordmann 06/21/2020, 11:56 AM

## 2020-06-23 ENCOUNTER — Inpatient Hospital Stay: Payer: Medicare Other | Admitting: Family Medicine

## 2020-06-24 ENCOUNTER — Ambulatory Visit (INDEPENDENT_AMBULATORY_CARE_PROVIDER_SITE_OTHER): Payer: Medicare Other | Admitting: Family Medicine

## 2020-06-24 ENCOUNTER — Other Ambulatory Visit: Payer: Self-pay

## 2020-06-24 ENCOUNTER — Encounter: Payer: Self-pay | Admitting: Family Medicine

## 2020-06-24 VITALS — BP 118/68 | HR 87 | Temp 98.1°F | Wt 195.0 lb

## 2020-06-24 DIAGNOSIS — N950 Postmenopausal bleeding: Secondary | ICD-10-CM | POA: Diagnosis not present

## 2020-06-24 DIAGNOSIS — Z86711 Personal history of pulmonary embolism: Secondary | ICD-10-CM | POA: Diagnosis not present

## 2020-06-24 DIAGNOSIS — N179 Acute kidney failure, unspecified: Secondary | ICD-10-CM | POA: Diagnosis not present

## 2020-06-24 DIAGNOSIS — D219 Benign neoplasm of connective and other soft tissue, unspecified: Secondary | ICD-10-CM

## 2020-06-24 NOTE — Patient Instructions (Signed)
Postmenopausal Bleeding  Postmenopausal bleeding is any bleeding that occurs after menopause. Menopause is when a woman's period stops. Any type of bleeding after menopause should be checked by your doctor. Treatment will depend on the cause. Follow these instructions at home:  Pay attention to any changes in your symptoms.  Avoid using tampons and douches as told by your doctor.  Change your pads regularly.  Get regular pelvic exams and Pap tests.  Take iron pills as told by your doctor.  Take over-the-counter and prescription medicines only as told by your doctor.  Keep all follow-up visits as told by your doctor. This is important. Contact a doctor if:  Your bleeding lasts for more than 1 week.  You have pain in your belly (abdomen).  You have bleeding during or after sex.  You have bleeding that happens more often than every 3 weeks. Get help right away if:  You have fever, chills, headache, dizziness, muscle aches, or bleeding.  You have very bad pain with bleeding.  You have clumps of blood (blood clots) coming from your vagina.  You have a lot of bleeding, and: ? You use more than 1 pad an hour. ? This kind of bleeding has never happened before.  You feel like you are going to pass out (faint). Summary  Any type of bleeding after menopause should be checked by your doctor.  Pay attention to any changes in your symptoms.  Keep all follow-up visits as told by your doctor. This information is not intended to replace advice given to you by your health care provider. Make sure you discuss any questions you have with your health care provider. Document Revised: 12/19/2018 Document Reviewed: 11/07/2016 Elsevier Patient Education  2020 [place].  Acute Kidney Injury, Adult  Acute kidney injury is a sudden worsening of kidney function. The kidneys are organs that have several jobs. They filter the blood to remove waste products and extra fluid. They also  maintain a healthy balance of minerals and hormones in the body, which helps control blood pressure and keep bones strong. With this condition, your kidneys do not do their jobs as well as they should. This condition ranges from mild to severe. Over time it may develop into long-lasting (chronic) kidney disease. Early detection and treatment may prevent acute kidney injury from developing into a chronic condition. What are the causes? Common causes of this condition include:  A problem with blood flow to the kidneys. This may be caused by: ? Low blood pressure (hypotension) or shock. ? Blood loss. ? Heart and blood vessel (cardiovascular) disease. ? Severe burns. ? Liver disease.  Direct damage to the kidneys. This may be caused by: ? Certain medicines. ? A kidney infection. ? Poisoning. ? Being around or in contact with toxic substances. ? A surgical wound. ? A hard, direct hit to the kidney area.  A sudden blockage of urine flow. This may be caused by: ? Cancer. ? Kidney stones. ? An enlarged prostate in males. What are the signs or symptoms? Symptoms of this condition may not be obvious until the condition becomes severe. Symptoms of this condition can include:  Tiredness (lethargy), or difficulty staying awake.  Nausea or vomiting.  Swelling (edema) of the face, legs, ankles, or feet.  Problems with urination, such as: ? Abdominal pain, or pain along the side of your stomach (flank). ? Decreased urine production. ? Decrease in the force of urine flow.  Muscle twitches and cramps, especially in the legs.  Confusion or trouble concentrating.  Loss of appetite.  Fever. How is this diagnosed? This condition may be diagnosed with tests, including:  Blood tests.  Urine tests.  Imaging tests.  A test in which a sample of tissue is removed from the kidneys to be examined under a microscope (kidney biopsy). How is this treated? Treatment for this condition depends  on the cause and how severe the condition is. In mild cases, treatment may not be needed. The kidneys may heal on their own. In more severe cases, treatment will involve:  Treating the cause of the kidney injury. This may involve changing any medicines you are taking or adjusting your dosage.  Fluids. You may need specialized IV fluids to balance your body's needs.  Having a catheter placed to drain urine and prevent blockages.  Preventing problems from occurring. This may mean avoiding certain medicines or procedures that can cause further injury to the kidneys. In some cases treatment may also require:  A procedure to remove toxic wastes from the body (dialysis or continuous renal replacement therapy - CRRT).  Surgery. This may be done to repair a torn kidney, or to remove the blockage from the urinary system. Follow these instructions at home: Medicines  Take over-the-counter and prescription medicines only as told by your health care provider.  Do not take any new medicines without your health care provider's approval. Many medicines can worsen your kidney damage.  Do not take any vitamin and mineral supplements without your health care provider's approval. Many nutritional supplements can worsen your kidney damage. Lifestyle  If your health care provider prescribed changes to your diet, follow them. You may need to decrease the amount of protein you eat.  Achieve and maintain a healthy weight. If you need help with this, ask your health care provider.  Start or continue an exercise plan. Try to exercise at least 30 minutes a day, 5 days a week.  Do not use any tobacco products, such as cigarettes, chewing tobacco, and e-cigarettes. If you need help quitting, ask your health care provider. General instructions  Keep track of your blood pressure. Report changes in your blood pressure as told by your health care provider.  Stay up to date with immunizations. Ask your health care  provider which immunizations you need.  Keep all follow-up visits as told by your health care provider. This is important. Where to find more information  American Association of Kidney Patients: BombTimer.gl  National Kidney Foundation: www.kidney.[place]: https://mathis.com/  [facility]: ? www.lifeoptions.org ? www.kidneyschool.org Contact a health care provider if:  Your symptoms get worse.  You develop new symptoms. Get help right away if:  You develop symptoms of worsening kidney disease, which include: ? Headaches. ? Abnormally dark or light skin. ? Easy bruising. ? Frequent hiccups. ? Chest pain. ? Shortness of breath. ? End of menstruation in women. ? Seizures. ? Confusion or altered mental status. ? Abdominal or back pain. ? Itchiness.  You have a fever.  Your body is producing less urine.  You have pain or bleeding when you urinate. Summary  Acute kidney injury is a sudden worsening of kidney function.  Acute kidney injury can be caused by problems with blood flow to the kidneys, direct damage to the kidneys, and sudden blockage of urine flow.  Symptoms of this condition may not be obvious until it becomes severe. Symptoms may include edema, lethargy, confusion, nausea or vomiting, and problems passing urine.  This  condition can usually be diagnosed with blood tests, urine tests, and imaging tests. Sometimes a kidney biopsy is done to diagnose this condition.  Treatment for this condition often involves treating the underlying cause. It is treated with fluids, medicines, dialysis, diet changes, or surgery. This information is not intended to replace advice given to you by your health care provider. Make sure you discuss any questions you have with your health care provider. Document Revised: 09/14/2017 Document Reviewed: 09/22/2016 Elsevier Patient Education  2020 [place].  What You Need to Know About  Warfarin Warfarin is a blood thinner (anticoagulant). Anticoagulants help to prevent the formation of blood clots. They also help to stop the growth of blood clots. Who should use warfarin? Warfarin is prescribed for people who are at risk for developing harmful blood clots, such as people who have:  Surgically implanted mechanical heart valves.  Irregular heart rhythms (atrial fibrillation).  Certain clotting disorders.  A history of harmful blood clotting in the past. This includes people who have had: ? A stroke. ? Blood clot in the lungs (pulmonary embolism, or PE). ? Blood clot in the legs (deep vein thrombosis, or DVT).  An existing blood clot. How is warfarin taken?  Warfarin is a medicine that you take by mouth (orally). Warfarin tablets come in different strengths. Each tablet strength is a different color, with the amount of warfarin printed on the tablet. If you get a new prescription filled and the color of your tablet is different than usual, tell your pharmacist or health care provider immediately. What blood tests do I need while taking warfarin? The goal of warfarin therapy is to lessen the clotting tendency of blood, but not to prevent clotting completely. Your health care provider will monitor the anticoagulation effect of warfarin closely and will adjust your dose as needed. Warfarin is a medicine that needs to be closely monitored, so it is very important to keep all lab visits and follow-up visits with your health care provider. While taking warfarin, you will need to have blood tests (prothrombin tests, or PT tests) regularly to measure your blood clotting time. This type of test can be done with a finger stick or a blood draw. What does the INR test result mean? The PT test results will be reported as the International Normalized Ratio (INR). The INR tells your health care provider whether your dosage of warfarin needs to be changed. The longer it takes your blood to  clot, the higher the INR. Your health care provider will tell you your target INR range. If your INR is not in your target range, your health care provider may adjust your dosage.  If your INR is above your target range, there is a risk of bleeding. Your dosage of warfarin may need to be decreased.  If your INR is below your target range, there is a risk of clotting. Your dosage of warfarin may need to be increased. How often is the INR test needed?  When you first start warfarin, you will usually have your INR checked every few days.  You may need to have INR tests done more than once a week until you are taking the correct dosage of warfarin.  After you have reached your target INR, your INR will be tested less often. However, you will need to have your INR checked at least once every 4-6 weeks for the entire time you are taking warfarin. What are the side effects of warfarin? Too much warfarin can cause  bleeding (hemorrhage) in any part of the body, such as:  Bleeding from the gums.  Unexplained bruises.  Bruises that get larger.  Blood in the urine.  Bloody or dark stools.  Bleeding in the brain (hemorrhagic stroke).  A nosebleed that is not easily stopped.  Coughing up blood.  Vomiting blood. Warfarin use may also cause:  Skin rash or irritations  Nausea that does not go away.  Severe pain in the back or joints.  Painful toes that turn blue or purple (purple toe syndrome).  Painful ulcers that do not go away (skin necrosis). What are the signs and symptoms of a blood clot? Too little warfarin can increase the risk of blood clots in your legs, lungs, or arms. Signs and symptoms of a DVT in your leg or arm may include:  Pain or swelling in your leg or arm.  Skin that is red or warm to the touch on your arm or leg. Signs and symptoms of a pulmonary embolism may include:  Shortness of breath or difficulty breathing.  Chest pain.  Unexplained fever. What are  the signs and symptoms of a stroke? If you are taking too much or too little warfarin, you can have a stroke. Signs and symptoms of a stroke may include:  Weakness or numbness of your face, arm, or leg, especially on one side of your body.  Confusion or trouble thinking clearly.  Difficulty seeing with one or both eyes.  Difficulty walking or moving your arms or legs.  Dizziness.  Loss of balance or coordination.  Trouble speaking, trouble understanding speech, or both (aphasia).  Sudden, severe headache with no known cause.  Partial or total loss of consciousness. What precautions do I need to take while using warfarin?  Take warfarin exactly as told by your health care provider. Doing this helps you avoid bleeding or blood clots that could result in serious injury, pain, or disability.  Take your medicine at the same time every day. If you forget to take your dose of warfarin, take it as soon as you remember that day. If you do not remember on that day, do not take an extra dose the next day.  Contact your health care provider if you miss or take an extra dose. Do not change your dosage on your own to make up for missed or extra doses.  Wear or carry identification that says that you are taking warfarin.  Make sure that all health care providers, including your dentist, know you are taking warfarin.  If you need surgery, talk with your health care provider about whether you should stop taking warfarin before your surgery.  Avoid situations that cause bleeding. You may bleed more easily while taking warfarin. To limit bleeding, take the following actions: ? Use a softer toothbrush. ? Floss with waxed floss, not unwaxed floss. ? Shave with an electric razor, not with a blade. ? Limit your use of sharp objects. ? Avoid potentially harmful activities, such as contact sports. What do I need to know about warfarin and pregnancy or breastfeeding?  Warfarin is not recommended  during the first trimester of pregnancy due to an increased risk of birth defects. In certain situations, a woman may take warfarin after her first trimester of pregnancy.  If you are taking warfarin and you become pregnant or plan to become pregnant, contact your health care provider right away.  If you plan to breastfeed while taking warfarin, talk with your health care provider first. What do  I need to know about warfarin and alcohol or drug use?  Avoid drinking alcohol, or limit alcohol intake to no more than 1 drink a day for nonpregnant women and 2 drinks a day for men. One drink equals 12 oz of beer, 5 oz of wine, or 1 oz of hard liquor. ? If you change the amount of alcohol that you drink, tell your health care provider. Your warfarin dosage may need to be changed.  Avoid tobacco products, such as cigarettes, chewing tobacco, and e-cigarettes. If you need help quitting, ask your health care provider. ? If you change the amount of nicotine or tobacco that you use, tell your health care provider. Your warfarin dosage may need to be changed.  Avoid street drugs while taking warfarin. The effects of street drugs on warfarin are not known. What do I need to know about warfarin and other medicines or supplements?  Many prescription and over-the-counter medicines can interfere with warfarin. Talk with your health care provider or your pharmacist before starting or stopping any new medicines. This includes over-the-counter vitamins, dietary supplements, herbal medicines, and pain medicines. Your warfarin dosage may need to be adjusted.  Some common over-the-counter medicines that may increase the risk of bleeding while taking warfarin include: ? Acetaminophen. ? Aspirin. ? NSAIDs, such as ibuprofen or naproxen. ? Vitamin E. What do I need to know about warfarin and my diet?  It is important to maintain a normal, balanced diet while taking warfarin. Avoid major changes in your diet. If you  are going to change your diet, talk with your health care provider before making changes.  Your health care provider may recommend that you work with a diet and nutrition specialist (dietitian).  Vitamin K decreases the effect of warfarin, and it is found in many foods. Eat a consistent amount of foods that contain vitamin K. For example, you may decide to eat 2 vitamin K-containing foods each day. Most foods that are high in vitamin K are green and leafy. Common foods that contain high amounts of vitamin K include:  Kale, raw or cooked.  Spinach, raw or cooked.  Collards, raw or cooked.  Swiss chard, raw or cooked.  Mustard greens, raw or cooked.  Turnip greens, raw or cooked.  Parsley, raw.  Broccoli, cooked.  Noodles, eggs, and spinach, enriched.  Brussels sprouts, raw or cooked.  Beet greens, raw or cooked.  Endive, raw.  Cabbage, cooked.  Asparagus, cooked. Foods that contain moderate amounts of vitamin K include:  Broccoli, raw.  Cabbage, raw.  Bok choy, cooked.  Green leaf lettuce, raw  Prunes, stewed.  Angie Fava.  Kiwi.  Edamame, cooked.  Romaine lettuce, raw.  Avocado.  Tuna, canned in oil.  Okra, cooked.  Black-eyed peas, cooked.  Green beans, cooked or raw.  Blueberries, raw.  Blackberries, raw.  Peas, cooked or raw. Contact a health care provider if:  You miss a dose.  You take an extra dose.  You plan to have any kind of surgery or procedure.  You are unable to take your medicine due to nausea, vomiting, or diarrhea.  You have any major changes in your diet or you plan to make any major changes in your diet.  You start or stop any over-the-counter medicine, prescription medicine, or dietary supplement.  You become pregnant, plan to become pregnant, or think you may be pregnant.  You have menstrual periods that are heavier than usual.  You have unusual bruising. Get help right away if:  You develop symptoms of an  allergic reaction, such as: ? Swelling of the lips, face, tongue, mouth, or throat. ? Rash. ? Itching. ? Itchy, red, swollen areas of skin (hives). ? Trouble breathing. ? Chest tightness.  You have: ? Signs or symptoms of a stroke. ? Signs or symptoms of a blood clot. ? A fall or have an accident, especially if you hit your head. ? Blood in your urine. Your urine may look reddish, pinkish, or tea-colored. ? Blood in your stool. Your stool may be black or bright red. ? Bleeding that does not stop after applying pressure to the area for 30 minutes. ? Severe pain in your joints or back. ? Purple or blue toes. ? Skin ulcers that do not go away.  You vomit blood or cough up blood. The blood may be bright red, or it may look like coffee grounds. These symptoms may represent a serious problem that is an emergency. Do not wait to see if the symptoms will go away. Get medical help right away. Call your local emergency services (911 in the [place] Do not drive yourself to the hospital. Summary  Warfarin needs to be closely monitored with blood tests. It is very important to keep all lab visits and follow-up visits with your health care provider.  Make sure that you know your target INR range and your warfarin dosage.  Wear or carry identification that says that you are taking warfarin.  Take warfarin at the same time every day. Call your health care provider if you miss a dose or if you take an extra dose. Do not change the dosage of warfarin on your own.  Know the signs and symptoms of blood clots, bleeding, and a stroke. Know when to get emergency medical help.  Tell all health care providers who care for you that you are taking warfarin.  Talk with your health care provider or your pharmacist before starting or stopping any new medicines.  Monitor how much vitamin K you eat every day. Try to eat the same amount every day. This information is not intended to replace advice given to you  by your health care provider. Make sure you discuss any questions you have with your health care provider. Document Revised: 05/15/2017 Document Reviewed: 12/29/2015 Elsevier Patient Education  [place].  Bleeding Precautions When on Anticoagulant Therapy, Adult Anticoagulant therapy, also called blood thinner therapy, is medicine that helps to prevent and treat blood clots. The medicine works by stopping blood clots from forming or growing. Blood clots that form in your blood vessels can be dangerous. They can break loose and travel to the heart, lungs, or brain. This increases the risk of a heart attack, stroke, or blocked lung artery (pulmonary embolism). Anticoagulants also increase the risk of bleeding. Try to protect yourself from cuts and other injuries that can cause bleeding. It is important to take anticoagulants exactly as told by your health care provider. Why do I need to be on anticoagulant therapy? You may need this medicine if you are at risk of developing a blood clot. Conditions that increase your risk of a blood clot include:  Being born with heart disease or a heart malformation (congenital heart disease).  Developing heart disease.  Having had surgery, such as valve replacement.  Having had a serious accident or other type of severe injury (trauma).  Having certain types of cancer.  Having certain diseases that can increase blood clotting.  Having a high risk of stroke  or heart attack.  Having atrial fibrillation (AF). What are the common anticoagulant medicines? There are several types of anticoagulant medicines. The most common types are:  Medicines that you take by mouth (oral medicines), such as: ? Warfarin. ? Novel oral anticoagulants (NOACs), such as:  Direct thrombin inhibitors (dabigatran).  Factor Xa inhibitors (apixaban, edoxaban, and rivaroxaban).  Injections, such as: ? Unfractionated heparin. ? Low molecular weight heparin. These  anticoagulants work in different ways to prevent blood clots. They also have different risks and side effects. What do I need to remember while on anticoagulant therapy? Taking anticoagulants  Take your medicine at the same time every day. If you forget to take your medicine, take it as soon as you remember. Do not double your dosage of medicine if you miss a whole day. Take your normal dose and call your health care provider.  Do not stop taking your medicine unless your health care provider approves. Stopping the medicine can increase your risk of developing a blood clot. Taking other medicines  Take over-the-counter and prescriptions medicines only as told by your health care provider.  Do not take over-the-counter NSAIDs, including aspirin and ibuprofen, while you are on anticoagulant therapy. These medicines increase your risk of dangerous bleeding.  Get approval from your health care provider before you start taking any new medicines, vitamins, or herbal products. Some of these could interfere with your therapy. General instructions  Keep all follow-up visits as told by your health care provider. This is important.  If you are pregnant or trying to get pregnant, talk with a health care provider about anticoagulants. Some of these medicines are not safe to take during pregnancy.  Tell all health care providers, including your dentist, that you are on anticoagulant therapy. It is especially important to tell providers before you have any surgery, medical procedures, or dental work done. What precautions should I take?   Be very careful when using knives, scissors, or other sharp objects.  Use an electric razor instead of a blade.  Do not use toothpicks.  Use a soft-bristled toothbrush. Brush your teeth gently.  Always wear shoes outdoors and wear slippers indoors.  Be careful when cutting your fingernails and toenails.  Place bath mats in the bathroom. If possible, install  handrails as well.  Wear gloves while you do yard work.  Wear your seat belt.  Prevent falls by removing loose rugs and extension cords from areas where you walk. Use a cane or walker if you need it.  Avoid constipation by: ? Drinking enough fluid to keep your urine clear or pale yellow. ? Eating foods that are high in fiber, such as fresh fruits and vegetables, whole grains, and beans. ? Limiting foods that are high in fat and processed sugars, such as fried and sweet foods.  Do not play contact sports or participate in other activities that have a high risk for injury. What other precautions are important if on warfarin therapy? If you are taking a type of anticoagulant called warfarin, make sure you:  Work with a diet and nutrition specialist (dietitian) to make an eating plan. Do not make any sudden changes to your diet after you have started your eating plan.  Do not drink alcohol. It can interfere with your medicine and increase your risk of an injury that causes bleeding.  Get regular blood tests as told by your health care provider. What are some questions to ask my health care provider?  Why do I need  anticoagulant therapy?  What is the best anticoagulant therapy for my condition?  How long will I need anticoagulant therapy?  What are the side effects of anticoagulant therapy?  When should I take my medicine? What should I do if I forget to take it?  Will I need to have regular blood tests?  Do I need to change my diet? Are there foods or drinks that I should avoid?  What activities are safe for me?  What should I do if I want to get pregnant? Contact a health care provider if:  You miss a dose of medicine: ? And you are not sure what to do. ? For more than one day.  You have: ? Menstrual bleeding that is heavier than normal. ? Bloody or brown urine. ? Easy bruising. ? Black and tarry stool or bright red stool. ? Side effects from your medicine.  You  feel weak or dizzy.  You become pregnant. Get help right away if:  You have bleeding that will not stop within 20 minutes from: ? The nose. ? The gums. ? A cut on the skin.  You have a severe headache or stomachache.  You vomit or cough up blood.  You fall or hit your head. Summary  Anticoagulant therapy, also called blood thinner therapy, is medicine that helps to prevent and treat blood clots.  Anticoagulants work in different ways to prevent blood clots. They also have different risks and side effects.  Talk with your health care provider about any precautions that you should take while on anticoagulant therapy. This information is not intended to replace advice given to you by your health care provider. Make sure you discuss any questions you have with your health care provider. Document Revised: 01/22/2019 Document Reviewed: 12/19/2016 Elsevier Patient Education  [place].

## 2020-06-24 NOTE — Progress Notes (Signed)
Subjective:    Patient ID: Kristi Meyers, female    DOB: 1956/02/25, 64 y.o.   MRN: [id number]  No chief complaint on file. Pt accompanied by her daughter.  HPI Pt is a 64 yo female with pmh sig for h/o LLE DVT, b/l PEs and saddle PE with cor pulmonale on anticoagulation (2019), AAA (3.1 cm), GERD, HTN, OA of R knee, and HLD who was seen today for ED f/u.  Pt seen in ED on 06/17/20 for symptomatic anemia (hgb 9.3, hypotensive) 2/2 heavy vaginal bleeding x 4 days.  Pelvic u/s with intramural fibroid, normal endometrial stripe.  AKI (creat 1.79/GFR 34) with elevated potassium (5.5).  Pt also had CP, Troponin negative x 2.  Pt advised to hold anticoagulation.    Since d/c, pt denies new bleeding. Pt states she is no longer on Eliquis 5 mg BID.  Started on Coumadin 5 mg daily in July.  Pt denies SOB. Endorses fatigue and occasional dizziness.  Past Medical History:  Diagnosis Date  . GERD (gastroesophageal reflux disease)   . High cholesterol   . Hypertension   . Vertigo     No Known Allergies  ROS General: Denies fever, chills, night sweats, changes in weight, changes in appetite  + fatigue, dizziness HEENT: Denies headaches, ear pain, changes in vision, rhinorrhea, sore throat CV: Denies CP, palpitations, SOB, orthopnea Pulm: Denies SOB, cough, wheezing GI: Denies abdominal pain, nausea, vomiting, diarrhea, constipation GU: Denies dysuria, hematuria, frequency, vaginal discharge  +recent vaginal bleeding. Msk: Denies muscle cramps, joint pains Neuro: Denies weakness, numbness, tingling Skin: Denies rashes, bruising Psych: Denies depression, anxiety, hallucinations     Objective:    Blood pressure 118/68, pulse 87, temperature 98.1 F (36.7 C), temperature source Oral, weight 195 lb (88.5 kg), last menstrual period 11/17/2008, SpO2 96 %.  Gen. Pleasant, well-nourished, in no distress, normal affect   HEENT: [place]/AT, face symmetric, conjunctiva clear, no scleral icterus, PERRLA,  EOMI, nares patent without drainage Lungs: no accessory muscle use, CTAB, no wheezes or rales Cardiovascular: RRR, no m/r/g, no peripheral edema Abdomen: BS present, soft, NT/ND Musculoskeletal: No deformities, no cyanosis or clubbing, normal tone Neuro:  A&Ox3, CN II-XII intact, normal gait Skin:  Warm, no lesions/ rash   Wt Readings from Last 3 Encounters:  06/24/20 195 lb (88.5 kg)  06/17/20 216 lb 14.9 oz (98.4 kg)  11/13/18 217 lb (98.4 kg)    Lab Results  Component Value Date   WBC 12.8 (H) 06/17/2020   HGB 9.3 (L) 06/17/2020   HCT 30.8 (L) 06/17/2020   PLT 282 06/17/2020   GLUCOSE 120 (H) 06/17/2020   ALT 16 06/17/2020   AST 30 06/17/2020   NA 139 06/17/2020   K 5.5 (H) 06/17/2020   CL 109 06/17/2020   CREATININE 1.79 (H) 06/17/2020   BUN 23 06/17/2020   CO2 21 (L) 06/17/2020   TSH 1.187 12/09/2017   INR 1.18 12/09/2017    Assessment/Plan:  Postmenopausal bleeding  -Discussed possible causes including fibroid, polyp, endometrial atrophy, malignancy. -transvaginal u/s 06/17/20 with fluid and endocervical canal compatible with reported vaginal bleeding, fibroid anterior mid to lower uterine segment.  No focal abnormality or significant endometrial thickening.  Nonvisualization of ovaries. -discussed further evaluation needed such as endometrial biopsy and possible MRI pelvis - Plan: CBC with Differential/Platelet, Ambulatory referral to Gynecology, CBC with Differential/Platelet  History of pulmonary embolus (PE) -Eliquis 5 mg d/c'd 2/2 cost. -Pt to restart Coumadin 5 mg daily. -Discussed r/b/a of anticoagulation. -Given  precautions  - Plan: Protime-INR, Ambulatory referral to Gynecology, Protime-INR  AKI (acute kidney injury) ([place])  -Creatinine 1.79 and GFR 34 point 06/17/2020. -Discussed the importance of increasing p.o. intake of water -Avoid nephrotoxic meds Dose medications -We will recheck labs - Plan: BMP with eGFR([facility])  Fibroid  -noted on  transvaginal u/s 06/17/20 - Plan: Ambulatory referral to Gynecology  F/u in 2-4 wks prn  Grier Mitts, MD

## 2020-06-25 LAB — CBC WITH DIFFERENTIAL/PLATELET
Absolute Monocytes: 984 cells/uL — ABNORMAL HIGH (ref 200–950)
Basophils Absolute: 92 cells/uL (ref 0–200)
Basophils Relative: 1 %
Eosinophils Absolute: 156 cells/uL (ref 15–500)
Eosinophils Relative: 1.7 %
HCT: 25.7 % — ABNORMAL LOW (ref 35.0–45.0)
Hemoglobin: 8 g/dL — ABNORMAL LOW (ref 11.7–15.5)
Lymphs Abs: 2392 cells/uL (ref 850–3900)
MCH: 26.1 pg — ABNORMAL LOW (ref 27.0–33.0)
MCHC: 31.1 g/dL — ABNORMAL LOW (ref 32.0–36.0)
MCV: 84 fL (ref 80.0–100.0)
MPV: 10 fL (ref 7.5–12.5)
Monocytes Relative: 10.7 %
Neutro Abs: 5575 cells/uL (ref 1500–7800)
Neutrophils Relative %: 60.6 %
Platelets: 368 10*3/uL (ref 140–400)
RBC: 3.06 10*6/uL — ABNORMAL LOW (ref 3.80–5.10)
RDW: 15.8 % — ABNORMAL HIGH (ref 11.0–15.0)
Total Lymphocyte: 26 %
WBC: 9.2 10*3/uL (ref 3.8–10.8)

## 2020-06-25 LAB — BASIC METABOLIC PANEL WITH GFR
BUN/Creatinine Ratio: 15 (calc) (ref 6–22)
BUN: 15 mg/dL (ref 7–25)
CO2: 25 mmol/L (ref 20–32)
Calcium: 8.7 mg/dL (ref 8.6–10.4)
Chloride: 107 mmol/L (ref 98–110)
Creat: 1 mg/dL — ABNORMAL HIGH (ref 0.50–0.99)
GFR, Est African American: 68 mL/min/{1.73_m2} (ref >=60)
GFR, Est Non African American: 59 mL/min/{1.73_m2} — ABNORMAL LOW (ref >=60)
Glucose, Bld: 101 mg/dL — ABNORMAL HIGH (ref 65–99)
Potassium: 3.9 mmol/L (ref 3.5–5.3)
Sodium: 139 mmol/L (ref 135–146)

## 2020-06-25 LAB — PROTIME-INR
INR: 1.3 — ABNORMAL HIGH
Prothrombin Time: 13.9 s — ABNORMAL HIGH (ref 9.0–11.5)

## 2020-07-01 ENCOUNTER — Encounter: Payer: Self-pay | Admitting: Family Medicine

## 2020-07-01 DIAGNOSIS — Z86711 Personal history of pulmonary embolism: Secondary | ICD-10-CM | POA: Insufficient documentation

## 2020-07-01 DIAGNOSIS — D219 Benign neoplasm of connective and other soft tissue, unspecified: Secondary | ICD-10-CM | POA: Insufficient documentation

## 2020-07-01 DIAGNOSIS — N950 Postmenopausal bleeding: Secondary | ICD-10-CM | POA: Insufficient documentation

## 2020-07-06 ENCOUNTER — Other Ambulatory Visit (HOSPITAL_COMMUNITY)
Admission: RE | Admit: 2020-07-06 | Discharge: 2020-07-06 | Disposition: A | Discharge status: Home / Self Care | Payer: Medicare Other | Source: Ambulatory Visit | Attending: Obstetrics and Gynecology | Admitting: Obstetrics and Gynecology

## 2020-07-06 ENCOUNTER — Encounter: Payer: Self-pay | Admitting: Obstetrics and Gynecology

## 2020-07-06 ENCOUNTER — Other Ambulatory Visit: Payer: Self-pay

## 2020-07-06 ENCOUNTER — Ambulatory Visit: Payer: Medicare Other | Admitting: Obstetrics and Gynecology

## 2020-07-06 VITALS — BP 142/86 | HR 68 | Resp 12 | Ht 64.0 in | Wt 195.0 lb

## 2020-07-06 DIAGNOSIS — Z87448 Personal history of other diseases of urinary system: Secondary | ICD-10-CM | POA: Diagnosis not present

## 2020-07-06 DIAGNOSIS — Z87898 Personal history of other specified conditions: Secondary | ICD-10-CM

## 2020-07-06 DIAGNOSIS — N950 Postmenopausal bleeding: Secondary | ICD-10-CM | POA: Diagnosis present

## 2020-07-06 LAB — POCT URINALYSIS DIPSTICK
Bilirubin, UA: NEGATIVE
Glucose, UA: NEGATIVE
Ketones, UA: NEGATIVE
Nitrite, UA: POSITIVE
Protein, UA: POSITIVE — AB
Spec Grav, UA: 1.015 (ref 1.010–1.025)
Urobilinogen, UA: NEGATIVE E.U./dL — AB
pH, UA: 5 (ref 5.0–8.0)

## 2020-07-06 NOTE — Patient Instructions (Signed)

## 2020-07-06 NOTE — Progress Notes (Signed)
64 y.o. No obstetric history on file. Single Black or African American Not Hispanic or Latino female here for a new patient exam for PMP bleeding. The patient is sent here by Dr Volanda Napoleon for evaluation of PMP bleeding. She was seen in the ER with heavy vaginal bleeding for 2 days on 06/17/20. She was going through a pad an hour.  She has a h/o bilateral PE in 2019 and was on anticoagulants, which she stopped that day. She restarted coumadin when she saw Dr Volanda Napoleon on 06/24/20. No bleeding in the last week. No abdominal pain.  She was anemic with a hgb of 9.3. F/U blood work on 06/24/20 with Hgb of 8. She is being scheduled for an iron transfusion. She also had an elevated creatinine, felt to be from acute kidney injury.   Sometimes sexually active, no pain. Went through menopause in her late 35's, early 19's. She has intermittently noticed some spotting over the years. No post coital spotting. No pain.   Ultrasound: IMPRESSION: 1. Small amount of low-attenuation fluid within the endocervical canal compatible with reported vaginal bleeding. 2. Fibroid in the anterior mid to lower uterine segment with some mild distortion of the adjacent endometrial canal. 3. No other focal endometrial abnormality or significant endometrial thickening. 4. Nonvisualization of the ovaries. No visible abnormalities in the adnexal regions.    Ultrasound images reviewed with the patient.  Patient's last menstrual period was 11/17/2008 (approximate).          Sexually active: Yes.    The current method of family planning is post menopausal status.    Exercising:no  Smoker:  no  Health Maintenance: Pap:  Unsure - been a while History of abnormal Pap:  no MMG:  3 years ago approx  BMD:   Never   Colonoscopy: approx 2016 TDaP:  Unsure  Gardasil: NA   reports that she has been smoking cigarettes. She has never used smokeless tobacco. She reports that she does not drink alcohol and does not use drugs.  Past Medical  History:  Diagnosis Date  . GERD (gastroesophageal reflux disease)   . High cholesterol   . Hypertension   . Vertigo     Past Surgical History:  Procedure Laterality Date  . CHOLECYSTECTOMY    . IR ANGIOGRAM PULMONARY BILATERAL SELECTIVE  12/10/2017  . IR ANGIOGRAM SELECTIVE EACH ADDITIONAL VESSEL  12/10/2017  . IR ANGIOGRAM SELECTIVE EACH ADDITIONAL VESSEL  12/10/2017  . IR INFUSION THROMBOL ARTERIAL INITIAL (MS)  12/10/2017  . IR INFUSION THROMBOL ARTERIAL INITIAL (MS)  12/10/2017  . IR THROMB F/U EVAL ART/VEN FINAL DAY (MS)  12/11/2017  . IR US GUIDE VASC ACCESS RIGHT  12/10/2017    Current Outpatient Medications  Medication Sig Dispense Refill  . AMLODIPINE BESYLATE PO Take 5 mg by mouth daily.    Marland Kitchen atorvastatin (LIPITOR) 40 MG tablet Take 40 mg by mouth daily.    . carvedilol (COREG) 25 MG tablet Take 25 mg by mouth 2 (two) times daily with a meal.    . losartan (COZAAR) 50 MG tablet Take 50 mg by mouth daily.    . meclizine (ANTIVERT) 12.5 MG tablet Take 12.5 mg by mouth 2 (two) times daily.    Marland Kitchen POTASSIUM CHLORIDE ER PO Take 20 mg by mouth daily.    . traMADol (ULTRAM) 50 MG tablet Take 1 tablet (50 mg total) by mouth every 8 (eight) hours as needed for severe pain. 10 tablet 0  . warfarin (COUMADIN) 5 MG tablet  Take 5 mg by mouth daily.    . cephALEXin (KEFLEX) 500 MG capsule Take 1 capsule (500 mg total) by mouth 4 (four) times daily. (Patient not taking: Reported on 07/06/2020) 28 capsule 0   No current facility-administered medications for this visit.    History reviewed. No pertinent family history.  Review of Systems The patient reported blood in her urine, on further questioning she meant the blood was coming from her vagina.   Exam:   BP (!) 142/86 (BP Location: Right Arm, Patient Position: Sitting, Cuff Size: Normal)   Pulse 68   Resp 12   Ht 5\' 4"  (1.626 m)   Wt 195 lb (88.5 kg)   LMP 11/17/2008 (Approximate)   SpO2 97%   BMI 33.47 kg/m   Weight change:  @WEIGHTCHANGE @ Height:   Height: 5\' 4"  (162.6 cm)  Ht Readings from Last 3 Encounters:  07/06/20 5\' 4"  (1.626 m)  06/17/20 5\' 2"  (1.575 m)  02/18/18 5\' 2"  (1.575 m)    General appearance: alert, cooperative and appears stated age Head: Normocephalic, without obvious abnormality, atraumatic Neck: no adenopathy, supple, symmetrical, trachea midline and thyroid normal to inspection and palpation Lungs: clear to auscultation bilaterally Cardiovascular: regular rate and rhythm Abdomen: soft, non-tender; non distended,  no masses,  no organomegaly Extremities: extremities normal, atraumatic, no cyanosis or edema Skin: Skin color, texture, turgor normal. No rashes or lesions Lymph nodes: Cervical, supraclavicular, and axillary nodes normal. No abnormal inguinal nodes palpated Neurologic: Grossly normal   Pelvic: External genitalia:  no lesions              Urethra:  normal appearing urethra with no masses, tenderness or lesions              Bartholins and Skenes: normal                 Vagina: normal appearing vagina with normal color and discharge, no lesions              Cervix: no lesions               Bimanual Exam:  Uterus:  normal size, contour, position, consistency, mobility, non-tender and anteverted              Adnexa: no mass, fullness, tenderness                 The risks of endometrial biopsy were reviewed and a consent was obtained.  A speculum was placed in the vagina and the cervix was cleansed with betadine. The pipelle was placed into the endometrial cavity. The uterus sounded to ~7-8 cm. The endometrial biopsy was performed, taking care to get a representative sample, sampling 360 degrees of the uterine cavity. A small amount of tissue was obtained. The speculum was removed. There were no complications.   Gae Dry chaperoned for the exam.  A:  PMP bleeding while on anticoagulant, not currently bleeding  Ultrasound images reviewed, small benign appearing intramural  myoma, thin endometrium. I suspect that her bleeding was from atrophy in combination with anticoagulation. She should not be bleeding from the fibroid  Severe anemia, hgb down to 8  P:   Pap with reflex hpv  Endometrial biopsy  Further plans depending on results  She was taken off of Eliquis and started on coumadin this month  Will f/u with Dr Volanda Napoleon for an iron transfusion.   CC: Dr Volanda Napoleon

## 2020-07-08 LAB — SURGICAL PATHOLOGY

## 2020-07-08 LAB — CYTOLOGY - PAP: Diagnosis: NEGATIVE

## 2020-07-09 LAB — PROTIME-INR: INR: 3.4 — AB (ref 0.9–1.1)

## 2020-07-12 ENCOUNTER — Telehealth: Payer: Self-pay | Admitting: *Deleted

## 2020-07-12 DIAGNOSIS — N840 Polyp of corpus uteri: Secondary | ICD-10-CM

## 2020-07-12 DIAGNOSIS — N950 Postmenopausal bleeding: Secondary | ICD-10-CM

## 2020-07-12 NOTE — Telephone Encounter (Signed)
Patient returned call

## 2020-07-12 NOTE — Telephone Encounter (Signed)
Spoke with patient, advised per Dr. Talbert Nan. Brief explanation of SHGM provided, patient agreeable to proceed with scheduling. SHGM scheduled for 10/5 at 1pm, patient declined appt offered on 9/28. Order placed for precert. Patient verbalizes understanding and is agreeable.   Routing to [facility] [employer].   Encounter closed.

## 2020-07-12 NOTE — Telephone Encounter (Signed)
Burnice Logan, RN  07/12/2020 10:41 AM EDT Back to Top    Left message to call Sharee Pimple, RN at [facility].

## 2020-07-12 NOTE — Telephone Encounter (Signed)
-----   Message from Salvadore Dom, MD sent at 07/09/2020  2:06 PM EDT ----- Please let the patient know that her endometrial biopsy returned with an endometrial polyp. I'm concerned there could be residual polyp. On recent ultrasound her endometrial stripe was 4 mm. Please explain and schedule her for a sonohysterogram. I don't want to take her to the OR for hysteroscopy if no polyp is seen on further imaging.

## 2020-07-13 ENCOUNTER — Telehealth: Payer: Self-pay

## 2020-07-13 NOTE — Telephone Encounter (Signed)
Call to patient. Per DPR, OK to leave message on voicemail.   Left voicemail requesting a return call to Bernerd Terhune to review benefits for scheduled Sonohysterogram with Jill Jertson, MD. 

## 2020-07-13 NOTE — Telephone Encounter (Signed)
Patient is returning call.  °

## 2020-07-14 NOTE — Telephone Encounter (Signed)
Spoke with patient regarding benefits for scheduled Sonohysterogram. Patient acknowledges understanding of information presented. Encounter closed. 

## 2020-07-16 ENCOUNTER — Encounter: Payer: Self-pay | Admitting: Family Medicine

## 2020-07-16 NOTE — Progress Notes (Signed)
Unable to reach patient or leave message/mailed letter with details of lab results/thx dmf

## 2020-07-20 ENCOUNTER — Other Ambulatory Visit: Payer: Self-pay | Admitting: Obstetrics and Gynecology

## 2020-07-20 ENCOUNTER — Encounter: Payer: Self-pay | Admitting: Obstetrics and Gynecology

## 2020-07-20 ENCOUNTER — Other Ambulatory Visit: Payer: Self-pay

## 2020-07-20 ENCOUNTER — Ambulatory Visit (INDEPENDENT_AMBULATORY_CARE_PROVIDER_SITE_OTHER): Payer: Medicare Other | Admitting: Obstetrics and Gynecology

## 2020-07-20 ENCOUNTER — Ambulatory Visit (INDEPENDENT_AMBULATORY_CARE_PROVIDER_SITE_OTHER): Payer: Medicare Other

## 2020-07-20 VITALS — BP 136/88 | HR 80 | Ht 64.5 in | Wt 185.0 lb

## 2020-07-20 DIAGNOSIS — N950 Postmenopausal bleeding: Secondary | ICD-10-CM

## 2020-07-20 DIAGNOSIS — N840 Polyp of corpus uteri: Secondary | ICD-10-CM

## 2020-07-20 NOTE — Progress Notes (Signed)
GYNECOLOGY  VISIT   HPI: 64 y.o.   Single Black or African American Not Hispanic or Latino  female   No obstetric history on file. with Patient's last menstrual period was 11/17/2008 (approximate).   here for further evaluation of PMP bleeding. She was seen in the ER on 06/17/20 with heavy vaginal bleeding (she is on anticoagulants).  06/17/20 Ultrasound: IMPRESSION: 1. Small amount of low-attenuation fluid within the endocervical canal compatible with reported vaginal bleeding. 2. Fibroid in the anterior mid to lower uterine segment with some mild distortion of the adjacent endometrial canal. 3. No other focal endometrial abnormality or significant endometrial thickening. 4. Nonvisualization of the ovaries. No visible abnormalities in the adnexal regions.  Endometrial biopsy from 07/06/20 returned with benign endometrial polyp.     GYNECOLOGIC HISTORY: Patient's last menstrual period was 11/17/2008 (approximate). Contraception:PMP Menopausal hormone therapy: none        OB History   No obstetric history on file.        Patient Active Problem List   Diagnosis Date Noted  . Postmenopausal bleeding 07/01/2020  . Fibroid 07/01/2020  . History of pulmonary embolus (PE) 07/01/2020  . Primary localized osteoarthritis of right knee 03/14/2019  . Abdominal aortic aneurysm (AAA) 3.0 cm to 5.0 cm in diameter in female ([place]) 02/18/2018  . Shock ([place]) 12/15/2017  . Elevated troponin 12/15/2017  . AKI (acute kidney injury) ([place]) 12/15/2017  . E. coli UTI 12/15/2017  . Respiratory distress   . Acute saddle pulmonary embolus ([place]) 12/10/2017    Past Medical History:  Diagnosis Date  . GERD (gastroesophageal reflux disease)   . High cholesterol   . Hypertension   . Vertigo     Past Surgical History:  Procedure Laterality Date  . CHOLECYSTECTOMY    . IR ANGIOGRAM PULMONARY BILATERAL SELECTIVE  12/10/2017  . IR ANGIOGRAM SELECTIVE EACH ADDITIONAL VESSEL  12/10/2017  . IR ANGIOGRAM  SELECTIVE EACH ADDITIONAL VESSEL  12/10/2017  . IR INFUSION THROMBOL ARTERIAL INITIAL (MS)  12/10/2017  . IR INFUSION THROMBOL ARTERIAL INITIAL (MS)  12/10/2017  . IR THROMB F/U EVAL ART/VEN FINAL DAY ([place])  12/11/2017  . IR US GUIDE VASC ACCESS RIGHT  12/10/2017    Current Outpatient Medications  Medication Sig Dispense Refill  . AMLODIPINE BESYLATE PO Take 5 mg by mouth daily.    Marland Kitchen atorvastatin (LIPITOR) 40 MG tablet Take 40 mg by mouth daily.    . carvedilol (COREG) 25 MG tablet Take 25 mg by mouth 2 (two) times daily with a meal.    . losartan (COZAAR) 50 MG tablet Take 50 mg by mouth daily.    . meclizine (ANTIVERT) 12.5 MG tablet Take 12.5 mg by mouth 2 (two) times daily.    Marland Kitchen POTASSIUM CHLORIDE ER PO Take 20 mg by mouth daily.    . traMADol (ULTRAM) 50 MG tablet Take 1 tablet (50 mg total) by mouth every 8 (eight) hours as needed for severe pain. 10 tablet 0  . warfarin (COUMADIN) 5 MG tablet Take 5 mg by mouth daily.     No current facility-administered medications for this visit.     ALLERGIES: Patient has no known allergies.  History reviewed. No pertinent family history.  Social History   Socioeconomic History  . Marital status: Single    Spouse name: Not on file  . Number of children: Not on file  . Years of education: Not on file  . Highest education level: Not on file  Occupational History  .  Not on file  Tobacco Use  . Smoking status: Current Some Day Smoker    Types: Cigarettes  . Smokeless tobacco: Never Used  Substance and Sexual Activity  . Alcohol use: No  . Drug use: No  . Sexual activity: Not on file  Other Topics Concern  . Not on file  Social History Narrative  . Not on file   Social Determinants of Health   Financial Resource Strain:   . Difficulty of Paying Living Expenses: Not on file  Food Insecurity:   . Worried About Charity fundraiser in the Last Year: Not on file  . Ran Out of Food in the Last Year: Not on file  Transportation Needs:    . Lack of Transportation (Medical): Not on file  . Lack of Transportation (Non-Medical): Not on file  Physical Activity:   . Days of Exercise per Week: Not on file  . Minutes of Exercise per Session: Not on file  Stress:   . Feeling of Stress : Not on file  Social Connections:   . Frequency of Communication with Friends and Family: Not on file  . Frequency of Social Gatherings with Friends and Family: Not on file  . Attends Religious Services: Not on file  . Active Member of Clubs or Organizations: Not on file  . Attends Archivist Meetings: Not on file  . Marital Status: Not on file  Intimate Partner Violence:   . Fear of Current or Ex-Partner: Not on file  . Emotionally Abused: Not on file  . Physically Abused: Not on file  . Sexually Abused: Not on file    Review of Systems  Constitutional: Negative.   HENT: Negative.   Eyes: Negative.   Respiratory: Negative.   Cardiovascular: Negative.   Gastrointestinal: Negative.   Genitourinary: Negative.   Musculoskeletal: Negative.   Skin: Negative.   Neurological: Negative.   Endo/Heme/Allergies: Negative.   Psychiatric/Behavioral: Negative.     PHYSICAL EXAMINATION:    BP 136/88 (BP Location: Right Arm, Patient Position: Sitting, Cuff Size: Normal)   Pulse 80   Ht 5' 4.5" (1.638 m)   Wt 185 lb (83.9 kg)   LMP 11/17/2008 (Approximate)   SpO2 98%   BMI 31.26 kg/m     General appearance: alert, cooperative and appears stated age  Pelvic: External genitalia:  no lesions              Urethra:  normal appearing urethra with no masses, tenderness or lesions              Bartholins and Skenes: normal                 Vagina: normal appearing vagina with normal color and discharge, no lesions              Cervix: no lesions   Ultrasound with anterior myoma, thin endometrial stripe, no clear polyps on ultrasound.  Sonohysterogram The procedure and risks of the procedure were reviewed with the patient, consent  form was signed. A speculum was placed in the vagina and the cervix was cleansed with betadine. The sonohysterogram catheter was inserted into the uterine cavity without difficulty. Saline was infused under direct observation with the ultrasound. There were 2 small posterior uterine wall defects concerning for endometrial polyps. Intramural myoma doesn't enter the cavity.The catheter was removed.   Chaperone was present for exam.  Ultrasound and sonohysterogram images were reviewed with the patient  ASSESSMENT PMP bleeding, endometrial polyp on  biopsy    PLAN Sonohysterogram with 2 small intracavitary defects suspicious for polyps. Plan: hysteroscopy, possible polypectomy, dilation and curettage. Reviewed risks, including: bleeding, infection, and uterine perforation Discussed post operative recovery ACOG handouts on hysteroscopy, D&C given  In addition to reviewing the ultrasound results, approximately 10 minutes was spent in counseling the patient about endometrial polyps and surgery.

## 2020-07-26 ENCOUNTER — Telehealth: Payer: Self-pay

## 2020-07-26 NOTE — Telephone Encounter (Signed)
Spoke with patient regarding surgery benefits. Patient acknowledges understanding of information presented. Patient is aware that benefits presented are for professional benefits only. Patient is aware that once surgery is scheduled, the hospital will call with separate benefits. Patient is aware of surgery cancellation policy.  Call transferred to [facility] to proceed with scheduling.

## 2020-07-26 NOTE — Telephone Encounter (Signed)
Spoke with patient. Surgery scheduled for 09/07/2020 at 0730 at [facility]. Pre op scheduled for 08/16/2020 at 4:30 pm. COVID test scheduled for 09/02/2020 at 10:30 am at [facility] location. Patient is aware of the need to quarantine after test until surgery. 2 week post op scheduled for 09/20/2020 at 4:30 pm with Dr.Jertson. Surgery instructions reviewed and to be given to patient at her pre op appointment.  Routing to provider and will close encounter.

## 2020-08-03 LAB — PROTIME-INR: INR: 6.8 — AB (ref 0.9–1.1)

## 2020-08-11 LAB — PROTIME-INR: INR: 1.9 — AB (ref 0.9–1.1)

## 2020-08-16 ENCOUNTER — Ambulatory Visit (INDEPENDENT_AMBULATORY_CARE_PROVIDER_SITE_OTHER): Payer: Medicare Other | Admitting: Obstetrics and Gynecology

## 2020-08-16 ENCOUNTER — Other Ambulatory Visit: Payer: Self-pay

## 2020-08-16 ENCOUNTER — Telehealth: Payer: Self-pay

## 2020-08-16 ENCOUNTER — Encounter: Payer: Self-pay | Admitting: Obstetrics and Gynecology

## 2020-08-16 VITALS — BP 138/74 | HR 94 | Ht 66.0 in | Wt 198.4 lb

## 2020-08-16 DIAGNOSIS — N950 Postmenopausal bleeding: Secondary | ICD-10-CM

## 2020-08-16 NOTE — Progress Notes (Signed)
GYNECOLOGY  VISIT   HPI: 64 y.o.   Single Black or African American Not Hispanic or Latino  female   No obstetric history on file. with Patient's last menstrual period was 11/17/2008 (approximate).   here for a preoperative visit. She presented to the ER on 06/17/20 with a 2 day h/o heavy vaginal bleeding. On 06/24/20 her Hgb was 8 gm/dl. Primary did an iron transfusion.   On anticoagulants for a h/o bilateral PE in 2019.      07/06/20: pap negative 07/06/20: Endometrial biopsy with benign endometrial polyp.   07/20/20: sonohysterogram with 2 small posterior uterine wall defects concerning for endometrial polyps.   GYNECOLOGIC HISTORY: Patient's last menstrual period was 11/17/2008 (approximate). Contraception:none Menopausal hormone therapy: none        OB History   No obstetric history on file.        Patient Active Problem List   Diagnosis Date Noted  . Postmenopausal bleeding 07/01/2020  . Fibroid 07/01/2020  . History of pulmonary embolus (PE) 07/01/2020  . Primary localized osteoarthritis of right knee 03/14/2019  . Abdominal aortic aneurysm (AAA) 3.0 cm to 5.0 cm in diameter in female ([place]) 02/18/2018  . Shock ([place]) 12/15/2017  . Elevated troponin 12/15/2017  . AKI (acute kidney injury) ([place]) 12/15/2017  . E. coli UTI 12/15/2017  . Respiratory distress   . Acute saddle pulmonary embolus ([place]) 12/10/2017    Past Medical History:  Diagnosis Date  . GERD (gastroesophageal reflux disease)   . High cholesterol   . Hypertension   . Vertigo     Past Surgical History:  Procedure Laterality Date  . CHOLECYSTECTOMY    . IR ANGIOGRAM PULMONARY BILATERAL SELECTIVE  12/10/2017  . IR ANGIOGRAM SELECTIVE EACH ADDITIONAL VESSEL  12/10/2017  . IR ANGIOGRAM SELECTIVE EACH ADDITIONAL VESSEL  12/10/2017  . IR INFUSION THROMBOL ARTERIAL INITIAL (MS)  12/10/2017  . IR INFUSION THROMBOL ARTERIAL INITIAL (MS)  12/10/2017  . IR THROMB F/U EVAL ART/VEN FINAL DAY ([facility])  12/11/2017  . IR US  GUIDE VASC ACCESS RIGHT  12/10/2017    Current Outpatient Medications  Medication Sig Dispense Refill  . AMLODIPINE BESYLATE PO Take 5 mg by mouth daily.    Marland Kitchen atorvastatin (LIPITOR) 40 MG tablet Take 40 mg by mouth daily.    . carvedilol (COREG) 25 MG tablet Take 25 mg by mouth 2 (two) times daily with a meal.    . losartan (COZAAR) 50 MG tablet Take 50 mg by mouth daily.    . meclizine (ANTIVERT) 12.5 MG tablet Take 12.5 mg by mouth 2 (two) times daily.    Marland Kitchen POTASSIUM CHLORIDE ER PO Take 20 mg by mouth daily.    . traMADol (ULTRAM) 50 MG tablet Take 1 tablet (50 mg total) by mouth every 8 (eight) hours as needed for severe pain. 10 tablet 0  . warfarin (COUMADIN) 5 MG tablet Take 5 mg by mouth daily.     No current facility-administered medications for this visit.     ALLERGIES: Patient has no known allergies.  No family history on file.  Social History   Socioeconomic History  . Marital status: Single    Spouse name: Not on file  . Number of children: Not on file  . Years of education: Not on file  . Highest education level: Not on file  Occupational History  . Not on file  Tobacco Use  . Smoking status: Current Some Day Smoker    Types: Cigarettes  . Smokeless  tobacco: Never Used  Substance and Sexual Activity  . Alcohol use: No  . Drug use: No  . Sexual activity: Not on file  Other Topics Concern  . Not on file  Social History Narrative  . Not on file   Social Determinants of Health   Financial Resource Strain:   . Difficulty of Paying Living Expenses: Not on file  Food Insecurity:   . Worried About Charity fundraiser in the Last Year: Not on file  . Ran Out of Food in the Last Year: Not on file  Transportation Needs:   . Lack of Transportation (Medical): Not on file  . Lack of Transportation (Non-Medical): Not on file  Physical Activity:   . Days of Exercise per Week: Not on file  . Minutes of Exercise per Session: Not on file  Stress:   . Feeling of  Stress : Not on file  Social Connections:   . Frequency of Communication with Friends and Family: Not on file  . Frequency of Social Gatherings with Friends and Family: Not on file  . Attends Religious Services: Not on file  . Active Member of Clubs or Organizations: Not on file  . Attends Archivist Meetings: Not on file  . Marital Status: Not on file  Intimate Partner Violence:   . Fear of Current or Ex-Partner: Not on file  . Emotionally Abused: Not on file  . Physically Abused: Not on file  . Sexually Abused: Not on file    ROS  PHYSICAL EXAMINATION:    LMP 11/17/2008 (Approximate)     General appearance: alert, cooperative and appears stated age Neck: no adenopathy, supple, symmetrical, trachea midline and thyroid normal to inspection and palpation Heart: regular rate and rhythm Lungs: CTAB Abdomen: soft, non-tender; bowel sounds normal; no masses,  no organomegaly Extremities: normal, atraumatic, no cyanosis Skin: normal color, texture and turgor, no rashes or lesions Lymph: normal cervical supraclavicular and inguinal nodes Neurologic: grossly normal   ASSESSMENT PMP bleeding, small intracavitary defects on sonohysterogram Anemia, getting iron transfusion through her primary On anticoagulation    PLAN Hysteroscopy, polypectomy, D&C Needs preoperative clearance, states she just saw her Cardiologist (will get records)  Will need to stop her coumadin (typically 5 days prior, but will check with Dr Terrence Dupont)   Her daughter [name] was present throughout the visit.

## 2020-08-16 NOTE — H&P (View-Only) (Signed)
GYNECOLOGY  VISIT   HPI: 64 y.o.   Single Black or African American Not Hispanic or Latino  female   No obstetric history on file. with Patient's last menstrual period was 11/17/2008 (approximate).   here for a preoperative visit. She presented to the ER on 06/17/20 with a 2 day h/o heavy vaginal bleeding. On 06/24/20 her Hgb was 8 gm/dl. Primary did an iron transfusion.   On anticoagulants for a h/o bilateral PE in 2019.      07/06/20: pap negative 07/06/20: Endometrial biopsy with benign endometrial polyp.   07/20/20: sonohysterogram with 2 small posterior uterine wall defects concerning for endometrial polyps.   GYNECOLOGIC HISTORY: Patient's last menstrual period was 11/17/2008 (approximate). Contraception:none Menopausal hormone therapy: none        OB History   No obstetric history on file.        Patient Active Problem List   Diagnosis Date Noted  . Postmenopausal bleeding 07/01/2020  . Fibroid 07/01/2020  . History of pulmonary embolus (PE) 07/01/2020  . Primary localized osteoarthritis of right knee 03/14/2019  . Abdominal aortic aneurysm (AAA) 3.0 cm to 5.0 cm in diameter in female ([place]) 02/18/2018  . Shock ([place]) 12/15/2017  . Elevated troponin 12/15/2017  . AKI (acute kidney injury) ([place]) 12/15/2017  . E. coli UTI 12/15/2017  . Respiratory distress   . Acute saddle pulmonary embolus ([place]) 12/10/2017    Past Medical History:  Diagnosis Date  . GERD (gastroesophageal reflux disease)   . High cholesterol   . Hypertension   . Vertigo     Past Surgical History:  Procedure Laterality Date  . CHOLECYSTECTOMY    . IR ANGIOGRAM PULMONARY BILATERAL SELECTIVE  12/10/2017  . IR ANGIOGRAM SELECTIVE EACH ADDITIONAL VESSEL  12/10/2017  . IR ANGIOGRAM SELECTIVE EACH ADDITIONAL VESSEL  12/10/2017  . IR INFUSION THROMBOL ARTERIAL INITIAL (MS)  12/10/2017  . IR INFUSION THROMBOL ARTERIAL INITIAL (MS)  12/10/2017  . IR THROMB F/U EVAL ART/VEN FINAL DAY ([place])  12/11/2017  . IR US  GUIDE VASC ACCESS RIGHT  12/10/2017    Current Outpatient Medications  Medication Sig Dispense Refill  . AMLODIPINE BESYLATE PO Take 5 mg by mouth daily.    Marland Kitchen atorvastatin (LIPITOR) 40 MG tablet Take 40 mg by mouth daily.    . carvedilol (COREG) 25 MG tablet Take 25 mg by mouth 2 (two) times daily with a meal.    . losartan (COZAAR) 50 MG tablet Take 50 mg by mouth daily.    . meclizine (ANTIVERT) 12.5 MG tablet Take 12.5 mg by mouth 2 (two) times daily.    Marland Kitchen POTASSIUM CHLORIDE ER PO Take 20 mg by mouth daily.    . traMADol (ULTRAM) 50 MG tablet Take 1 tablet (50 mg total) by mouth every 8 (eight) hours as needed for severe pain. 10 tablet 0  . warfarin (COUMADIN) 5 MG tablet Take 5 mg by mouth daily.     No current facility-administered medications for this visit.     ALLERGIES: Patient has no known allergies.  No family history on file.  Social History   Socioeconomic History  . Marital status: Single    Spouse name: Not on file  . Number of children: Not on file  . Years of education: Not on file  . Highest education level: Not on file  Occupational History  . Not on file  Tobacco Use  . Smoking status: Current Some Day Smoker    Types: Cigarettes  . Smokeless  tobacco: Never Used  Substance and Sexual Activity  . Alcohol use: No  . Drug use: No  . Sexual activity: Not on file  Other Topics Concern  . Not on file  Social History Narrative  . Not on file   Social Determinants of Health   Financial Resource Strain:   . Difficulty of Paying Living Expenses: Not on file  Food Insecurity:   . Worried About Charity fundraiser in the Last Year: Not on file  . Ran Out of Food in the Last Year: Not on file  Transportation Needs:   . Lack of Transportation (Medical): Not on file  . Lack of Transportation (Non-Medical): Not on file  Physical Activity:   . Days of Exercise per Week: Not on file  . Minutes of Exercise per Session: Not on file  Stress:   . Feeling of  Stress : Not on file  Social Connections:   . Frequency of Communication with Friends and Family: Not on file  . Frequency of Social Gatherings with Friends and Family: Not on file  . Attends Religious Services: Not on file  . Active Member of Clubs or Organizations: Not on file  . Attends Archivist Meetings: Not on file  . Marital Status: Not on file  Intimate Partner Violence:   . Fear of Current or Ex-Partner: Not on file  . Emotionally Abused: Not on file  . Physically Abused: Not on file  . Sexually Abused: Not on file    ROS  PHYSICAL EXAMINATION:    LMP 11/17/2008 (Approximate)     General appearance: alert, cooperative and appears stated age Neck: no adenopathy, supple, symmetrical, trachea midline and thyroid normal to inspection and palpation Heart: regular rate and rhythm Lungs: CTAB Abdomen: soft, non-tender; bowel sounds normal; no masses,  no organomegaly Extremities: normal, atraumatic, no cyanosis Skin: normal color, texture and turgor, no rashes or lesions Lymph: normal cervical supraclavicular and inguinal nodes Neurologic: grossly normal   ASSESSMENT PMP bleeding, small intracavitary defects on sonohysterogram Anemia, getting iron transfusion through her primary On anticoagulation    PLAN Hysteroscopy, polypectomy, D&C Needs preoperative clearance, states she just saw her Cardiologist (will get records)  Will need to stop her coumadin (typically 5 days prior, but will check with Dr Terrence Dupont)   Her daughter [name] was present throughout the visit.

## 2020-08-16 NOTE — Progress Notes (Signed)
Spoke with Kristi Meyers and pt needs clearance from primary medical dr for 09-07-2020 surgery

## 2020-08-16 NOTE — Patient Instructions (Signed)
Hysteroscopy °Hysteroscopy is a procedure that is used to examine the inside of a woman's womb (uterus). This may be done for various reasons, including: °· To look for lumps (tumors) and other growths in the uterus. °· To evaluate abnormal bleeding, fibroid tumors, polyps, scar tissue (adhesions), or cancer of the uterus. °· To determine the cause of an inability to get pregnant (infertility) or repeated losses of pregnancies (miscarriages). °· To find a lost IUD (intrauterine device). °· To perform a procedure that permanently prevents pregnancy (sterilization). °During this procedure, a thin, flexible tube with a small light and camera (hysteroscope) is used to examine the uterus. The camera sends images to a monitor in the room so that your health care provider can view the inside of your uterus. A hysteroscopy should be done right after a menstrual period to make sure that you are not pregnant. °Tell a health care provider about: °· Any allergies you have. °· All medicines you are taking, including vitamins, herbs, eye drops, creams, and over-the-counter medicines. °· Any problems you or family members have had with the use of anesthetic medicines. °· Any blood disorders you have. °· Any surgeries you have had. °· Any medical conditions you have. °· Whether you are pregnant or may be pregnant. °What are the risks? °Generally, this is a safe procedure. However, problems may occur, including: °· Excessive bleeding. °· Infection. °· Damage to the uterus or other structures or organs. °· Allergic reaction to medicines or fluids that are used in the procedure. °What happens before the procedure? °Staying hydrated °Follow instructions from your health care provider about hydration, which may include: °· Up to 2 hours before the procedure - you may continue to drink clear liquids, such as water, clear fruit juice, black coffee, and plain tea. °Eating and drinking restrictions °Follow instructions from your health care  provider about eating and drinking, which may include: °· 8 hours before the procedure - stop eating solid foods and drink clear liquids only °· 2 hours before the procedure - stop drinking clear liquids. °General instructions °· Ask your health care provider about: °? Changing or stopping your normal medicines. This is important if you take diabetes medicines or blood thinners. °? Taking medicines such as aspirin and ibuprofen. These medicines can thin your blood and cause bleeding. Do not take these medicines for 1 week before your procedure, or as told by your health care provider. °· Do not use any products that contain nicotine or tobacco for 2 weeks before the procedure. This includes cigarettes and e-cigarettes. If you need help quitting, ask your health care provider. °· Medicine may be placed in your cervix the day before the procedure. This medicine causes the cervix to have a larger opening (dilate). The larger opening makes it easier for the hysteroscope to be inserted into the uterus during the procedure. °· Plan to have someone with you for the first 24-48 hours after the procedure, especially if you are given a medicine to make you fall asleep (general anesthetic). °· Plan to have someone take you home from the hospital or clinic. °What happens during the procedure? °· To lower your risk of infection: °? Your health care team will wash or sanitize their hands. °? Your skin will be washed with soap. °? Hair may be removed from the surgical area. °· An IV tube will be inserted into one of your veins. °· You may be given one or more of the following: °? A medicine to help   you relax (sedative). °? A medicine that numbs the area around the cervix (local anesthetic). °? A medicine to make you fall asleep (general anesthetic). °· A hysteroscope will be inserted through your vagina and into your uterus. °· Air or fluid will be used to enlarge your uterus, enabling your health care provider to see your uterus  better. The amount of fluid used will be carefully checked throughout the procedure. °· In some cases, tissue may be gently scraped from inside the uterus and sent to a lab for testing (biopsy). °The procedure may vary among health care providers and hospitals. °What happens after the procedure? °· Your blood pressure, heart rate, breathing rate, and blood oxygen level will be monitored until the medicines you were given have worn off. °· You may have some cramping. You may be given medicines for this. °· You may have bleeding, which varies from light spotting to menstrual-like bleeding. This is normal. °· If you had a biopsy done, it is your responsibility to get the results of your procedure. Ask your health care provider, or the department performing the procedure, when your results will be ready. °Summary °· Hysteroscopy is a procedure that is used to examine the inside of a woman's womb (uterus). °· After the procedure, you may have bleeding, which varies from light spotting to menstrual-like bleeding. This is normal. You may also have cramping. °· Plan to have someone take you home from the hospital or clinic. °This information is not intended to replace advice given to you by your health care provider. Make sure you discuss any questions you have with your health care provider. °Document Revised: 09/14/2017 Document Reviewed: 10/31/2016 °Elsevier Patient Education © 2020 [employer] ° °

## 2020-08-16 NOTE — Telephone Encounter (Signed)
Left message to call [place] at [phone].  Spoke with PAT nurse at [facility]. Patient will need surgery clearance with PCP. Need to speak with patient regarding dates she can be seen with PCP so this appointment can be made.

## 2020-08-19 NOTE — Telephone Encounter (Addendum)
Patient was seen in office on 08/16/2020 with Dr.Jertson. Patient has a cardiologist and was recently seen for evaluation. Dr.Jerston is awaiting records for review as this may qualify for surgery clearance.  Routing to provider and will close encounter.

## 2020-08-23 ENCOUNTER — Ambulatory Visit (INDEPENDENT_AMBULATORY_CARE_PROVIDER_SITE_OTHER): Payer: Medicare Other | Admitting: Family Medicine

## 2020-08-23 ENCOUNTER — Other Ambulatory Visit: Payer: Self-pay

## 2020-08-23 ENCOUNTER — Ambulatory Visit: Payer: Medicare Other | Admitting: Family Medicine

## 2020-08-23 ENCOUNTER — Encounter: Payer: Self-pay | Admitting: Family Medicine

## 2020-08-23 VITALS — BP 138/80 | HR 57 | Temp 98.5°F | Wt 198.0 lb

## 2020-08-23 DIAGNOSIS — I714 Abdominal aortic aneurysm, without rupture, unspecified: Secondary | ICD-10-CM

## 2020-08-23 DIAGNOSIS — N950 Postmenopausal bleeding: Secondary | ICD-10-CM

## 2020-08-23 DIAGNOSIS — Z86711 Personal history of pulmonary embolism: Secondary | ICD-10-CM | POA: Diagnosis not present

## 2020-08-23 DIAGNOSIS — D500 Iron deficiency anemia secondary to blood loss (chronic): Secondary | ICD-10-CM | POA: Diagnosis not present

## 2020-08-23 NOTE — Progress Notes (Signed)
Subjective:    Patient ID: Kristi Meyers, female    DOB: Jun 29, 1956, 64 y.o.   MRN: [id number]  No chief complaint on file. Patient accompanied by her daughter.  HPI Pt is a 64 yo female with pmh sig for h/o saddle PE, AAA 3-5 cm, postmenopausal bleeding, fibroid who was seen for f/u.   Pt advised to come to clinic due to her ongoing issues with anemia and to have a transfusion?  Pt has a D&C/hysteroscopy planned with OB/GYN 2/2 history of postmenopausal bleeding.  Bleeding started in Sept.  Pt denies current bleeding.  Pt endorses recent visit with Dr. Terrence Dupont, Cardiology.  On Coumadin for history of saddle PE.  Was previously on Eliquis but stopped 2/2 cost.  Pt states she has been feeling okay since last office visit.  Denies dizziness, chest pain, palpitations.  Endorses occasional SOB.  Denies smoking.  Patient mentions she is scheduled to have labs with cardiology on Friday.  Patient states she does not want to be stuck multiple times for blood draws.   Past Medical History:  Diagnosis Date  . GERD (gastroesophageal reflux disease)   . High cholesterol   . Hypertension   . Vertigo     No Known Allergies  ROS General: Denies fever, chills, night sweats, changes in weight, changes in appetite, smoking HEENT: Denies headaches, ear pain, changes in vision, rhinorrhea, sore throat CV: Denies CP, palpitations, orthopnea  +SOB Pulm: Denies cough, wheezing  +SOB GI: Denies abdominal pain, nausea, vomiting, diarrhea, constipation GU: Denies dysuria, hematuria, frequency, vaginal discharge  +fibroid, h/o postmenopausal bleeding Msk: Denies muscle cramps, joint pains Neuro: Denies weakness, numbness, tingling Skin: Denies rashes, bruising Psych: Denies depression, anxiety, hallucinations     Objective:    Blood pressure 138/80, pulse (!) 57, temperature 98.5 F (36.9 C), temperature source Oral, weight 198 lb (89.8 kg), last menstrual period 11/17/2008, SpO2 98 %.  Gen. Pleasant,  well-nourished, in no distress, normal affect.  Heavy odor of MJ noted in exam room.  HEENT: [place]/AT, face symmetric, conjunctiva clear, no scleral icterus, PERRLA, EOMI, nares patent without drainage Lungs: no accessory muscle use, CTAB, no wheezes or rales Cardiovascular: Bradycardia, no m/r/g, no peripheral edema Musculoskeletal: No deformities, no cyanosis or clubbing, normal tone Neuro:  A&Ox3, CN II-XII intact, sitting in wheelchair Skin:  Warm, no lesions/ rash   Wt Readings from Last 3 Encounters:  08/23/20 198 lb (89.8 kg)  08/16/20 198 lb 6.4 oz (90 kg)  07/20/20 185 lb (83.9 kg)    Lab Results  Component Value Date   WBC 9.2 06/24/2020   HGB 8.0 (L) 06/24/2020   HCT 25.7 (L) 06/24/2020   PLT 368 06/24/2020   GLUCOSE 101 (H) 06/24/2020   ALT 16 06/17/2020   AST 30 06/17/2020   NA 139 06/24/2020   K 3.9 06/24/2020   CL 107 06/24/2020   CREATININE 1.00 (H) 06/24/2020   BUN 15 06/24/2020   CO2 25 06/24/2020   TSH 1.187 12/09/2017   INR 1.3 (H) 06/24/2020    Assessment/Plan:  Iron deficiency anemia due to chronic blood loss  -h/h 8.0/25.7 on 06/24/20  MCV 84.0 -discussed need to recheck CBC and other labs.  Unclear if done with OB/Gyn -pt wishes to wait as having labs with Cardiology in the next few days -given precautions - Plan: CBC (no diff), Iron, TIBC and Ferritin Panel  History of pulmonary embolus (PE) -submassive PE -continue coumadin 5 mg -coumadin will need to be held prior  to procedure. -continue f/u with Cardiology  Abdominal aortic aneurysm (AAA) 3.0 cm to 5.0 cm in diameter in female [facility]) -noted on CT 12/10/2017:  3.1 cm AAA.   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 -Recommended f/u by u/s in 3 yrs  (2022)  Postmenopausal bleeding -causing iron def anemia -likely 2/2 h/o fibroid -continue f/u with OB/Gyn -D/C and hysteroscopy schedule in a few wks. -given precautions  F/u prn  Grier Mitts, MD

## 2020-08-30 ENCOUNTER — Encounter: Payer: Self-pay | Admitting: Family Medicine

## 2020-08-30 ENCOUNTER — Telehealth: Payer: Self-pay | Admitting: *Deleted

## 2020-08-30 DIAGNOSIS — I723 Aneurysm of iliac artery: Secondary | ICD-10-CM | POA: Insufficient documentation

## 2020-08-30 NOTE — Telephone Encounter (Signed)
Spoke with Almyra Free at Dr. Zenia Resides office. Fax request for surgical clearance to [phone].  Request faxed.

## 2020-08-30 NOTE — Telephone Encounter (Signed)
Call received from [place] at [facility].  Patient is scheduled for Hysteroscopy D&C Myosure on 09/07/20 at 0730 w/ Dr. Talbert Nan.  Still need surgical clearance from cardiology and recommendations for managing her coumadin. Advised I will f/u with Dr. Zenia Resides office and f/u.    Call placed to [place] [address] [place], [place] [id number] [phone]  Office closed for lunch, will try again after 12:30pm.

## 2020-08-30 NOTE — Progress Notes (Signed)
Spoke with jill at dr Talbert Nan office and requested cardiac clearance from dr Terrence Dupont as noted office was getting cardiac clearance per dr Gentry Fitz notes

## 2020-08-31 NOTE — Telephone Encounter (Signed)
Fax notification received from Dr. Terrence Dupont for surgical clearance.  OK to stop coumadin 3 days prior to procedure.   Copy faxed to [facility] PAT, Ivin Booty notified.   Spoke with patient, notified of coumadin recommendations. Patient request to return call to office later today to review pre-op instructions and appts.   Per review of Epic, patient was seen by PCP on 08/23/20.  Request sent to PCP -Dr. Volanda Napoleon for surgical clearance.

## 2020-09-01 ENCOUNTER — Encounter (HOSPITAL_BASED_OUTPATIENT_CLINIC_OR_DEPARTMENT_OTHER): Payer: Self-pay | Admitting: Obstetrics and Gynecology

## 2020-09-01 NOTE — Telephone Encounter (Signed)
Spoke with patient. Reviewed recommendations for stopping coumadin prior to surgery. Patient will take her last dose of coumadin on 09/03/20. Patient has a copy of surgery instructions already provided to her and reviewed, we reviewed them once again. Patient ask that this RN contact her daughter, [name] at [phone], to review all of her instructions as well. Questions answered. Patient verbalizes understanding and is agreeable.   Call placed to patients daughter [name]. Reviewed all pre-op instructions, surgery date, time and location. Reviewed recommendations for coumadin. Daughter verbalizes understanding and is agreeable.   Copy of cardiology recommendations to Dr. Talbert Nan.   Dr. Talbert Nan -we have received surgery clearance from cardiology, does patient also need clearance from PCP?

## 2020-09-01 NOTE — Telephone Encounter (Signed)
Kristi Meyers with [name] calling regarding surgery clearance. [phone]

## 2020-09-02 ENCOUNTER — Other Ambulatory Visit (HOSPITAL_COMMUNITY): Payer: Medicare Other

## 2020-09-02 ENCOUNTER — Other Ambulatory Visit: Payer: Self-pay

## 2020-09-02 ENCOUNTER — Encounter (HOSPITAL_BASED_OUTPATIENT_CLINIC_OR_DEPARTMENT_OTHER): Payer: Self-pay | Admitting: Obstetrics and Gynecology

## 2020-09-02 ENCOUNTER — Telehealth: Payer: Self-pay | Admitting: Family Medicine

## 2020-09-02 NOTE — Telephone Encounter (Signed)
Sharee Pimple is returning the call regarding patient getting surgical clearance, please. CB is [phone]

## 2020-09-02 NOTE — Telephone Encounter (Signed)
Called Kristi Meyers with [facility], advised that they did not need any further information regarding pt since pt cardiologist completed pt surgical clearance forms

## 2020-09-02 NOTE — Telephone Encounter (Signed)
Spoke with Izora Gala at [facility] Advised can cancel request for surgical clearance per Dr. Talbert Nan. Coumadin recommendations provided by cardiology.   Routing to provider for final review. Will close encounter.

## 2020-09-02 NOTE — Telephone Encounter (Signed)
Reviewed with Dr. Talbert Nan. Patient does not need surgical clearance from PCP, only cardiology.   Call returned to [place] at [place], Left message to call Sharee Pimple, RN at [facility].

## 2020-09-02 NOTE — Progress Notes (Signed)
Spoke w/ via phone for pre-op interview---pt Lab needs dos----pt,. Has lab appt 09-03-2020 1300 pm for cbc, cmet, ferritin and t & s               COVID test ------09-03-2020 255 pm Arrive at -------530 am 09-07-2020 NPO after MN NO Solid Food.  Clear liquids from MN until---430 am then npo Medications to take morning of surgery -----carvedilol, meclizine Diabetic medication -----n/a Patient Special Instructions -----none Pre-Op special Istructions -----none Patient verbalized understanding of instructions that were given at this phone interview. Patient denies shortness of breath, chest pain, fever, cough at this phone interview.  Anesthesia Review: no  PCP: shannon banks Cardiologist : dr Philis Kendall 08-11-2020 on chart Chest x-ray :06-17-2020 epic EKG :06-17-2020 epic, 10-22-2019 dr Terrence Dupont on chart Echo :08-08-2019 dr Terrence Dupont on chart Stress test:05-16-2019 dr Terrence Dupont on chart Cardiac Cath : none Activity level: can climb stairs withour problems, does own housework Sleep Study/ CPAP :n/a Fasting Blood Sugar :      / Checks Blood Sugar -- times a day:  n/a Blood Thinner/ Instructions /Last Dose:stop warfarin last dose 09-03-2020 per dr Terrence Dupont note on chart, pt aware ASA / Instructions/ Last Dose : n/a

## 2020-09-03 ENCOUNTER — Other Ambulatory Visit (HOSPITAL_COMMUNITY)
Admission: RE | Admit: 2020-09-03 | Discharge: 2020-09-03 | Disposition: A | Discharge status: Home / Self Care | Payer: Medicare Other | Source: Ambulatory Visit | Attending: Obstetrics and Gynecology | Admitting: Obstetrics and Gynecology

## 2020-09-03 ENCOUNTER — Encounter (HOSPITAL_COMMUNITY)
Admission: RE | Admit: 2020-09-03 | Discharge: 2020-09-03 | Disposition: A | Discharge status: Home / Self Care | Payer: Medicare Other | Source: Ambulatory Visit | Attending: Obstetrics and Gynecology | Admitting: Obstetrics and Gynecology

## 2020-09-03 ENCOUNTER — Emergency Department (HOSPITAL_COMMUNITY)
Admission: EM | Admit: 2020-09-03 | Discharge: 2020-09-03 | Discharge status: Absent without leave | Payer: Medicare Other

## 2020-09-03 DIAGNOSIS — Z20822 Contact with and (suspected) exposure to covid-19: Secondary | ICD-10-CM | POA: Insufficient documentation

## 2020-09-03 DIAGNOSIS — Z01812 Encounter for preprocedural laboratory examination: Secondary | ICD-10-CM | POA: Insufficient documentation

## 2020-09-03 LAB — CBC
HCT: 34.7 % — ABNORMAL LOW (ref 36.0–46.0)
Hemoglobin: 10.5 g/dL — ABNORMAL LOW (ref 12.0–15.0)
MCH: 26.1 pg (ref 26.0–34.0)
MCHC: 30.3 g/dL (ref 30.0–36.0)
MCV: 86.1 fL (ref 80.0–100.0)
Platelets: 289 10*3/uL (ref 150–400)
RBC: 4.03 MIL/uL (ref 3.87–5.11)
RDW: 16.8 % — ABNORMAL HIGH (ref 11.5–15.5)
WBC: 8.1 10*3/uL (ref 4.0–10.5)
nRBC: 0 % (ref 0.0–0.2)

## 2020-09-03 LAB — COMPREHENSIVE METABOLIC PANEL
ALT: 15 U/L (ref 0–44)
AST: 14 U/L — ABNORMAL LOW (ref 15–41)
Albumin: 3.6 g/dL (ref 3.5–5.0)
Alkaline Phosphatase: 68 U/L (ref 38–126)
Anion gap: 7 (ref 5–15)
BUN: 16 mg/dL (ref 8–23)
CO2: 26 mmol/L (ref 22–32)
Calcium: 8.9 mg/dL (ref 8.9–10.3)
Chloride: 108 mmol/L (ref 98–111)
Creatinine, Ser: 1.01 mg/dL — ABNORMAL HIGH (ref 0.44–1.00)
GFR, Estimated: >60 mL/min (ref >60)
Glucose, Bld: 94 mg/dL (ref 70–99)
Potassium: 4.3 mmol/L (ref 3.5–5.1)
Sodium: 141 mmol/L (ref 135–145)
Total Bilirubin: 0.5 mg/dL (ref 0.3–1.2)
Total Protein: 7.4 g/dL (ref 6.5–8.1)

## 2020-09-03 LAB — FERRITIN: Ferritin: 42 ng/mL (ref 11–307)

## 2020-09-03 LAB — SARS CORONAVIRUS 2 (TAT 6-24 HRS): SARS Coronavirus 2: NEGATIVE

## 2020-09-06 NOTE — Anesthesia Preprocedure Evaluation (Addendum)
Anesthesia Evaluation  Patient identified by MRN, date of birth, ID band Patient awake    Reviewed: Allergy & Precautions, NPO status , Patient's Chart, lab work & pertinent test results  Airway Mallampati: II  TM Distance: >3 FB Neck ROM: Full    Dental  (+) Edentulous Upper, Edentulous Lower   Pulmonary former smoker, PE   Pulmonary exam normal breath sounds clear to auscultation       Cardiovascular hypertension, Pt. on medications and Pt. on home beta blockers + DVT  Normal cardiovascular exam Rhythm:Regular Rate:Normal  ECG: SR, rate 67   Neuro/Psych  Headaches, Vertigo negative psych ROS   GI/Hepatic negative GI ROS, Neg liver ROS,   Endo/Other  negative endocrine ROS  Renal/GU negative Renal ROS     Musculoskeletal  (+) Arthritis ,   Abdominal (+) + obese,   Peds  Hematology  (+) anemia ,   Anesthesia Other Findings Postmenopausal bleeding, endometrial polyp  Reproductive/Obstetrics                            Anesthesia Physical Anesthesia Plan  ASA: II  Anesthesia Plan: General   Post-op Pain Management:    Induction: Intravenous  PONV Risk Score and Plan: 4 or greater and Ondansetron, Dexamethasone, Midazolam and Treatment may vary due to age or medical condition  Airway Management Planned: LMA  Additional Equipment:   Intra-op Plan:   Post-operative Plan: Extubation in OR  Informed Consent: I have reviewed the patients History and Physical, chart, labs and discussed the procedure including the risks, benefits and alternatives for the proposed anesthesia with the patient or authorized representative who has indicated his/her understanding and acceptance.     Dental advisory given  Plan Discussed with: CRNA  Anesthesia Plan Comments:         Anesthesia Quick Evaluation

## 2020-09-07 ENCOUNTER — Ambulatory Visit (HOSPITAL_BASED_OUTPATIENT_CLINIC_OR_DEPARTMENT_OTHER): Payer: Medicare Other | Admitting: Anesthesiology

## 2020-09-07 ENCOUNTER — Ambulatory Visit (HOSPITAL_BASED_OUTPATIENT_CLINIC_OR_DEPARTMENT_OTHER)
Admission: RE | Admit: 2020-09-07 | Discharge: 2020-09-07 | Disposition: A | Discharge status: Home / Self Care | Payer: Medicare Other | Attending: Obstetrics and Gynecology | Admitting: Obstetrics and Gynecology

## 2020-09-07 ENCOUNTER — Encounter (HOSPITAL_BASED_OUTPATIENT_CLINIC_OR_DEPARTMENT_OTHER)
Admission: RE | Disposition: A | Discharge status: Home / Self Care | Payer: Self-pay | Source: Home / Self Care | Attending: Obstetrics and Gynecology

## 2020-09-07 ENCOUNTER — Other Ambulatory Visit: Payer: Self-pay

## 2020-09-07 ENCOUNTER — Encounter (HOSPITAL_BASED_OUTPATIENT_CLINIC_OR_DEPARTMENT_OTHER): Payer: Self-pay | Admitting: Obstetrics and Gynecology

## 2020-09-07 DIAGNOSIS — Z86711 Personal history of pulmonary embolism: Secondary | ICD-10-CM | POA: Insufficient documentation

## 2020-09-07 DIAGNOSIS — K219 Gastro-esophageal reflux disease without esophagitis: Secondary | ICD-10-CM | POA: Insufficient documentation

## 2020-09-07 DIAGNOSIS — D649 Anemia, unspecified: Secondary | ICD-10-CM | POA: Insufficient documentation

## 2020-09-07 DIAGNOSIS — I10 Essential (primary) hypertension: Secondary | ICD-10-CM | POA: Diagnosis not present

## 2020-09-07 DIAGNOSIS — N950 Postmenopausal bleeding: Secondary | ICD-10-CM

## 2020-09-07 DIAGNOSIS — F17210 Nicotine dependence, cigarettes, uncomplicated: Secondary | ICD-10-CM | POA: Insufficient documentation

## 2020-09-07 DIAGNOSIS — Z6832 Body mass index (BMI) 32.0-32.9, adult: Secondary | ICD-10-CM | POA: Insufficient documentation

## 2020-09-07 DIAGNOSIS — I714 Abdominal aortic aneurysm, without rupture: Secondary | ICD-10-CM | POA: Insufficient documentation

## 2020-09-07 DIAGNOSIS — Z7901 Long term (current) use of anticoagulants: Secondary | ICD-10-CM | POA: Insufficient documentation

## 2020-09-07 DIAGNOSIS — Z79899 Other long term (current) drug therapy: Secondary | ICD-10-CM | POA: Insufficient documentation

## 2020-09-07 DIAGNOSIS — N840 Polyp of corpus uteri: Secondary | ICD-10-CM | POA: Diagnosis not present

## 2020-09-07 DIAGNOSIS — E669 Obesity, unspecified: Secondary | ICD-10-CM | POA: Diagnosis not present

## 2020-09-07 HISTORY — DX: Abnormal electrocardiogram (ECG) (EKG): R94.31

## 2020-09-07 HISTORY — DX: Migraine, unspecified, not intractable, without status migrainosus: G43.909

## 2020-09-07 HISTORY — DX: Angina pectoris, unspecified: I20.9

## 2020-09-07 HISTORY — DX: Postmenopausal bleeding: N95.0

## 2020-09-07 HISTORY — DX: Cardiac murmur, unspecified: R01.1

## 2020-09-07 HISTORY — PX: DILATATION & CURETTAGE/HYSTEROSCOPY WITH MYOSURE: SHX6511

## 2020-09-07 LAB — PROTIME-INR
INR: 1.5 — ABNORMAL HIGH (ref 0.8–1.2)
Prothrombin Time: 17.2 seconds — ABNORMAL HIGH (ref 11.4–15.2)

## 2020-09-07 LAB — TYPE AND SCREEN
ABO/RH(D): B POS
Antibody Screen: NEGATIVE

## 2020-09-07 SURGERY — DILATATION & CURETTAGE/HYSTEROSCOPY WITH MYOSURE
Anesthesia: General

## 2020-09-07 MED ORDER — ACETAMINOPHEN 500 MG PO TABS
ORAL_TABLET | ORAL | Status: AC
  Filled 2020-09-07: qty 2

## 2020-09-07 MED ORDER — KETOROLAC TROMETHAMINE 15 MG/ML IJ SOLN: 15.0000 mg | Freq: Once | INTRAMUSCULAR | Status: DC | PRN

## 2020-09-07 MED ORDER — DEXAMETHASONE SODIUM PHOSPHATE 10 MG/ML IJ SOLN
INTRAMUSCULAR | Status: DC | PRN
Start: 2020-09-07 — End: 2020-09-07
  Administered 2020-09-07: 5 mg via INTRAVENOUS

## 2020-09-07 MED ORDER — POVIDONE-IODINE 10 % EX SWAB: 2.0000 "application " | Freq: Once | CUTANEOUS | Status: DC

## 2020-09-07 MED ORDER — LACTATED RINGERS IV SOLN: INTRAVENOUS | Status: DC

## 2020-09-07 MED ORDER — LIDOCAINE 2% (20 MG/ML) 5 ML SYRINGE
INTRAMUSCULAR | Status: DC | PRN
  Administered 2020-09-07: 60 mg via INTRAVENOUS

## 2020-09-07 MED ORDER — FENTANYL CITRATE (PF) 100 MCG/2ML IJ SOLN
INTRAMUSCULAR | Status: DC | PRN
  Administered 2020-09-07: 50 ug via INTRAVENOUS

## 2020-09-07 MED ORDER — DEXAMETHASONE SODIUM PHOSPHATE 10 MG/ML IJ SOLN
INTRAMUSCULAR | Status: AC
  Filled 2020-09-07: qty 1

## 2020-09-07 MED ORDER — ONDANSETRON HCL 4 MG/2ML IJ SOLN
INTRAMUSCULAR | Status: DC | PRN
  Administered 2020-09-07: 4 mg via INTRAVENOUS

## 2020-09-07 MED ORDER — ONDANSETRON HCL 4 MG/2ML IJ SOLN
INTRAMUSCULAR | Status: AC
  Filled 2020-09-07: qty 2

## 2020-09-07 MED ORDER — ONDANSETRON HCL 4 MG/2ML IJ SOLN: 4.0000 mg | Freq: Once | INTRAMUSCULAR | Status: DC | PRN

## 2020-09-07 MED ORDER — MIDAZOLAM HCL 2 MG/2ML IJ SOLN
INTRAMUSCULAR | Status: AC
  Filled 2020-09-07: qty 2

## 2020-09-07 MED ORDER — PROPOFOL 10 MG/ML IV BOLUS
INTRAVENOUS | Status: AC
  Filled 2020-09-07: qty 20

## 2020-09-07 MED ORDER — PHENYLEPHRINE 40 MCG/ML (10ML) SYRINGE FOR IV PUSH (FOR BLOOD PRESSURE SUPPORT)
PREFILLED_SYRINGE | INTRAVENOUS | Status: AC
  Filled 2020-09-07: qty 10

## 2020-09-07 MED ORDER — MIDAZOLAM HCL 5 MG/5ML IJ SOLN
INTRAMUSCULAR | Status: DC | PRN
Start: 2020-09-07 — End: 2020-09-07
  Administered 2020-09-07: 1 mg via INTRAVENOUS

## 2020-09-07 MED ORDER — LIDOCAINE HCL (PF) 2 % IJ SOLN
INTRAMUSCULAR | Status: AC
  Filled 2020-09-07: qty 5

## 2020-09-07 MED ORDER — FENTANYL CITRATE (PF) 100 MCG/2ML IJ SOLN
INTRAMUSCULAR | Status: AC
  Filled 2020-09-07: qty 2

## 2020-09-07 MED ORDER — PROPOFOL 10 MG/ML IV BOLUS
INTRAVENOUS | Status: DC | PRN
  Administered 2020-09-07: 200 mg via INTRAVENOUS

## 2020-09-07 MED ORDER — PHENYLEPHRINE 40 MCG/ML (10ML) SYRINGE FOR IV PUSH (FOR BLOOD PRESSURE SUPPORT)
PREFILLED_SYRINGE | INTRAVENOUS | Status: DC | PRN
  Administered 2020-09-07 (×2): 80 ug via INTRAVENOUS
  Administered 2020-09-07: 120 ug via INTRAVENOUS

## 2020-09-07 MED ORDER — ACETAMINOPHEN 500 MG PO TABS
1000.0000 mg | ORAL_TABLET | Freq: Once | ORAL | Status: AC
  Administered 2020-09-07: 1000 mg via ORAL

## 2020-09-07 MED ORDER — FENTANYL CITRATE (PF) 100 MCG/2ML IJ SOLN: 25.0000 ug | INTRAMUSCULAR | Status: DC | PRN

## 2020-09-07 SURGICAL SUPPLY — 13 items
CATH ROBINSON RED A/P 16FR (CATHETERS) ×2 IMPLANT
DEVICE MYOSURE LITE (MISCELLANEOUS) ×2 IMPLANT
GLOVE BIO SURGEON STRL SZ 6.5 (GLOVE) ×2 IMPLANT
GLOVE BIO SURGEONS STRL SZ 6.5 (GLOVE) ×1
GOWN STRL REUS W/TWL LRG LVL3 (GOWN DISPOSABLE) ×3 IMPLANT
IV NS IRRIG 3000ML ARTHROMATIC (IV SOLUTION) ×3 IMPLANT
KIT PROCEDURE FLUENT (KITS) ×3 IMPLANT
KIT TURNOVER CYSTO (KITS) ×3 IMPLANT
PACK VAGINAL MINOR WOMEN LF (CUSTOM PROCEDURE TRAY) ×3 IMPLANT
PAD OB MATERNITY 4.3X12.25 (PERSONAL CARE ITEMS) ×3 IMPLANT
PAD PREP 24X48 CUFFED NSTRL (MISCELLANEOUS) ×3 IMPLANT
SEAL ROD LENS SCOPE MYOSURE (ABLATOR) ×3 IMPLANT
TOWEL OR 17X26 10 PK STRL BLUE (TOWEL DISPOSABLE) ×3 IMPLANT

## 2020-09-07 NOTE — Anesthesia Postprocedure Evaluation (Signed)
Anesthesia Post Note  Patient: Kristi Meyers  Procedure(s) Performed: DILATATION & CURETTAGE/HYSTEROSCOPY WITH MYOSURE (N/A )     Patient location during evaluation: PACU Anesthesia Type: General Level of consciousness: awake and alert Pain management: pain level controlled Vital Signs Assessment: post-procedure vital signs reviewed and stable Respiratory status: spontaneous breathing, nonlabored ventilation, respiratory function stable and patient connected to nasal cannula oxygen Cardiovascular status: blood pressure returned to baseline and stable Postop Assessment: no apparent nausea or vomiting Anesthetic complications: no   No complications documented.  Last Vitals:  Vitals:   09/07/20 0845 09/07/20 0915  BP: 110/81 133/89  Pulse: 70 73  Resp: 14 16  Temp: (!) 36.4 C 36.4 C  SpO2: 98% 97%    Last Pain:  Vitals:   09/07/20 0915  TempSrc:   PainSc: 0-No pain                 Coley Littles P Eladio [place]remont

## 2020-09-07 NOTE — Discharge Instructions (Signed)
DISCHARGE INSTRUCTIONS: HYSTEROSCOPY / ENDOMETRIAL ABLATION The following instructions have been prepared to help you care for yourself upon your return home.  May take stool softner while taking narcotic pain medication to prevent constipation.  Drink plenty of water.  Personal hygiene:  Use sanitary pads for vaginal drainage, not tampons.  Shower the day after your procedure.  NO tub baths, pools or Jacuzzis for 2-3 weeks.  Wipe front to back after using the bathroom.  Activity and limitations:  Do NOT drive or operate any equipment for 24 hours. The effects of anesthesia are still present and drowsiness may result.  Do NOT rest in bed all day.  Walking is encouraged.  Walk up and down stairs slowly.  You may resume your normal activity in one to two days or as indicated by your physician. Sexual activity: NO intercourse for at least 2 weeks after the procedure, or as indicated by your Doctor.  Diet: Eat a light meal as desired this evening. You may resume your usual diet tomorrow.  Return to Work: You may resume your work activities in one to two days or as indicated by your Doctor.  What to expect after your surgery: Expect to have vaginal bleeding/discharge for 2-3 days and spotting for up to 10 days. It is not unusual to have soreness for up to 1-2 weeks. You may have a slight burning sensation when you urinate for the first day. Mild cramps may continue for a couple of days. You may have a regular period in 2-6 weeks.  Call your doctor for any of the following:  Excessive vaginal bleeding or clotting, saturating and changing one pad every hour.  Inability to urinate 6 hours after discharge from hospital.  Pain not relieved by pain medication.  Fever of 100.4 F or greater.  Unusual vaginal discharge or odor.   Post Anesthesia Home Care Instructions  Activity: Get plenty of rest for the remainder of the day. A responsible individual must stay with you for 24 hours  following the procedure.  For the next 24 hours, DO NOT: -Drive a car -Operate machinery -Drink alcoholic beverages -Take any medication unless instructed by your physician -Make any legal decisions or sign important papers.  Meals: Start with liquid foods such as gelatin or soup. Progress to regular foods as tolerated. Avoid greasy, spicy, heavy foods. If nausea and/or vomiting occur, drink only clear liquids until the nausea and/or vomiting subsides. Call your physician if vomiting continues.  Special Instructions/Symptoms: Your throat may feel dry or sore from the anesthesia or the breathing tube placed in your throat during surgery. If this causes discomfort, gargle with warm salt water. The discomfort should disappear within 24 hours.       

## 2020-09-07 NOTE — Anesthesia Procedure Notes (Signed)
Procedure Name: LMA Insertion Date/Time: 09/07/2020 7:35 AM Performed by: Rogers Blocker, CRNA Pre-anesthesia Checklist: Patient identified, Emergency Drugs available, Suction available and Patient being monitored Patient Re-evaluated:Patient Re-evaluated prior to induction Oxygen Delivery Method: Circle System Utilized Preoxygenation: Pre-oxygenation with 100% oxygen Induction Type: IV induction Ventilation: Mask ventilation without difficulty LMA: LMA inserted LMA Size: 4.0 Number of attempts: 1 Placement Confirmation: positive ETCO2 Tube secured with: Tape Dental Injury: Teeth and Oropharynx as per pre-operative assessment

## 2020-09-07 NOTE — Op Note (Signed)
Preoperative Diagnosis: postmenopausal bleeding  Postoperative Diagnosis: postmenopausal bleeding, endometrial polyp  Procedure: Hysteroscopy, polypectomy, dilation and curettage  Surgeon: Dr Sumner Boast  Assistants: None  Anesthesia: General via LMA  EBL: 5 cc  Fluids: 700 cc LR  Fluid deficit: 200 cc  Urine output: 30 cc  Indications for surgery: The patient is a 64 yo female, who presented with heavy postmenopausal bleeding while on anticoagulation for a history of pulmonary embolism. Work up included a normal pap, endometrial biopsy with benign endometrial polyp, and sonohysterogram with 2 small posterior uterine wall defects concerning for endometrial polyps.  The risks of the surgery were reviewed with the patient and the consent form was signed prior to her surgery.  Findings: EUA: normal sized anteverted uterus, no adnexal masses. Hysteroscopy: 3 small endometrial polyps, otherwise thin endometrium, normal tubal ostia bilaterally.   Specimens: endometrial polyps, endometrial curettings.    Procedure: The patient was taken to the operating room with an IV in place. She was placed in the dorsal lithotomy position and anesthesia was administered. She was prepped and draped in the usual sterile fashion for a vaginal procedure. She was in and out catheterized. A weighted speculum was placed in the vagina and a single tooth tenaculum was placed on the anterior lip of the cervix. The cervix was dilated to a #21 Pratt dilator. The uterus was sounded to 8 cm. The myosure hysteroscope was inserted into the uterine cavity. With continuous infusion of normal saline, the uterine cavity was visualized with the above findings. The myosure light was used to resect the polyps. The myosure was then removed. The cavity was then curetted with the small sharp curette. The cavity had the characteristically gritty texture at the end of the procedure. The curette and the single tooth tenaculum were  removed.  The speculum was removed. The patients perineum was cleansed of betadine and she was taken out of the dorsal lithotomy position.  Upon awakening the LMA was removed and the patient was transferred to the recovery room in stable and awake condition.  The sponge and instrument count were correct. There were no complications.   CC: Dr Grier Mitts, Dr Terrence Dupont

## 2020-09-07 NOTE — Interval H&P Note (Signed)
History and Physical Interval Note:  09/07/2020 7:06 AM  Kristi Meyers  has presented today for surgery, with the diagnosis of Postmenopausal bleeding, endometrial polyp.  The various methods of treatment have been discussed with the patient and family. After consideration of risks, benefits and other options for treatment, the patient has consented to  Procedure(s): [place] (N/A) as a surgical intervention.  The patient's history has been reviewed, patient examined, no change in status, stable for surgery.  I have reviewed the patient's chart and labs.  Questions were answered to the patient's satisfaction.    Per Cardiologists recommendation the patient stopped her coumadin 3 days preoperatively.   Salvadore Dom

## 2020-09-07 NOTE — Transfer of Care (Signed)
Immediate Anesthesia Transfer of Care Note  Patient: Kristi Meyers  Procedure(s) Performed: DILATATION & CURETTAGE/HYSTEROSCOPY WITH MYOSURE (N/A )  Patient Location: PACU  Anesthesia Type:General  Level of Consciousness: awake, alert , oriented and patient cooperative  Airway & Oxygen Therapy: Patient Spontanous Breathing  Post-op Assessment: Report given to RN and Post -op Vital signs reviewed and stable  Post vital signs: Reviewed and stable  Last Vitals:  Vitals Value Taken Time  BP    Temp    Pulse 73 09/07/20 0802  Resp 14 09/07/20 0802  SpO2 94 % 09/07/20 0802  Vitals shown include unvalidated device data.  Last Pain:  Vitals:   09/07/20 0603  TempSrc: Oral  PainSc: 0-No pain      Patients Stated Pain Goal: 8 (09/40/76 8088)  Complications: No complications documented.

## 2020-09-08 ENCOUNTER — Encounter (HOSPITAL_BASED_OUTPATIENT_CLINIC_OR_DEPARTMENT_OTHER): Payer: Self-pay | Admitting: Obstetrics and Gynecology

## 2020-09-08 LAB — SURGICAL PATHOLOGY

## 2020-09-20 ENCOUNTER — Encounter: Payer: Self-pay | Admitting: Obstetrics and Gynecology

## 2020-09-20 ENCOUNTER — Ambulatory Visit (INDEPENDENT_AMBULATORY_CARE_PROVIDER_SITE_OTHER): Payer: Medicare Other | Admitting: Obstetrics and Gynecology

## 2020-09-20 ENCOUNTER — Other Ambulatory Visit: Payer: Self-pay

## 2020-09-20 VITALS — BP 110/72 | HR 66 | Resp 100 | Ht 66.0 in | Wt 194.0 lb

## 2020-09-20 DIAGNOSIS — Z9889 Other specified postprocedural states: Secondary | ICD-10-CM

## 2020-09-20 NOTE — Progress Notes (Signed)
GYNECOLOGY  VISIT   HPI: 64 y.o.   Single Black or African American Not Hispanic or Latino  female   No obstetric history on file. with Patient's last menstrual period was 11/17/2008 (approximate).   here for 2 week post op. Patient states that she has had pain in her stomach, reflux type discomfort up to an 8-9/10 in severity. She states that it starts usually after she eats and it will make her out of breath.  She says that is out of the tramadol.   No lower abdominal discomfort, no further bleeding.   GYNECOLOGIC HISTORY: Patient's last menstrual period was 11/17/2008 (approximate). Contraception:none Menopausal hormone therapy: none        OB History   No obstetric history on file.        Patient Active Problem List   Diagnosis Date Noted  . 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 Postmenopausal bleeding 07/01/2020  . Fibroid 07/01/2020  . History of pulmonary embolus (PE) 07/01/2020  . Primary localized osteoarthritis of right knee 03/14/2019  . Abdominal aortic aneurysm (AAA) 3.0 cm to 5.0 cm in diameter in female ([place]) 02/18/2018  . Shock ([place]) 12/15/2017  . Elevated troponin 12/15/2017  . AKI (acute kidney injury) ([place]) 12/15/2017  . E. coli UTI 12/15/2017  . Respiratory distress   . Acute saddle pulmonary embolus ([place]) 12/10/2017    Past Medical History:  Diagnosis Date  . Abnormal EKG    hx of left bundle branch block   . Anginal pain ([facility])    stable  . GERD (gastroesophageal reflux disease)   . Heart murmur    soft systolic per dr Terrence Dupont [id number] lov  . High cholesterol   . Hypertension   . Left leg DVT ([place]) 11/2017   pulmonary embolus  . Migraine   . PMB (postmenopausal bleeding)   . Vertigo     Past Surgical History:  Procedure Laterality Date  . CHOLECYSTECTOMY  2011  . DILATATION & CURETTAGE/HYSTEROSCOPY WITH MYOSURE N/A 09/07/2020   Procedure: DILATATION & CURETTAGE/HYSTEROSCOPY WITH MYOSURE;  Surgeon: Salvadore Dom,  MD;  Location: [facility];  Service: Gynecology;  Laterality: N/A;  . IR ANGIOGRAM PULMONARY BILATERAL SELECTIVE  12/10/2017  . IR ANGIOGRAM SELECTIVE EACH ADDITIONAL VESSEL  12/10/2017  . IR ANGIOGRAM SELECTIVE EACH ADDITIONAL VESSEL  12/10/2017  . IR INFUSION THROMBOL ARTERIAL INITIAL (MS)  12/10/2017  . IR INFUSION THROMBOL ARTERIAL INITIAL (MS)  12/10/2017  . IR THROMB F/U EVAL ART/VEN FINAL DAY ([facility])  12/11/2017  . IR US GUIDE VASC ACCESS RIGHT  12/10/2017    Current Outpatient Medications  Medication Sig Dispense Refill  . acetaminophen (TYLENOL) 500 MG tablet Take 500 mg by mouth every 6 (six) hours as needed.    Marland Kitchen AMLODIPINE BESYLATE PO Take 5 mg by mouth every evening.     Marland Kitchen atorvastatin (LIPITOR) 40 MG tablet Take 40 mg by mouth at bedtime.     . carvedilol (COREG) 25 MG tablet Take 25 mg by mouth 2 (two) times daily with a meal.    . losartan (COZAAR) 50 MG tablet Take 50 mg by mouth daily.    . meclizine (ANTIVERT) 12.5 MG tablet Take 12.5 mg by mouth 2 (two) times daily.    Marland Kitchen POTASSIUM CHLORIDE ER PO Take 20 mg by mouth daily.    Marland Kitchen warfarin (COUMADIN) 5 MG tablet Take 5 mg by mouth daily.    . traMADol (ULTRAM) 50 MG tablet Take 1  tablet (50 mg total) by mouth every 8 (eight) hours as needed for severe pain. (Patient not taking: Reported on 09/20/2020) 10 tablet 0   No current facility-administered medications for this visit.     ALLERGIES: Patient has no known allergies.  No family history on file.  Social History   Socioeconomic History  . Marital status: Single    Spouse name: Not on file  . Number of children: Not on file  . Years of education: Not on file  . Highest education level: Not on file  Occupational History  . Not on file  Tobacco Use  . Smoking status: Former Smoker    Packs/day: 0.25    Years: 20.00    Pack years: 5.00    Types: Cigarettes    Quit date: 10/16/2017    Years since quitting: 2.9  . Smokeless tobacco: Never Used  Vaping  Use  . Vaping Use: Never used  Substance and Sexual Activity  . Alcohol use: No  . Drug use: Yes    Types: Marijuana    Comment: occ marijuana last used sept 2021  . Sexual activity: Not on file  Other Topics Concern  . Not on file  Social History Narrative  . Not on file   Social Determinants of Health   Financial Resource Strain:   . Difficulty of Paying Living Expenses: Not on file  Food Insecurity:   . Worried About Charity fundraiser in the Last Year: Not on file  . Ran Out of Food in the Last Year: Not on file  Transportation Needs:   . Lack of Transportation (Medical): Not on file  . Lack of Transportation (Non-Medical): Not on file  Physical Activity:   . Days of Exercise per Week: Not on file  . Minutes of Exercise per Session: Not on file  Stress:   . Feeling of Stress : Not on file  Social Connections:   . Frequency of Communication with Friends and Family: Not on file  . Frequency of Social Gatherings with Friends and Family: Not on file  . Attends Religious Services: Not on file  . Active Member of Clubs or Organizations: Not on file  . Attends Archivist Meetings: Not on file  . Marital Status: Not on file  Intimate Partner Violence:   . Fear of Current or Ex-Partner: Not on file  . Emotionally Abused: Not on file  . Physically Abused: Not on file  . Sexually Abused: Not on file    Review of Systems  Gastrointestinal: Positive for abdominal pain.  All other systems reviewed and are negative.   PHYSICAL EXAMINATION:    BP 110/72   Pulse 66   Resp (!) 100   Ht 5\' 6"  (1.676 m)   Wt 194 lb (88 kg)   LMP 11/17/2008 (Approximate)   BMI 31.31 kg/m     General appearance: alert, cooperative and appears stated age  Abdomen: soft, non-tender; non distended, no masses,  no organomegaly  Reviewed hysteroscopy images and pathology report with the patient.   ASSESSMENT 2 weeks s/p hysteroscopy, polypectomy, D&C with benign pathology Reflux,  upper abdominal pain. She will f/u with her primary for this.     PLAN Routine f/u

## 2020-11-10 DIAGNOSIS — I639 Cerebral infarction, unspecified: Secondary | ICD-10-CM | POA: Diagnosis not present

## 2020-11-10 DIAGNOSIS — I208 Other forms of angina pectoris: Secondary | ICD-10-CM | POA: Diagnosis not present

## 2020-11-10 DIAGNOSIS — I82402 Acute embolism and thrombosis of unspecified deep veins of left lower extremity: Secondary | ICD-10-CM | POA: Diagnosis not present

## 2020-11-10 DIAGNOSIS — I10 Essential (primary) hypertension: Secondary | ICD-10-CM | POA: Diagnosis not present

## 2020-11-10 DIAGNOSIS — I2699 Other pulmonary embolism without acute cor pulmonale: Secondary | ICD-10-CM | POA: Diagnosis not present

## 2020-11-10 DIAGNOSIS — E785 Hyperlipidemia, unspecified: Secondary | ICD-10-CM | POA: Diagnosis not present

## 2020-11-10 DIAGNOSIS — Z7901 Long term (current) use of anticoagulants: Secondary | ICD-10-CM | POA: Diagnosis not present

## 2020-11-12 ENCOUNTER — Telehealth: Payer: Self-pay | Admitting: Family Medicine

## 2020-11-12 NOTE — Telephone Encounter (Signed)
Left message for patient to call back and schedule Medicare Annual Wellness Visit (AWV) either virtually or in office.   Last AWV no information please schedule at anytime with [facility] Nurse Health Advisor 1 or 2   This should be a 45 minute visit. 

## 2021-01-05 ENCOUNTER — Telehealth: Payer: Self-pay | Admitting: Family Medicine

## 2021-01-05 NOTE — Telephone Encounter (Signed)
Left message for patient to call back and schedule Medicare Annual Wellness Visit (AWV) either virtually or in office. No detailed message left   AWVI  please schedule at anytime with [facility] Nurse Health Advisor 1 or 2   This should be a 45 minute visit. 

## 2021-01-14 DIAGNOSIS — Z7901 Long term (current) use of anticoagulants: Secondary | ICD-10-CM | POA: Diagnosis not present

## 2021-02-11 DIAGNOSIS — I10 Essential (primary) hypertension: Secondary | ICD-10-CM | POA: Diagnosis not present

## 2021-02-11 DIAGNOSIS — I82402 Acute embolism and thrombosis of unspecified deep veins of left lower extremity: Secondary | ICD-10-CM | POA: Diagnosis not present

## 2021-02-11 DIAGNOSIS — I639 Cerebral infarction, unspecified: Secondary | ICD-10-CM | POA: Diagnosis not present

## 2021-02-11 DIAGNOSIS — I2699 Other pulmonary embolism without acute cor pulmonale: Secondary | ICD-10-CM | POA: Diagnosis not present

## 2021-02-11 DIAGNOSIS — I208 Other forms of angina pectoris: Secondary | ICD-10-CM | POA: Diagnosis not present

## 2021-02-11 DIAGNOSIS — E785 Hyperlipidemia, unspecified: Secondary | ICD-10-CM | POA: Diagnosis not present

## 2021-02-11 DIAGNOSIS — Z7901 Long term (current) use of anticoagulants: Secondary | ICD-10-CM | POA: Diagnosis not present

## 2021-03-23 ENCOUNTER — Telehealth: Payer: Self-pay | Admitting: Family Medicine

## 2021-03-23 NOTE — Telephone Encounter (Signed)
Left message for patient to call back and schedule Medicare Annual Wellness Visit (AWV) either virtually or in office.    AWV-I PER [place] 04/15/20  please schedule at anytime with [facility] Nurse Health Advisor 1 or 2   This should be a 45 minute visit.

## 2021-04-29 ENCOUNTER — Telehealth: Payer: Self-pay | Admitting: Family Medicine

## 2021-04-29 NOTE — Telephone Encounter (Signed)
Left message for patient to call back and schedule Medicare Annual Wellness Visit (AWV) either virtually or in office.    AWV-I PER [place] 04/15/20 please schedule at anytime with [facility] Nurse Health Advisor 1 or 2   This should be a 45 minute visit.

## 2021-05-23 DIAGNOSIS — E785 Hyperlipidemia, unspecified: Secondary | ICD-10-CM | POA: Diagnosis not present

## 2021-05-23 DIAGNOSIS — I2699 Other pulmonary embolism without acute cor pulmonale: Secondary | ICD-10-CM | POA: Diagnosis not present

## 2021-05-23 DIAGNOSIS — I82402 Acute embolism and thrombosis of unspecified deep veins of left lower extremity: Secondary | ICD-10-CM | POA: Diagnosis not present

## 2021-05-23 DIAGNOSIS — I208 Other forms of angina pectoris: Secondary | ICD-10-CM | POA: Diagnosis not present

## 2021-05-23 DIAGNOSIS — I639 Cerebral infarction, unspecified: Secondary | ICD-10-CM | POA: Diagnosis not present

## 2021-05-23 DIAGNOSIS — I10 Essential (primary) hypertension: Secondary | ICD-10-CM | POA: Diagnosis not present

## 2021-05-26 DIAGNOSIS — Z7901 Long term (current) use of anticoagulants: Secondary | ICD-10-CM | POA: Diagnosis not present

## 2021-05-30 ENCOUNTER — Telehealth: Payer: Self-pay | Admitting: Family Medicine

## 2021-05-30 NOTE — Telephone Encounter (Signed)
Left message for patient to call back and schedule Medicare Annual Wellness Visit (AWV) either virtually or in office.  Left both  my jabber number [phone] and office number     AWV-I PER [place] 04/15/20  please schedule at anytime with [facility] Nurse Health Advisor 1 or 2   This should be a 45 minute visit.

## 2021-08-22 DIAGNOSIS — I82402 Acute embolism and thrombosis of unspecified deep veins of left lower extremity: Secondary | ICD-10-CM | POA: Diagnosis not present

## 2021-08-22 DIAGNOSIS — Z7901 Long term (current) use of anticoagulants: Secondary | ICD-10-CM | POA: Diagnosis not present

## 2021-08-22 DIAGNOSIS — I208 Other forms of angina pectoris: Secondary | ICD-10-CM | POA: Diagnosis not present

## 2021-08-22 DIAGNOSIS — I2699 Other pulmonary embolism without acute cor pulmonale: Secondary | ICD-10-CM | POA: Diagnosis not present

## 2021-10-16 DIAGNOSIS — E785 Hyperlipidemia, unspecified: Secondary | ICD-10-CM | POA: Diagnosis present

## 2021-10-25 DIAGNOSIS — Z7901 Long term (current) use of anticoagulants: Secondary | ICD-10-CM | POA: Diagnosis not present

## 2021-11-02 DIAGNOSIS — Z7901 Long term (current) use of anticoagulants: Secondary | ICD-10-CM | POA: Diagnosis not present

## 2021-11-10 DIAGNOSIS — Z7901 Long term (current) use of anticoagulants: Secondary | ICD-10-CM | POA: Diagnosis not present

## 2021-11-21 DIAGNOSIS — I2699 Other pulmonary embolism without acute cor pulmonale: Secondary | ICD-10-CM | POA: Diagnosis not present

## 2021-11-21 DIAGNOSIS — E785 Hyperlipidemia, unspecified: Secondary | ICD-10-CM | POA: Diagnosis not present

## 2021-11-21 DIAGNOSIS — I82402 Acute embolism and thrombosis of unspecified deep veins of left lower extremity: Secondary | ICD-10-CM | POA: Diagnosis not present

## 2021-11-21 DIAGNOSIS — I208 Other forms of angina pectoris: Secondary | ICD-10-CM | POA: Diagnosis not present

## 2021-11-21 DIAGNOSIS — I10 Essential (primary) hypertension: Secondary | ICD-10-CM | POA: Diagnosis not present

## 2021-12-05 DIAGNOSIS — Z7901 Long term (current) use of anticoagulants: Secondary | ICD-10-CM | POA: Diagnosis not present

## 2022-01-10 ENCOUNTER — Ambulatory Visit: Payer: Medicare Other | Admitting: Orthopedic Surgery

## 2022-01-17 ENCOUNTER — Ambulatory Visit (INDEPENDENT_AMBULATORY_CARE_PROVIDER_SITE_OTHER): Payer: Medicare Other | Admitting: Orthopedic Surgery

## 2022-01-17 ENCOUNTER — Ambulatory Visit (INDEPENDENT_AMBULATORY_CARE_PROVIDER_SITE_OTHER): Payer: Medicare Other

## 2022-01-17 DIAGNOSIS — M25561 Pain in right knee: Secondary | ICD-10-CM

## 2022-01-17 DIAGNOSIS — I872 Venous insufficiency (chronic) (peripheral): Secondary | ICD-10-CM | POA: Diagnosis not present

## 2022-01-17 DIAGNOSIS — G8929 Other chronic pain: Secondary | ICD-10-CM

## 2022-01-17 DIAGNOSIS — M1711 Unilateral primary osteoarthritis, right knee: Secondary | ICD-10-CM

## 2022-01-18 ENCOUNTER — Encounter: Payer: Self-pay | Admitting: Orthopedic Surgery

## 2022-01-18 DIAGNOSIS — M1711 Unilateral primary osteoarthritis, right knee: Secondary | ICD-10-CM

## 2022-01-18 MED ORDER — LIDOCAINE HCL (PF) 1 % IJ SOLN
5.0000 mL | INTRAMUSCULAR | Status: AC | PRN
  Administered 2022-01-18: 5 mL

## 2022-01-18 MED ORDER — METHYLPREDNISOLONE ACETATE 40 MG/ML IJ SUSP
40.0000 mg | INTRAMUSCULAR | Status: AC | PRN
  Administered 2022-01-18: 40 mg via INTRA_ARTICULAR

## 2022-01-18 NOTE — Progress Notes (Signed)
? ?Office Visit Note ?  ?Patient: Kristi Meyers           ?Date of Birth: 1956/04/21           ?MRN: [id number] ?Visit Date: 01/17/2022 ?             ?Requested by: Billie Ruddy, MD ?[address],  [place] [id number] ?PCP: Billie Ruddy, MD ? ?Chief Complaint  ?Patient presents with  ? Right Knee - Pain  ? Left Ankle - Pain  ? ? ? ? ?HPI: ?Patient is a 66 year old woman who is seen for initial evaluation for osteoarthritis right knee as well as left ankle pain and leg swelling.  Patient states she had a steroid injection in her right knee about 2-1/2 years ago.  Patient complains of swelling in the left leg and left ankle worse with her leg being dependent.  Patient states she previously has been scheduled for knee replacement but canceled due to lack of support at home.  Patient states she has had DVTs in her legs with pulmonary embolus years ago. ? ?Assessment & Plan: ?Visit Diagnoses:  ?1. Chronic pain of right knee   ?2. Primary osteoarthritis of right knee   ? ? ?Plan: Patient underwent the steroid injection for the right knee she tolerated this well recommended knee-high compression stockings for the venous insufficiency. ? ?Follow-Up Instructions: Return if symptoms worsen or fail to improve.  ? ?Ortho Exam ? ?Patient is alert, oriented, no adenopathy, well-dressed, normal affect, normal respiratory effort. ?Examination patient has an antalgic gait she has varus alignment to the right knee.  She has tenderness to palpation of the medial lateral joint line as well as the patellofemoral joint collaterals and cruciates are stable there is no effusion no cellulitis.  Patient has pitting edema worse in the left lower extremity with venous insufficiency.  No venous ulcers. ? ?Imaging: ?XR Knee 1-2 Views Right ? ?Result Date: 01/18/2022 ?2 view radiographs of the right knee shows tricompartmental arthritic changes with periarticular bony spurs in all 3 compartments subcondylar sclerosis and cysts  as well as calcification of the popliteal artery  ?No images are attached to the encounter. ? ?Labs: ?Lab Results  ?Component Value Date  ? REPTSTATUS 06/20/2020 FINAL 06/18/2020  ? CULT >=100,000 COLONIES/mL ESCHERICHIA COLI (A) 06/18/2020  ? LABORGA ESCHERICHIA COLI (A) 06/18/2020  ? ? ? ?Lab Results  ?Component Value Date  ? ALBUMIN 3.6 09/03/2020  ? ALBUMIN 3.0 (L) 06/17/2020  ? ALBUMIN 2.3 (L) 12/14/2017  ? ? ?Lab Results  ?Component Value Date  ? MG 2.1 12/12/2017  ? MG 1.4 (L) 12/11/2017  ? MG 2.0 12/09/2017  ? ?No results found for: VD25OH ? ?No results found for: PREALBUMIN ? ?  Latest Ref Rng & Units 09/03/2020  ?  1:31 PM 06/24/2020  ?  3:53 PM 06/17/2020  ?  9:11 PM  ?CBC EXTENDED  ?WBC 4.0 - 10.5 K/uL 8.1   9.2   12.8    ?RBC 3.87 - 5.11 MIL/uL 4.03   3.06   3.57    ?Hemoglobin 12.0 - 15.0 g/dL 10.5   8.0   9.3    ?HCT 36.0 - 46.0 % 34.7   25.7   30.8    ?Platelets 150 - 400 K/uL 289   368   282    ?NEUT# 1,500 - 7,800 cells/uL  5,575   9.4    ?Lymph# 850 - 3,900 cells/uL  2,392   2.3    ? ? ? ?  There is no height or weight on file to calculate BMI. ? ?Orders:  ?Orders Placed This Encounter  ?Procedures  ? XR Knee 1-2 Views Right  ? ?No orders of the defined types were placed in this encounter. ? ? ? Procedures: ?Large Joint Inj: R knee on 01/18/2022 4:19 PM ?Indications: pain and diagnostic evaluation ?Details: 22 G 1.5 in needle, anteromedial approach ? ?Arthrogram: No ? ?Medications: 5 mL lidocaine (PF) 1 %; 40 mg methylPREDNISolone acetate 40 MG/ML ?Outcome: tolerated well, no immediate complications ?Procedure, treatment alternatives, risks and benefits explained, specific risks discussed. Consent was given by the patient. Immediately prior to procedure a time out was called to verify the correct patient, procedure, equipment, support staff and site/side marked as required. Patient was prepped and draped in the usual sterile fashion.  ? ? ? ?Clinical Data: ?No additional findings. ? ?ROS: ? ?All other  systems negative, except as noted in the HPI. ?Review of Systems ? ?Objective: ?Vital Signs: LMP 11/17/2008 (Approximate)  ? ?Specialty Comments:  ?No specialty comments available. ? ?PMFS History: ?Patient Active Problem List  ? Diagnosis Date Noted  ? 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 Postmenopausal bleeding 07/01/2020  ? Fibroid 07/01/2020  ? History of pulmonary embolus ([place]) 07/01/2020  ? Primary localized osteoarthritis of right knee 03/14/2019  ? Abdominal aortic aneurysm (AAA) 3.0 cm to 5.0 cm in diameter in female [facility]) 02/18/2018  ? Shock ([place]) 12/15/2017  ? Elevated troponin 12/15/2017  ? AKI (acute kidney injury) ([place]) 12/15/2017  ? E. coli UTI 12/15/2017  ? Respiratory distress   ? Acute saddle pulmonary embolus ([place]) 12/10/2017  ? ?Past Medical History:  ?Diagnosis Date  ? Abnormal EKG   ? hx of left bundle branch block   ? Anginal pain ([place])   ? stable  ? GERD (gastroesophageal reflux disease)   ? Heart murmur   ? soft systolic per dr Terrence Dupont [id number] lov  ? High cholesterol   ? Hypertension   ? Left leg DVT ([place]) 11/2017  ? pulmonary embolus  ? Migraine   ? PMB (postmenopausal bleeding)   ? Vertigo   ?  ?History reviewed. No pertinent family history.  ?Past Surgical History:  ?Procedure Laterality Date  ? CHOLECYSTECTOMY  2011  ? DILATATION & CURETTAGE/HYSTEROSCOPY WITH MYOSURE N/A 09/07/2020  ? Procedure: DILATATION & CURETTAGE/HYSTEROSCOPY WITH MYOSURE;  Surgeon: Salvadore Dom, MD;  Location: [facility];  Service: Gynecology;  Laterality: N/A;  ? IR ANGIOGRAM PULMONARY BILATERAL SELECTIVE  12/10/2017  ? IR ANGIOGRAM SELECTIVE EACH ADDITIONAL VESSEL  12/10/2017  ? IR ANGIOGRAM SELECTIVE EACH ADDITIONAL VESSEL  12/10/2017  ? IR INFUSION THROMBOL ARTERIAL INITIAL (MS)  12/10/2017  ? IR INFUSION THROMBOL ARTERIAL INITIAL (MS)  12/10/2017  ? IR THROMB F/U EVAL ART/VEN FINAL DAY ([place])  12/11/2017  ? IR US GUIDE VASC ACCESS RIGHT  12/10/2017  ? ?Social History   ? ?Occupational History  ? Not on file  ?Tobacco Use  ? Smoking status: Former  ?  Packs/day: 0.25  ?  Years: 20.00  ?  Pack years: 5.00  ?  Types: Cigarettes  ?  Quit date: 10/16/2017  ?  Years since quitting: 4.2  ? Smokeless tobacco: Never  ?Vaping Use  ? Vaping Use: Never used  ?Substance and Sexual Activity  ? Alcohol use: No  ? Drug use: Yes  ?  Types: Marijuana  ?  Comment: occ marijuana last used sept 2021  ? Sexual activity:  Not on file  ? ? ? ? ? ?

## 2022-02-17 DIAGNOSIS — I2699 Other pulmonary embolism without acute cor pulmonale: Secondary | ICD-10-CM | POA: Diagnosis not present

## 2022-02-17 DIAGNOSIS — Z7901 Long term (current) use of anticoagulants: Secondary | ICD-10-CM | POA: Diagnosis not present

## 2022-02-17 DIAGNOSIS — I208 Other forms of angina pectoris: Secondary | ICD-10-CM | POA: Diagnosis not present

## 2022-02-17 DIAGNOSIS — E785 Hyperlipidemia, unspecified: Secondary | ICD-10-CM | POA: Diagnosis not present

## 2022-02-17 DIAGNOSIS — I82402 Acute embolism and thrombosis of unspecified deep veins of left lower extremity: Secondary | ICD-10-CM | POA: Diagnosis not present

## 2022-02-17 DIAGNOSIS — I10 Essential (primary) hypertension: Secondary | ICD-10-CM | POA: Diagnosis not present

## 2022-05-29 DIAGNOSIS — I10 Essential (primary) hypertension: Secondary | ICD-10-CM | POA: Diagnosis present

## 2022-05-31 DIAGNOSIS — Z7901 Long term (current) use of anticoagulants: Secondary | ICD-10-CM

## 2022-07-10 ENCOUNTER — Inpatient Hospital Stay (HOSPITAL_COMMUNITY)
Admission: EM | Admit: 2022-07-10 | Discharge: 2022-07-18 | DRG: 813 | Disposition: A | Discharge status: Home Health | Payer: Medicare HMO | Attending: Internal Medicine | Admitting: Internal Medicine

## 2022-07-10 ENCOUNTER — Emergency Department (HOSPITAL_COMMUNITY): Payer: Medicare HMO

## 2022-07-10 ENCOUNTER — Encounter (HOSPITAL_COMMUNITY): Payer: Self-pay

## 2022-07-10 ENCOUNTER — Other Ambulatory Visit: Payer: Self-pay

## 2022-07-10 DIAGNOSIS — K219 Gastro-esophageal reflux disease without esophagitis: Secondary | ICD-10-CM | POA: Diagnosis present

## 2022-07-10 DIAGNOSIS — Z8249 Family history of ischemic heart disease and other diseases of the circulatory system: Secondary | ICD-10-CM

## 2022-07-10 DIAGNOSIS — Z95828 Presence of other vascular implants and grafts: Secondary | ICD-10-CM

## 2022-07-10 DIAGNOSIS — E7800 Pure hypercholesterolemia, unspecified: Secondary | ICD-10-CM | POA: Diagnosis present

## 2022-07-10 DIAGNOSIS — R262 Difficulty in walking, not elsewhere classified: Secondary | ICD-10-CM | POA: Diagnosis present

## 2022-07-10 DIAGNOSIS — B9620 Unspecified Escherichia coli [E. coli] as the cause of diseases classified elsewhere: Secondary | ICD-10-CM | POA: Diagnosis present

## 2022-07-10 DIAGNOSIS — Z87891 Personal history of nicotine dependence: Secondary | ICD-10-CM

## 2022-07-10 DIAGNOSIS — D6832 Hemorrhagic disorder due to extrinsic circulating anticoagulants: Secondary | ICD-10-CM | POA: Diagnosis not present

## 2022-07-10 DIAGNOSIS — Z79899 Other long term (current) drug therapy: Secondary | ICD-10-CM

## 2022-07-10 DIAGNOSIS — E669 Obesity, unspecified: Secondary | ICD-10-CM | POA: Diagnosis present

## 2022-07-10 DIAGNOSIS — R310 Gross hematuria: Secondary | ICD-10-CM | POA: Diagnosis present

## 2022-07-10 DIAGNOSIS — N939 Abnormal uterine and vaginal bleeding, unspecified: Secondary | ICD-10-CM

## 2022-07-10 DIAGNOSIS — E8809 Other disorders of plasma-protein metabolism, not elsewhere classified: Secondary | ICD-10-CM | POA: Diagnosis present

## 2022-07-10 DIAGNOSIS — Z86718 Personal history of other venous thrombosis and embolism: Secondary | ICD-10-CM

## 2022-07-10 DIAGNOSIS — I447 Left bundle-branch block, unspecified: Secondary | ICD-10-CM | POA: Diagnosis present

## 2022-07-10 DIAGNOSIS — K5900 Constipation, unspecified: Secondary | ICD-10-CM | POA: Diagnosis not present

## 2022-07-10 DIAGNOSIS — R933 Abnormal findings on diagnostic imaging of other parts of digestive tract: Secondary | ICD-10-CM

## 2022-07-10 DIAGNOSIS — R1030 Lower abdominal pain, unspecified: Secondary | ICD-10-CM | POA: Diagnosis not present

## 2022-07-10 DIAGNOSIS — N390 Urinary tract infection, site not specified: Secondary | ICD-10-CM | POA: Diagnosis present

## 2022-07-10 DIAGNOSIS — Z6834 Body mass index (BMI) 34.0-34.9, adult: Secondary | ICD-10-CM

## 2022-07-10 DIAGNOSIS — I10 Essential (primary) hypertension: Secondary | ICD-10-CM | POA: Diagnosis present

## 2022-07-10 DIAGNOSIS — D62 Acute posthemorrhagic anemia: Secondary | ICD-10-CM

## 2022-07-10 DIAGNOSIS — Z86711 Personal history of pulmonary embolism: Secondary | ICD-10-CM

## 2022-07-10 DIAGNOSIS — R1013 Epigastric pain: Secondary | ICD-10-CM

## 2022-07-10 DIAGNOSIS — K3189 Other diseases of stomach and duodenum: Secondary | ICD-10-CM

## 2022-07-10 DIAGNOSIS — Z6835 Body mass index (BMI) 35.0-35.9, adult: Secondary | ICD-10-CM

## 2022-07-10 DIAGNOSIS — D259 Leiomyoma of uterus, unspecified: Secondary | ICD-10-CM | POA: Diagnosis present

## 2022-07-10 DIAGNOSIS — E876 Hypokalemia: Secondary | ICD-10-CM | POA: Diagnosis present

## 2022-07-10 DIAGNOSIS — Z9049 Acquired absence of other specified parts of digestive tract: Secondary | ICD-10-CM

## 2022-07-10 DIAGNOSIS — T45515A Adverse effect of anticoagulants, initial encounter: Secondary | ICD-10-CM | POA: Diagnosis present

## 2022-07-10 DIAGNOSIS — N179 Acute kidney failure, unspecified: Secondary | ICD-10-CM | POA: Diagnosis not present

## 2022-07-10 DIAGNOSIS — Z7901 Long term (current) use of anticoagulants: Secondary | ICD-10-CM

## 2022-07-10 LAB — COMPREHENSIVE METABOLIC PANEL
ALT: 14 U/L (ref 0–44)
AST: 14 U/L — ABNORMAL LOW (ref 15–41)
Albumin: 3.5 g/dL (ref 3.5–5.0)
Alkaline Phosphatase: 67 U/L (ref 38–126)
Anion gap: 7 (ref 5–15)
BUN: 19 mg/dL (ref 8–23)
CO2: 25 mmol/L (ref 22–32)
Calcium: 9.1 mg/dL (ref 8.9–10.3)
Chloride: 110 mmol/L (ref 98–111)
Creatinine, Ser: 1.1 mg/dL — ABNORMAL HIGH (ref 0.44–1.00)
GFR, Estimated: 55 mL/min — ABNORMAL LOW (ref >60)
Glucose, Bld: 99 mg/dL (ref 70–99)
Potassium: 4.2 mmol/L (ref 3.5–5.1)
Sodium: 142 mmol/L (ref 135–145)
Total Bilirubin: 0.8 mg/dL (ref 0.3–1.2)
Total Protein: 7.4 g/dL (ref 6.5–8.1)

## 2022-07-10 LAB — CBC WITH DIFFERENTIAL/PLATELET
Abs Immature Granulocytes: 0.02 10*3/uL (ref 0.00–0.07)
Basophils Absolute: 0.1 10*3/uL (ref 0.0–0.1)
Basophils Relative: 1 %
Eosinophils Absolute: 0.1 10*3/uL (ref 0.0–0.5)
Eosinophils Relative: 2 %
HCT: 36.4 % (ref 36.0–46.0)
Hemoglobin: 11.2 g/dL — ABNORMAL LOW (ref 12.0–15.0)
Immature Granulocytes: 0 %
Lymphocytes Relative: 35 %
Lymphs Abs: 3.1 10*3/uL (ref 0.7–4.0)
MCH: 26.8 pg (ref 26.0–34.0)
MCHC: 30.8 g/dL (ref 30.0–36.0)
MCV: 87.1 fL (ref 80.0–100.0)
Monocytes Absolute: 0.8 10*3/uL (ref 0.1–1.0)
Monocytes Relative: 9 %
Neutro Abs: 4.7 10*3/uL (ref 1.7–7.7)
Neutrophils Relative %: 53 %
Platelets: 329 10*3/uL (ref 150–400)
RBC: 4.18 MIL/uL (ref 3.87–5.11)
RDW: 16.1 % — ABNORMAL HIGH (ref 11.5–15.5)
WBC: 8.9 10*3/uL (ref 4.0–10.5)
nRBC: 0 % (ref 0.0–0.2)

## 2022-07-10 LAB — BRAIN NATRIURETIC PEPTIDE: B Natriuretic Peptide: 79.2 pg/mL (ref 0.0–100.0)

## 2022-07-10 LAB — LIPASE, BLOOD: Lipase: 26 U/L (ref 11–51)

## 2022-07-10 LAB — TROPONIN I (HIGH SENSITIVITY): Troponin I (High Sensitivity): 17 ng/L (ref <18)

## 2022-07-10 MED ORDER — HYDROCODONE-ACETAMINOPHEN 5-325 MG PO TABS
1.0000 | ORAL_TABLET | Freq: Once | ORAL | Status: AC
  Administered 2022-07-10: 1 via ORAL
  Filled 2022-07-10: qty 1

## 2022-07-10 MED ORDER — CARVEDILOL 12.5 MG PO TABS
25.0000 mg | ORAL_TABLET | Freq: Two times a day (BID) | ORAL | Status: DC
  Administered 2022-07-11: 25 mg via ORAL
  Filled 2022-07-10: qty 2

## 2022-07-10 MED ORDER — FENTANYL CITRATE PF 50 MCG/ML IJ SOSY
50.0000 ug | PREFILLED_SYRINGE | Freq: Once | INTRAMUSCULAR | Status: AC
  Administered 2022-07-10: 50 ug via INTRAVENOUS
  Filled 2022-07-10: qty 1

## 2022-07-10 MED ORDER — FENTANYL CITRATE PF 50 MCG/ML IJ SOSY: 50.0000 ug | PREFILLED_SYRINGE | Freq: Once | INTRAMUSCULAR | Status: DC

## 2022-07-10 NOTE — ED Provider Triage Note (Signed)
Emergency Medicine Provider Triage Evaluation Note  Kristi Meyers , a 66 y.o. female  was evaluated in triage.  Pt complains of lower abdominal pain, and left flank pain since Friday.  She says she started noticing blood in her urine.  Pain has been constant.  She reports this happening 2 years ago and says that she was diagnosed with a polyp.  She specifically denies this being vaginal bleeding or bleeding in her stools.  She denies any fevers or chills.  Review of Systems  Positive:  Negative:   Physical Exam  BP 111/83 (BP Location: Left Arm)   Pulse (!) 43   Temp 98.7 F (37.1 C) (Oral)   Resp 18   Ht '5\' 4"'$  (1.626 m)   Wt 85.7 kg   LMP 11/17/2008 (Approximate)   SpO2 96%   BMI 32.44 kg/m  Gen:   Awake, no distress   Resp:  Normal effort  MSK:   Moves extremities without difficulty  Other:  + suprapubic, LLQ, Left CVA tenderness  Medical Decision Making  Medically screening exam initiated at 12:25 PM.  Appropriate orders placed.  Kristi Meyers was informed that the remainder of the evaluation will be completed by another provider, this initial triage assessment does not replace that evaluation, and the importance of remaining in the ED until their evaluation is complete.     Adolphus Birchwood, PA-C 07/10/22 1226

## 2022-07-10 NOTE — ED Provider Notes (Signed)
[facility] DEPT Provider Note   CSN: [id number] Arrival date & time: 07/10/22  1127     History {Add pertinent medical, surgical, social history, OB history to HPI:1} Chief Complaint  Patient presents with   Abdominal Pain   Hematuria    Kristi Meyers is a 66 y.o. female.   Abdominal Pain Associated symptoms: hematuria   Hematuria Associated symptoms include abdominal pain.       Home Medications Prior to Admission medications   Medication Sig Start Date End Date Taking? Authorizing Provider  acetaminophen (TYLENOL) 500 MG tablet Take 500 mg by mouth every 6 (six) hours as needed.    [provider]  AMLODIPINE BESYLATE PO Take 5 mg by mouth every evening.     [provider]  atorvastatin (LIPITOR) 40 MG tablet Take 40 mg by mouth at bedtime.     [provider]  carvedilol (COREG) 25 MG tablet Take 25 mg by mouth 2 (two) times daily with a meal.    [provider]  losartan (COZAAR) 50 MG tablet Take 50 mg by mouth daily.    [provider]  meclizine (ANTIVERT) 12.5 MG tablet Take 12.5 mg by mouth 2 (two) times daily.    [provider]  POTASSIUM CHLORIDE ER PO Take 20 mg by mouth daily.    [provider]  traMADol (ULTRAM) 50 MG tablet Take 1 tablet (50 mg total) by mouth every 8 (eight) hours as needed for severe pain. Patient not taking: Reported on 09/20/2020 10/29/17   Jaynee Eagles, PA-C  warfarin (COUMADIN) 5 MG tablet Take 5 mg by mouth daily. 05/12/20   [provider]      Allergies    Patient has no known allergies.    Review of Systems   Review of Systems  Gastrointestinal:  Positive for abdominal pain.  Genitourinary:  Positive for hematuria.    Physical Exam Updated Vital Signs BP (!) 145/90 (BP Location: Left Arm)   Pulse 78   Temp 98.6 F (37 C) (Oral)   Resp 17   Ht '5\' 4"'$  (1.626 m)   Wt 85.7 kg   LMP 11/17/2008 (Approximate)   SpO2  100%   BMI 32.44 kg/m  Physical Exam  ED Results / Procedures / Treatments   Labs (all labs ordered are listed, but only abnormal results are displayed) Labs Reviewed  CBC WITH DIFFERENTIAL/PLATELET - Abnormal; Notable for the following components:      Result Value   Hemoglobin 11.2 (*)    RDW 16.1 (*)    All other components within normal limits  COMPREHENSIVE METABOLIC PANEL - Abnormal; Notable for the following components:   Creatinine, Ser 1.10 (*)    AST 14 (*)    GFR, Estimated 55 (*)    All other components within normal limits  LIPASE, BLOOD  URINALYSIS, ROUTINE W REFLEX MICROSCOPIC    EKG None  Radiology CT Renal Stone Study  Result Date: 07/10/2022 CLINICAL DATA:  Lower abdominal pain with hematuria x3 days. EXAM: CT ABDOMEN AND PELVIS WITHOUT CONTRAST TECHNIQUE: Multidetector CT imaging of the abdomen and pelvis was performed following the standard protocol without IV contrast. RADIATION DOSE REDUCTION: This exam was performed according to the departmental dose-optimization program which includes automated exposure control, adjustment of the mA and/or kV according to patient size and/or use of iterative reconstruction technique. COMPARISON:  CT December 10, 2017. FINDINGS: Lower chest: No acute abnormality. Hepatobiliary: Unremarkable noncontrast enhanced appearance of the  hepatic parenchyma. Gallbladder surgically absent. No biliary ductal dilation. Pancreas: No pancreatic ductal dilation or evidence of acute inflammation. Spleen: No splenomegaly. Adrenals/Urinary Tract: Bilateral adrenal glands are within normal limits. No hydronephrosis. Nonobstructive punctate left upper/interpolar renal stone. Hypodense 2.1 cm left renal lesion measures fluid density consistent with a cyst and considered benign requiring no independent imaging follow-up. Subcentimeter hypodense right renal lesion measures 9 mm on image 30/2, technically too small to accurately characterize but  statistically likely to reflect a cyst. No renal, ureteral bladder calculi. Gas in the urinary bladder recommend correlation with history of recent instrumentation. Stomach/Bowel: No radiopaque enteric contrast material was administered. Masslike lobular area along the greater curvature of the gastric fundus best seen on coronal image 80/4 measuring 2.9 x 3.9 x 3.7 cm on images 18/2 and 80/4. No pathologic dilation of small or large bowel. Appendix and terminal ileum appear normal. Colonic diverticulosis without findings of acute diverticulitis. Vascular/Lymphatic: Aneurysmal dilation of the infrarenal abdominal aorta measures 3.4 cm on image 47/2 previously measuring 3.1 cm on CT December 10, 2017. 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 No pathologically enlarged abdominal or pelvic lymph nodes. Reproductive: Uterus and bilateral adnexa are unremarkable. Other: No significant abdominopelvic free fluid. Musculoskeletal: No acute osseous abnormality. Multilevel degenerative changes spine. IMPRESSION: 1. Nonobstructive punctate left upper/interpolar renal stone. No obstructive ureteral or bladder calculi. 2. Masslike 3.9 cm lobular exophytic area along the greater curvature of the gastric fundus is nonspecific possibly reflecting a focal outpouching and fold thickening but is suspicious for a discrete gastric mass such as a gastrointestinal stromal tumor. Suggest gastroenterology consult and further evaluation with nonemergent CT with oral and IV contrast versus upper endoscopy. 3. Colonic diverticulosis without findings of acute diverticulitis. 4. Increased size of the infrarenal abdominal aortic aneurysm now measuring 3.4 cm Recommend follow-up ultrasound every 3 years. This recommendation follows ACR consensus guidelines: White Paper of the ACR Incidental Findings Committee II on Vascular Findings. J Am Coll Radiol 2013; 10:789-794. 5. Similar  aneurysmal dilation of the right common iliac artery measuring 2.1 cm. 6.  Aortic Atherosclerosis (ICD10-I70.0). Electronically Signed   By: Dahlia Bailiff M.D.   On: 07/10/2022 14:14    Procedures Procedures  {Document cardiac monitor, telemetry assessment procedure when appropriate:1}  Medications Ordered in ED Medications - No data to display  ED Course/ Medical Decision Making/ A&P                           Medical Decision Making  ***  {Document critical care time when appropriate:1} {Document review of labs and clinical decision tools ie heart score, Chads2Vasc2 etc:1}  {Document your independent review of radiology images, and any outside records:1} {Document your discussion with family members, caretakers, and with consultants:1} {Document social determinants of health affecting pt's care:1} {Document your decision making why or why not admission, treatments were needed:1} Final Clinical Impression(s) / ED Diagnoses Final diagnoses:  None    Rx / DC Orders ED Discharge Orders     None

## 2022-07-10 NOTE — ED Triage Notes (Signed)
Patient c/o lower abdominal pain and states she has been seeing blood in her urine x 3 days. Patient denies dysuria or urinary frequency.

## 2022-07-11 DIAGNOSIS — R310 Gross hematuria: Secondary | ICD-10-CM | POA: Diagnosis present

## 2022-07-11 LAB — PROTIME-INR
INR: 5.1 (ref 0.8–1.2)
INR: 5.2 (ref 0.8–1.2)
Prothrombin Time: 47 seconds — ABNORMAL HIGH (ref 11.4–15.2)
Prothrombin Time: 47.3 seconds — ABNORMAL HIGH (ref 11.4–15.2)

## 2022-07-11 LAB — URINALYSIS, MICROSCOPIC (REFLEX)
Non Squamous Epithelial: NONE SEEN
RBC / HPF: >50 RBC/hpf (ref 0–5)
Squamous Epithelial / HPF: NONE SEEN (ref 0–5)

## 2022-07-11 LAB — CBC WITH DIFFERENTIAL/PLATELET
Abs Immature Granulocytes: 0.01 10*3/uL (ref 0.00–0.07)
Basophils Absolute: 0.1 10*3/uL (ref 0.0–0.1)
Basophils Relative: 1 %
Eosinophils Absolute: 0.1 10*3/uL (ref 0.0–0.5)
Eosinophils Relative: 2 %
HCT: 31.2 % — ABNORMAL LOW (ref 36.0–46.0)
Hemoglobin: 9.9 g/dL — ABNORMAL LOW (ref 12.0–15.0)
Immature Granulocytes: 0 %
Lymphocytes Relative: 33 %
Lymphs Abs: 2.8 10*3/uL (ref 0.7–4.0)
MCH: 27.3 pg (ref 26.0–34.0)
MCHC: 31.7 g/dL (ref 30.0–36.0)
MCV: 86 fL (ref 80.0–100.0)
Monocytes Absolute: 0.8 10*3/uL (ref 0.1–1.0)
Monocytes Relative: 9 %
Neutro Abs: 4.8 10*3/uL (ref 1.7–7.7)
Neutrophils Relative %: 55 %
Platelets: 251 10*3/uL (ref 150–400)
RBC: 3.63 MIL/uL — ABNORMAL LOW (ref 3.87–5.11)
RDW: 16.2 % — ABNORMAL HIGH (ref 11.5–15.5)
WBC: 8.5 10*3/uL (ref 4.0–10.5)
nRBC: 0 % (ref 0.0–0.2)

## 2022-07-11 LAB — HEMOGLOBIN AND HEMATOCRIT, BLOOD
HCT: 31.9 % — ABNORMAL LOW (ref 36.0–46.0)
HCT: 31.6 % — ABNORMAL LOW (ref 36.0–46.0)
HCT: 32.5 % — ABNORMAL LOW (ref 36.0–46.0)
HCT: 32.2 % — ABNORMAL LOW (ref 36.0–46.0)
Hemoglobin: 9.9 g/dL — ABNORMAL LOW (ref 12.0–15.0)
Hemoglobin: 9.9 g/dL — ABNORMAL LOW (ref 12.0–15.0)
Hemoglobin: 10.2 g/dL — ABNORMAL LOW (ref 12.0–15.0)
Hemoglobin: 10.1 g/dL — ABNORMAL LOW (ref 12.0–15.0)

## 2022-07-11 LAB — COMPREHENSIVE METABOLIC PANEL
ALT: 15 U/L (ref 0–44)
AST: 15 U/L (ref 15–41)
Albumin: 3.2 g/dL — ABNORMAL LOW (ref 3.5–5.0)
Alkaline Phosphatase: 65 U/L (ref 38–126)
Anion gap: 8 (ref 5–15)
BUN: 21 mg/dL (ref 8–23)
CO2: 23 mmol/L (ref 22–32)
Calcium: 8.8 mg/dL — ABNORMAL LOW (ref 8.9–10.3)
Chloride: 112 mmol/L — ABNORMAL HIGH (ref 98–111)
Creatinine, Ser: 0.95 mg/dL (ref 0.44–1.00)
GFR, Estimated: >60 mL/min (ref >60)
Glucose, Bld: 86 mg/dL (ref 70–99)
Potassium: 4.1 mmol/L (ref 3.5–5.1)
Sodium: 143 mmol/L (ref 135–145)
Total Bilirubin: 0.9 mg/dL (ref 0.3–1.2)
Total Protein: 6.7 g/dL (ref 6.5–8.1)

## 2022-07-11 LAB — URINALYSIS, ROUTINE W REFLEX MICROSCOPIC

## 2022-07-11 LAB — TYPE AND SCREEN
ABO/RH(D): B POS
Antibody Screen: NEGATIVE

## 2022-07-11 LAB — TROPONIN I (HIGH SENSITIVITY): Troponin I (High Sensitivity): 24 ng/L — ABNORMAL HIGH (ref <18)

## 2022-07-11 LAB — MAGNESIUM: Magnesium: 1.8 mg/dL (ref 1.7–2.4)

## 2022-07-11 MED ORDER — LACTATED RINGERS IV SOLN: INTRAVENOUS | Status: AC

## 2022-07-11 MED ORDER — ACETAMINOPHEN 325 MG PO TABS
650.0000 mg | ORAL_TABLET | Freq: Four times a day (QID) | ORAL | Status: DC | PRN
  Administered 2022-07-11 – 2022-07-12 (×4): 650 mg via ORAL
  Filled 2022-07-11 (×4): qty 2

## 2022-07-11 MED ORDER — LACTATED RINGERS IV BOLUS
1000.0000 mL | Freq: Once | INTRAVENOUS | Status: AC
  Administered 2022-07-11: 1000 mL via INTRAVENOUS

## 2022-07-11 MED ORDER — ACETAMINOPHEN 650 MG RE SUPP: 650.0000 mg | Freq: Four times a day (QID) | RECTAL | Status: DC | PRN

## 2022-07-11 MED ORDER — SODIUM CHLORIDE 0.9 % IV SOLN
1.0000 g | INTRAVENOUS | Status: DC
  Administered 2022-07-11 – 2022-07-13 (×2): 1 g via INTRAVENOUS
  Filled 2022-07-11 (×2): qty 10

## 2022-07-11 NOTE — Progress Notes (Signed)
  Carryover admission to the Day Admitter.  I discussed this case with the EDP, Dr. Betsey Holiday.  Per these discussions:   This is a 66 year old female with history of bleeding uterine polyp in 2021, pulmonary embolism currently anticoagulated on warfarin, who is being admitted with gross hematuria after presenting with complaints of 3 to 4 days of abdominal pain associated with gross hematuria.  Transvaginal ultrasound showed uterine fibroids alone.  CT renal stone was suggestive of gastric mass, prompting prior shift's EDP to d/w on-call gastroenterology, Dr. Watt Climes, Who recommended that the patient follow-up with him as an outpatient, for evaluation for EGD.  Additionally, prior shifts EDP also attempted to contact on-call OB/GYN, but did not receive return call/response from their office.  Ensuing urinalysis via in and out cath revealed gross blood, with ongoing gross hematuria via pure wick. No reported cp or sob.   In the setting of being anticoagulated on warfarin for pulmonary embolism, INR found to be slightly supratherapeutic at 5.1.  Presenting hemoglobin 11.2, consistent with her baseline range of 9-11, with most recent prior hemoglobin noted to be 10.5 in November 2021.  She appears hemodynamically stable, without evidence of tachycardia, and with systolic blood pressures in the 120s to 130s throughout her ED course.   Given that she is reportedly on warfarin for treatment for pulmonary embolism, will refrain from aggressive reversal of her INR.  Rather, holding home warfarin for now, with repeat INR ordered with morning labs.  Additionally, I requested from EDP that on-call urology be contacted, with request for consultation.   I subsequently excepted the patient for admission, have placed an order for observation to PCU.   I have placed some additional preliminary admit orders via the adult multi-morbid admission order set. I have also ordered morning labs to include CMP, CBC, serum  magnesium level, INR.  Ordered a repeat H&H for 9 AM this morning, n.p.o., and continuous LR at 75 cc/h x 8 hours.    Babs Bertin, DO Hospitalist

## 2022-07-11 NOTE — Consult Note (Signed)
Urology Consult   Physician requesting consult: Dr. Velia Meyer  Reason for consult: Gross hematuria  History of Present Illness: Kristi Meyers is a 66 y.o. female with a history of uterine bleeding found to have uterine polyp in 2021, pulmonary embolism on warfarin and hypertension who presented to the emergency department with 3 days of gross hematuria.  In the ED, the patient is afebrile and hemodynamically stable.  Labs notable for no leukocytosis, hemoglobin 11.2, creatinine 1.1.  Urinalysis with many bacteria.  Urine culture in process.  INR supratherapeutic at 5.1.  Subsequent CT renal stone protocol with punctate nonobstructing stone in left kidney, left renal cyst and very small right renal mass too small to characterize without any other obvious abnormalities of her kidneys, ureter, bladder or signs of obstruction.  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  Per ED, pelvic exam was performed to rule out vaginal bleeding and there was some blood noted by her cervix.  Once cleaned up, they I/O catheterized her which was significant for gross hematuria.  Of note, repeat labs notable for hemoglobin downtrending to 9.9 today.  CHEM 10 and INR pending.  The patient does note lower abdominal pain but denies any chest pain, shortness of breath or dizziness.  Denies any flank pain at this time.  Denies any dysuria or urinary frequency.  The patient denies any personal or family history of urologic malignancy.  She has never seen a urologist before. She does report a smoking history of more than 20 years.  She stopped smoking a few years ago in the setting of vaginal bleeding.   Past Medical History:  Diagnosis Date   Abnormal EKG    hx of left bundle branch block    Anginal pain (HCC)    stable   GERD (gastroesophageal reflux disease)    Heart murmur    soft systolic per dr Terrence Dupont [id number] [facility]   High cholesterol    Hypertension    Left leg DVT ([place]) 11/2017   pulmonary embolus    Migraine    PMB (postmenopausal bleeding)    Vertigo     Past Surgical History:  Procedure Laterality Date   CHOLECYSTECTOMY  2011   [place] N/A 09/07/2020   Procedure: DILATATION & CURETTAGE/HYSTEROSCOPY WITH MYOSURE;  Surgeon: Salvadore Dom, MD;  Location: [facility];  Service: Gynecology;  Laterality: N/A;   IR ANGIOGRAM PULMONARY BILATERAL SELECTIVE  12/10/2017   IR ANGIOGRAM SELECTIVE EACH ADDITIONAL VESSEL  12/10/2017   IR ANGIOGRAM SELECTIVE EACH ADDITIONAL VESSEL  12/10/2017   IR INFUSION THROMBOL ARTERIAL INITIAL (MS)  12/10/2017   IR INFUSION THROMBOL ARTERIAL INITIAL (MS)  12/10/2017   IR THROMB F/U EVAL ART/VEN FINAL DAY (MS)  12/11/2017   IR US GUIDE VASC ACCESS RIGHT  12/10/2017    Current Hospital Medications:  Home Meds:  Current Meds  Medication Sig   acetaminophen (TYLENOL) 500 MG tablet Take 500 mg by mouth every 6 (six) hours as needed.   amLODipine (NORVASC) 5 MG tablet Take 5 mg by mouth daily.   atorvastatin (LIPITOR) 40 MG tablet Take 40 mg by mouth at bedtime.    carvedilol (COREG) 25 MG tablet Take 25 mg by mouth 2 (two) times daily with a meal.   losartan (COZAAR) 100 MG tablet Take 100 mg by mouth daily.   meclizine (ANTIVERT) 12.5 MG tablet Take 12.5 mg by mouth 2 (two) times daily.   nitroGLYCERIN (NITROSTAT) 0.4 MG  SL tablet Place under the tongue.   warfarin (COUMADIN) 4 MG tablet Take 4 mg by mouth. 4 mg daily and 2 mg on sunday    Scheduled Meds: Continuous Infusions:  lactated ringers 75 mL/hr at 07/11/22 0452   PRN Meds:.acetaminophen **OR** acetaminophen  Allergies: No Known Allergies  History reviewed. No pertinent family history.  Social History:  reports that she quit smoking about 4 years ago. Her smoking use included cigarettes. She has a 5.00 pack-year smoking history. She has never used smokeless tobacco. She reports current drug use. Drug: Marijuana. She reports that she  does not drink alcohol.  ROS: A complete review of systems was performed.  All systems are negative except for pertinent findings as noted.  Physical Exam:  Vital signs in last 24 hours: Temp:  [98.2 F (36.8 C)-98.7 F (37.1 C)] 98.2 F (36.8 C) (09/26 0559) Pulse Rate:  [40-98] 79 (09/26 0230) Resp:  [14-22] 18 (09/26 0230) BP: (111-157)/(83-111) 131/87 (09/26 0230) SpO2:  [94 %-100 %] 97 % (09/26 0230) Weight:  [85.7 kg] 85.7 kg (09/25 1217)  Constitutional:  Alert and oriented, No acute distress Cardiovascular: Regular rate Respiratory: Normal respiratory effort GI: Abdomen is soft, mildly tender to palpation in lower abdomen GU: No CVA tenderness. External exam without blood at meatus.  External urinary device in place with urine in canister and tubing thin maroon-colored Neurologic: Grossly intact, no focal deficits Psychiatric: Normal mood and affect  Laboratory Data:  Recent Labs    07/10/22 1223 07/11/22 0600  WBC 8.9 8.5  HGB 11.2* 9.9*  HCT 36.4 31.2*  PLT 329 251    Recent Labs    07/10/22 1223  NA 142  K 4.2  CL 110  GLUCOSE 99  BUN 19  CALCIUM 9.1  CREATININE 1.10*     Results for orders placed or performed during the hospital encounter of 07/10/22 (from the past 24 hour(s))  CBC with Differential     Status: Abnormal   Collection Time: 07/10/22 12:23 PM  Result Value Ref Range   WBC 8.9 4.0 - 10.5 K/uL   RBC 4.18 3.87 - 5.11 MIL/uL   Hemoglobin 11.2 (L) 12.0 - 15.0 g/dL   HCT 36.4 36.0 - 46.0 %   MCV 87.1 80.0 - 100.0 fL   MCH 26.8 26.0 - 34.0 pg   MCHC 30.8 30.0 - 36.0 g/dL   RDW 16.1 (H) 11.5 - 15.5 %   Platelets 329 150 - 400 K/uL   nRBC 0.0 0.0 - 0.2 %   Neutrophils Relative % 53 %   Neutro Abs 4.7 1.7 - 7.7 K/uL   Lymphocytes Relative 35 %   Lymphs Abs 3.1 0.7 - 4.0 K/uL   Monocytes Relative 9 %   Monocytes Absolute 0.8 0.1 - 1.0 K/uL   Eosinophils Relative 2 %   Eosinophils Absolute 0.1 0.0 - 0.5 K/uL   Basophils Relative 1 %    Basophils Absolute 0.1 0.0 - 0.1 K/uL   Immature Granulocytes 0 %   Abs Immature Granulocytes 0.02 0.00 - 0.07 K/uL  Comprehensive metabolic panel     Status: Abnormal   Collection Time: 07/10/22 12:23 PM  Result Value Ref Range   Sodium 142 135 - 145 mmol/L   Potassium 4.2 3.5 - 5.1 mmol/L   Chloride 110 98 - 111 mmol/L   CO2 25 22 - 32 mmol/L   Glucose, Bld 99 70 - 99 mg/dL   BUN 19 8 - 23 mg/dL   Creatinine,  Ser 1.10 (H) 0.44 - 1.00 mg/dL   Calcium 9.1 8.9 - 10.3 mg/dL   Total Protein 7.4 6.5 - 8.1 g/dL   Albumin 3.5 3.5 - 5.0 g/dL   AST 14 (L) 15 - 41 U/L   ALT 14 0 - 44 U/L   Alkaline Phosphatase 67 38 - 126 U/L   Total Bilirubin 0.8 0.3 - 1.2 mg/dL   GFR, Estimated 55 (L) >60 mL/min   Anion gap 7 5 - 15  Lipase, blood     Status: None   Collection Time: 07/10/22 12:23 PM  Result Value Ref Range   Lipase 26 11 - 51 U/L  Brain natriuretic peptide     Status: None   Collection Time: 07/10/22  7:20 PM  Result Value Ref Range   B Natriuretic Peptide 79.2 0.0 - 100.0 pg/mL  Troponin I (High Sensitivity)     Status: None   Collection Time: 07/10/22  7:20 PM  Result Value Ref Range   Troponin I (High Sensitivity) 17 <18 ng/L  Troponin I (High Sensitivity)     Status: Abnormal   Collection Time: 07/10/22 11:16 PM  Result Value Ref Range   Troponin I (High Sensitivity) 24 (H) <18 ng/L  Protime-INR     Status: Abnormal   Collection Time: 07/10/22 11:16 PM  Result Value Ref Range   Prothrombin Time 47.0 (H) 11.4 - 15.2 seconds   INR 5.1 (HH) 0.8 - 1.2  Urinalysis, Routine w reflex microscopic     Status: Abnormal   Collection Time: 07/11/22  1:00 AM  Result Value Ref Range   Color, Urine RED (A) YELLOW   APPearance TURBID (A) CLEAR   Specific Gravity, Urine  1.005 - 1.030    TEST NOT REPORTED DUE TO COLOR INTERFERENCE OF URINE PIGMENT   pH  5.0 - 8.0    TEST NOT REPORTED DUE TO COLOR INTERFERENCE OF URINE PIGMENT   Glucose, UA (A) NEGATIVE mg/dL    TEST NOT REPORTED  DUE TO COLOR INTERFERENCE OF URINE PIGMENT   Hgb urine dipstick (A) NEGATIVE    TEST NOT REPORTED DUE TO COLOR INTERFERENCE OF URINE PIGMENT   Bilirubin Urine (A) NEGATIVE    TEST NOT REPORTED DUE TO COLOR INTERFERENCE OF URINE PIGMENT   Ketones, ur (A) NEGATIVE mg/dL    TEST NOT REPORTED DUE TO COLOR INTERFERENCE OF URINE PIGMENT   Protein, ur (A) NEGATIVE mg/dL    TEST NOT REPORTED DUE TO COLOR INTERFERENCE OF URINE PIGMENT   Nitrite (A) NEGATIVE    TEST NOT REPORTED DUE TO COLOR INTERFERENCE OF URINE PIGMENT   Leukocytes,Ua (A) NEGATIVE    TEST NOT REPORTED DUE TO COLOR INTERFERENCE OF URINE PIGMENT  Urinalysis, Microscopic (reflex)     Status: Abnormal   Collection Time: 07/11/22  1:00 AM  Result Value Ref Range   RBC / HPF >50 0 - 5 RBC/hpf   WBC, UA 6-10 0 - 5 WBC/hpf   Bacteria, UA MANY (A) NONE SEEN   Squamous Epithelial / LPF NONE SEEN 0 - 5   Non Squamous Epithelial NONE SEEN NONE SEEN  CBC with Differential/Platelet     Status: Abnormal   Collection Time: 07/11/22  6:00 AM  Result Value Ref Range   WBC 8.5 4.0 - 10.5 K/uL   RBC 3.63 (L) 3.87 - 5.11 MIL/uL   Hemoglobin 9.9 (L) 12.0 - 15.0 g/dL   HCT 31.2 (L) 36.0 - 46.0 %   MCV 86.0 80.0 - 100.0  fL   MCH 27.3 26.0 - 34.0 pg   MCHC 31.7 30.0 - 36.0 g/dL   RDW 16.2 (H) 11.5 - 15.5 %   Platelets 251 150 - 400 K/uL   nRBC 0.0 0.0 - 0.2 %   Neutrophils Relative % 55 %   Neutro Abs 4.8 1.7 - 7.7 K/uL   Lymphocytes Relative 33 %   Lymphs Abs 2.8 0.7 - 4.0 K/uL   Monocytes Relative 9 %   Monocytes Absolute 0.8 0.1 - 1.0 K/uL   Eosinophils Relative 2 %   Eosinophils Absolute 0.1 0.0 - 0.5 K/uL   Basophils Relative 1 %   Basophils Absolute 0.1 0.0 - 0.1 K/uL   Immature Granulocytes 0 %   Abs Immature Granulocytes 0.01 0.00 - 0.07 K/uL   No results found for this or any previous visit (from the past 240 hour(s)).  Renal Function: Recent Labs    07/10/22 1223  CREATININE 1.10*   Estimated Creatinine Clearance:  51.9 mL/min (A) (by C-G formula based on SCr of 1.1 mg/dL (H)).  Radiologic Imaging: US PELVIC COMPLETE W TRANSVAGINAL AND TORSION R/O  Result Date: 07/10/2022 CLINICAL DATA:  Pelvic pain and vaginal bleeding EXAM: TRANSABDOMINAL AND TRANSVAGINAL ULTRASOUND OF PELVIS DOPPLER ULTRASOUND OF OVARIES TECHNIQUE: Both transabdominal and transvaginal ultrasound examinations of the pelvis were performed. Transabdominal technique was performed for global imaging of the pelvis including uterus, ovaries, adnexal regions, and pelvic cul-de-sac. It was necessary to proceed with endovaginal exam following the transabdominal exam to visualize the uterus, endometrium, ovaries, and adnexa. Color and duplex Doppler ultrasound was utilized to evaluate blood flow to the ovaries. COMPARISON:  Pelvic ultrasound 06/17/2020 and same day CT abdomen and pelvis FINDINGS: Uterus Measurements: 6.2 x 3.6 x 4.9 cm = volume: 56 mL. Round heterogenous predominantly hypoechoic mass within the anterior uterine fundus measuring 1.8 x 1.4 x 1.7 cm compatible with fibroid. Endometrium Thickness: 1.3 mm.  No focal abnormality visualized. Right ovary Not visualized Left ovary Not visualized Pulsed Doppler evaluation of both ovaries demonstrates normal low-resistance arterial and venous waveforms. Other findings No abnormal free fluid. IMPRESSION: 1. No acute sonographic abnormality in the pelvis. 2. Fibroid within the anterior uterine fundus with some mild distortion of the endometrial canal. 3. No focal endometrial abnormality or significant endometrial thickening. 4. Nonvisualization of the ovaries. Electronically Signed   By: Placido Sou M.D.   On: 07/10/2022 20:01   CT Renal Stone Study  Result Date: 07/10/2022 CLINICAL DATA:  Lower abdominal pain with hematuria x3 days. EXAM: CT ABDOMEN AND PELVIS WITHOUT CONTRAST TECHNIQUE: Multidetector CT imaging of the abdomen and pelvis was performed following the standard protocol without IV  contrast. RADIATION DOSE REDUCTION: This exam was performed according to the departmental dose-optimization program which includes automated exposure control, adjustment of the mA and/or kV according to patient size and/or use of iterative reconstruction technique. COMPARISON:  CT December 10, 2017. FINDINGS: Lower chest: No acute abnormality. Hepatobiliary: Unremarkable noncontrast enhanced appearance of the hepatic parenchyma. Gallbladder surgically absent. No biliary ductal dilation. Pancreas: No pancreatic ductal dilation or evidence of acute inflammation. Spleen: No splenomegaly. Adrenals/Urinary Tract: Bilateral adrenal glands are within normal limits. No hydronephrosis. Nonobstructive punctate left upper/interpolar renal stone. Hypodense 2.1 cm left renal lesion measures fluid density consistent with a cyst and considered benign requiring no independent imaging follow-up. Subcentimeter hypodense right renal lesion measures 9 mm on image 30/2, technically too small to accurately characterize but statistically likely to reflect a cyst. No renal, ureteral bladder  calculi. Gas in the urinary bladder recommend correlation with history of recent instrumentation. Stomach/Bowel: No radiopaque enteric contrast material was administered. Masslike lobular area along the greater curvature of the gastric fundus best seen on coronal image 80/4 measuring 2.9 x 3.9 x 3.7 cm on images 18/2 and 80/4. No pathologic dilation of small or large bowel. Appendix and terminal ileum appear normal. Colonic diverticulosis without findings of acute diverticulitis. Vascular/Lymphatic: Aneurysmal dilation of the infrarenal abdominal aorta measures 3.4 cm on image 47/2 previously measuring 3.1 cm on CT December 10, 2017. 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 No pathologically enlarged abdominal or pelvic lymph nodes. Reproductive: Uterus and bilateral adnexa are  unremarkable. Other: No significant abdominopelvic free fluid. Musculoskeletal: No acute osseous abnormality. Multilevel degenerative changes spine. IMPRESSION: 1. Nonobstructive punctate left upper/interpolar renal stone. No obstructive ureteral or bladder calculi. 2. Masslike 3.9 cm lobular exophytic area along the greater curvature of the gastric fundus is nonspecific possibly reflecting a focal outpouching and fold thickening but is suspicious for a discrete gastric mass such as a gastrointestinal stromal tumor. Suggest gastroenterology consult and further evaluation with nonemergent CT with oral and IV contrast versus upper endoscopy. 3. Colonic diverticulosis without findings of acute diverticulitis. 4. Increased size of the infrarenal abdominal aortic aneurysm now measuring 3.4 cm Recommend follow-up ultrasound every 3 years. This recommendation follows ACR consensus guidelines: White Paper of the ACR Incidental Findings Committee II on Vascular Findings. J Am Coll Radiol 2013; 10:789-794. 5. 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 6.  Aortic Atherosclerosis (ICD10-I70.0). Electronically Signed   By: Dahlia Bailiff M.D.   On: 07/10/2022 14:14    I independently reviewed the above imaging studies.  Impression: 66 year old female with history of uterine bleeding found to have a uterine polyp, pulmonary embolism on warfarin who presented to the emergency department with 3 days of gross hematuria.  Patient is currently afebrile and hemodynamically stable. Hemoglobin initially 11.2 down trended to 9.9 this morning.  She has not required any blood transfusions at this time.  Creatinine 1.1.  INR 5.1, she has not been reversed.  Warfarin being held. Urinalysis with many bacteria concerning for infection.  Urine culture in process.  Has not received any IV antibiotics at this time.  Voiding spontaneously at this time without issue.  Noncontrasted CT scan notable for  punctate left renal stone without any other abnormalities of her urinary tract or signs of obstruction.  CT renal stone protocol with punctate nonobstructing stone in left kidney, left renal cyst and very small right renal mass too small to characterize without any other obvious abnormalities of her kidneys, ureter, bladder or signs of obstruction.  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  Discussed with patient the typical gross hematuria work-up which includes CT urogram and cystoscopy to evaluate for etiology of the hematuria.  Discussed with patient the common etiologies of gross hematuria which include but are not limited to, infection, inflammation, urolithiasis, malignancy.  Recommendation - No indication for acute urologic surgical intervention at this time. - Please obtain PVR to ensure patient is emptying bladder well. - Due to concern for possible urinary tract infection, would start patient on empiric IV antibiotics.  Follow-up culture and tailor appropriately. - Agree with continuing to hold warfarin at this time as her INR is supratherapeutic and also in the setting of gross hematuria. - Continue to trend hemoglobin. Please  alert urology team if she requires blood transfusions. - Recommend obtaining CT urogram for further evaluation of etiology of her gross hematuria.  - She will need cystoscopy to evaluate lower tract at some point which can be done outpatient if patient remains stable.   Coralyn Pear, MD Resident physician [facility] 07/11/2022, 6:29 AM

## 2022-07-11 NOTE — H&P (Addendum)
History and Physical    Kristi Meyers:[id number] DOB: 01-27-1956 DOA: 07/10/2022  PCP: Billie Ruddy, MD  Patient coming from: home  I have personally briefly reviewed patient's old medical records in [place]  Chief Complaint: c/o lower abdominal pain / blood in urine 3 days/vaginal bleeding  HPI: Kristi Meyers is a 66 y.o. female with medical history significant of left lower extremity DVT, saddle PE,, on Coumadin, AAA, HLD, GERD , hx of endometrial polyp with vaginal bleeding 2021 who present to ED with complaint of lower abdominal pain associated with vaginal bleeding x 5 days which she attributes to her history of fibroids. In addition patient also noted  associated with blood urine x 3 days . She noted no sob/ chest pain / cough /fever/ chills/diarrhea or blood in stools.    ED Course:  Vitals afeb, bp 111/83, hr 44, rr 18, sat 96% on ra  Ekg: Labs: Wbc 8.9, hgb 11.2 repeat 9.9, plt 329  Na 142, K 4.2, cr 1.1   Ctscan abd/pelvis IMPRESSION: 1. Nonobstructive punctate left upper/interpolar renal stone. No obstructive ureteral or bladder calculi. 2. Masslike 3.9 cm lobular exophytic area along the greater curvature of the gastric fundus is nonspecific possibly reflecting a focal outpouching and fold thickening but is suspicious for a discrete gastric mass such as a gastrointestinal stromal tumor. Suggest gastroenterology consult and further evaluation with nonemergent CT with oral and IV contrast versus upper endoscopy. 3. Colonic diverticulosis without findings of acute diverticulitis. 4. Increased size of the infrarenal abdominal aortic aneurysm now measuring 3.4 cm Recommend follow-up ultrasound every 3 years. This recommendation follows ACR consensus guidelines: White Paper of the ACR Incidental Findings Committee II on Vascular Findings. J Am Coll Radiol 2013; 10:789-794. 5. 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 6.  Aortic Atherosclerosis (ICD10-I70.0). Review of Systems: As per HPI otherwise 10 point review of systems negative.   Past Medical History:  Diagnosis Date   Abnormal EKG    hx of left bundle branch block    Anginal pain (HCC)    stable   GERD (gastroesophageal reflux disease)    Heart murmur    soft systolic per dr Terrence Dupont [id number] lov   High cholesterol    Hypertension    Left leg DVT ([place]) 11/2017   pulmonary embolus   Migraine    PMB (postmenopausal bleeding)    Vertigo     Past Surgical History:  Procedure Laterality Date   CHOLECYSTECTOMY  2011   [place] N/A 09/07/2020   Procedure: DILATATION & CURETTAGE/HYSTEROSCOPY WITH MYOSURE;  Surgeon: Salvadore Dom, MD;  Location: [facility];  Service: Gynecology;  Laterality: N/A;   IR ANGIOGRAM PULMONARY BILATERAL SELECTIVE  12/10/2017   IR ANGIOGRAM SELECTIVE EACH ADDITIONAL VESSEL  12/10/2017   IR ANGIOGRAM SELECTIVE EACH ADDITIONAL VESSEL  12/10/2017   IR INFUSION THROMBOL ARTERIAL INITIAL (MS)  12/10/2017   IR INFUSION THROMBOL ARTERIAL INITIAL (MS)  12/10/2017   IR THROMB F/U EVAL ART/VEN FINAL DAY (MS)  12/11/2017   IR US GUIDE VASC ACCESS RIGHT  12/10/2017     reports that she quit smoking about 4 years ago. Her smoking use included cigarettes. She has a 5.00 pack-year smoking history. She has never used smokeless tobacco. She reports current drug use. Drug: Marijuana. She reports that she does not drink alcohol.  No Known Allergies  FH HTN Cancer nos aunt Prior to Admission  medications   Medication Sig Start Date End Date Taking? Authorizing Provider  acetaminophen (TYLENOL) 500 MG tablet Take 500 mg by mouth every 6 (six) hours as needed.   Yes [provider]  amLODipine (NORVASC) 5 MG tablet Take 5 mg by mouth daily. 04/15/22  Yes [provider]  atorvastatin (LIPITOR) 40 MG tablet Take 40 mg by mouth at bedtime.    Yes [provider]  carvedilol (COREG) 25 MG tablet Take 25 mg by mouth 2 (two) times daily with a meal.   Yes [provider]  losartan (COZAAR) 100 MG tablet Take 100 mg by mouth daily. 04/30/22  Yes [provider]  meclizine (ANTIVERT) 12.5 MG tablet Take 12.5 mg by mouth 2 (two) times daily.   Yes [provider]  nitroGLYCERIN (NITROSTAT) 0.4 MG SL tablet Place under the tongue. 06/29/22  Yes [provider]  warfarin (COUMADIN) 4 MG tablet Take 4 mg by mouth. 4 mg daily and 2 mg on sunday   Yes [provider]  traMADol (ULTRAM) 50 MG tablet Take 1 tablet (50 mg total) by mouth every 8 (eight) hours as needed for severe pain. Patient not taking: Reported on 09/20/2020 10/29/17   Jaynee Eagles, PA-C  warfarin (COUMADIN) 5 MG tablet Take 5 mg by mouth daily. 05/12/20   [provider]    Physical Exam: Vitals:   07/11/22 0030 07/11/22 0100 07/11/22 0230 07/11/22 0559  BP: (!) 134/90 (!) 126/111 131/87   Pulse: 89 86 79   Resp: (!) '21 17 18   '$ Temp:  98.2 F (36.8 C)  98.2 F (36.8 C)  TempSrc:    Oral  SpO2: 98% 97% 97%   Weight:      Height:         Vitals:   07/11/22 0030 07/11/22 0100 07/11/22 0230 07/11/22 0559  BP: (!) 134/90 (!) 126/111 131/87   Pulse: 89 86 79   Resp: (!) '21 17 18   '$ Temp:  98.2 F (36.8 C)  98.2 F (36.8 C)  TempSrc:    Oral  SpO2: 98% 97% 97%   Weight:      Height:      Constitutional: NAD, calm, comfortable Eyes: PERRL, lids and conjunctivae normal ENMT: Mucous membranes are moist. Posterior pharynx clear of any exudate or lesions.Normal dentition.  Neck: normal, supple, no masses, no thyromegaly Respiratory: clear to auscultation bilaterally, no wheezing, no crackles. Normal respiratory effort. No accessory muscle use.  Cardiovascular: Regular rate and rhythm, no murmurs / rubs / gallops. No extremity edema. 2+ pedal pulses. No carotid bruits.  Abdomen: lower quad /suprapubic tenderness, no masses  palpated. No hepatosplenomegaly. Bowel sounds positive.  Musculoskeletal: no clubbing / cyanosis. No joint deformity upper and lower extremities. Good ROM, no contractures. Normal muscle tone.  Skin: no rashes, lesions, ulcers. No induration Neurologic: CN 2-12 grossly intact. Sensation intact, Strength 5/5 in all 4.  Psychiatric: Normal judgment and insight. Alert and oriented x 3. Normal mood.    Labs on Admission: I have personally reviewed following labs and imaging studies  CBC: Recent Labs  Lab 07/10/22 1223 07/11/22 0600  WBC 8.9 8.5  NEUTROABS 4.7 4.8  HGB 11.2* 9.9*  HCT 36.4 31.2*  MCV 87.1 86.0  PLT 329 161   Basic Metabolic Panel: Recent Labs  Lab 07/10/22 1223 07/11/22 0600  NA 142 143  K 4.2 4.1  CL 110 112*  CO2 25 23  GLUCOSE 99 86  BUN 19  21  CREATININE 1.10* 0.95  CALCIUM 9.1 8.8*  MG  --  1.8   GFR: Estimated Creatinine Clearance: 60 mL/min (by C-G formula based on SCr of 0.95 mg/dL). Liver Function Tests: Recent Labs  Lab 07/10/22 1223 07/11/22 0600  AST 14* 15  ALT 14 15  ALKPHOS 67 65  BILITOT 0.8 0.9  PROT 7.4 6.7  ALBUMIN 3.5 3.2*   Recent Labs  Lab 07/10/22 1223  LIPASE 26   No results for input(s): "AMMONIA" in the last 168 hours. Coagulation Profile: Recent Labs  Lab 07/10/22 2316  INR 5.1*   Cardiac Enzymes: No results for input(s): "CKTOTAL", "CKMB", "CKMBINDEX", "TROPONINI" in the last 168 hours. BNP (last 3 results) No results for input(s): "PROBNP" in the last 8760 hours. HbA1C: No results for input(s): "HGBA1C" in the last 72 hours. CBG: No results for input(s): "GLUCAP" in the last 168 hours. Lipid Profile: No results for input(s): "CHOL", "HDL", "LDLCALC", "TRIG", "CHOLHDL", "LDLDIRECT" in the last 72 hours. Thyroid Function Tests: No results for input(s): "TSH", "T4TOTAL", "FREET4", "T3FREE", "THYROIDAB" in the last 72 hours. Anemia Panel: No results for input(s): "VITAMINB12", "FOLATE", "FERRITIN", "TIBC",  "IRON", "RETICCTPCT" in the last 72 hours. Urine analysis:    Component Value Date/Time   COLORURINE RED (A) 07/11/2022 0100   APPEARANCEUR TURBID (A) 07/11/2022 0100   LABSPEC  07/11/2022 0100    TEST NOT REPORTED DUE TO COLOR INTERFERENCE OF URINE PIGMENT   PHURINE  07/11/2022 0100    TEST NOT REPORTED DUE TO COLOR INTERFERENCE OF URINE PIGMENT   GLUCOSEU (A) 07/11/2022 0100    TEST NOT REPORTED DUE TO COLOR INTERFERENCE OF URINE PIGMENT   HGBUR (A) 07/11/2022 0100    TEST NOT REPORTED DUE TO COLOR INTERFERENCE OF URINE PIGMENT   BILIRUBINUR (A) 07/11/2022 0100    TEST NOT REPORTED DUE TO COLOR INTERFERENCE OF URINE PIGMENT   BILIRUBINUR Negative 07/06/2020 1136   KETONESUR (A) 07/11/2022 0100    TEST NOT REPORTED DUE TO COLOR INTERFERENCE OF URINE PIGMENT   PROTEINUR (A) 07/11/2022 0100    TEST NOT REPORTED DUE TO COLOR INTERFERENCE OF URINE PIGMENT   UROBILINOGEN negative (A) 07/06/2020 1136   NITRITE (A) 07/11/2022 0100    TEST NOT REPORTED DUE TO COLOR INTERFERENCE OF URINE PIGMENT   LEUKOCYTESUR (A) 07/11/2022 0100    TEST NOT REPORTED DUE TO COLOR INTERFERENCE OF URINE PIGMENT    Radiological Exams on Admission: US PELVIC COMPLETE W TRANSVAGINAL AND TORSION R/O  Result Date: 07/10/2022 CLINICAL DATA:  Pelvic pain and vaginal bleeding EXAM: TRANSABDOMINAL AND TRANSVAGINAL ULTRASOUND OF PELVIS DOPPLER ULTRASOUND OF OVARIES TECHNIQUE: Both transabdominal and transvaginal ultrasound examinations of the pelvis were performed. Transabdominal technique was performed for global imaging of the pelvis including uterus, ovaries, adnexal regions, and pelvic cul-de-sac. It was necessary to proceed with endovaginal exam following the transabdominal exam to visualize the uterus, endometrium, ovaries, and adnexa. Color and duplex Doppler ultrasound was utilized to evaluate blood flow to the ovaries. COMPARISON:  Pelvic ultrasound 06/17/2020 and same day CT abdomen and pelvis FINDINGS: Uterus  Measurements: 6.2 x 3.6 x 4.9 cm = volume: 56 mL. Round heterogenous predominantly hypoechoic mass within the anterior uterine fundus measuring 1.8 x 1.4 x 1.7 cm compatible with fibroid. Endometrium Thickness: 1.3 mm.  No focal abnormality visualized. Right ovary Not visualized Left ovary Not visualized Pulsed Doppler evaluation of both ovaries demonstrates normal low-resistance arterial and venous waveforms. Other findings No abnormal free fluid. IMPRESSION: 1. No acute  sonographic abnormality in the pelvis. 2. Fibroid within the anterior uterine fundus with some mild distortion of the endometrial canal. 3. No focal endometrial abnormality or significant endometrial thickening. 4. Nonvisualization of the ovaries. Electronically Signed   By: Placido Sou M.D.   On: 07/10/2022 20:01   CT Renal Stone Study  Result Date: 07/10/2022 CLINICAL DATA:  Lower abdominal pain with hematuria x3 days. EXAM: CT ABDOMEN AND PELVIS WITHOUT CONTRAST TECHNIQUE: Multidetector CT imaging of the abdomen and pelvis was performed following the standard protocol without IV contrast. RADIATION DOSE REDUCTION: This exam was performed according to the departmental dose-optimization program which includes automated exposure control, adjustment of the mA and/or kV according to patient size and/or use of iterative reconstruction technique. COMPARISON:  CT December 10, 2017. FINDINGS: Lower chest: No acute abnormality. Hepatobiliary: Unremarkable noncontrast enhanced appearance of the hepatic parenchyma. Gallbladder surgically absent. No biliary ductal dilation. Pancreas: No pancreatic ductal dilation or evidence of acute inflammation. Spleen: No splenomegaly. Adrenals/Urinary Tract: Bilateral adrenal glands are within normal limits. No hydronephrosis. Nonobstructive punctate left upper/interpolar renal stone. Hypodense 2.1 cm left renal lesion measures fluid density consistent with a cyst and considered benign requiring no independent  imaging follow-up. Subcentimeter hypodense right renal lesion measures 9 mm on image 30/2, technically too small to accurately characterize but statistically likely to reflect a cyst. No renal, ureteral bladder calculi. Gas in the urinary bladder recommend correlation with history of recent instrumentation. Stomach/Bowel: No radiopaque enteric contrast material was administered. Masslike lobular area along the greater curvature of the gastric fundus best seen on coronal image 80/4 measuring 2.9 x 3.9 x 3.7 cm on images 18/2 and 80/4. No pathologic dilation of small or large bowel. Appendix and terminal ileum appear normal. Colonic diverticulosis without findings of acute diverticulitis. Vascular/Lymphatic: Aneurysmal dilation of the infrarenal abdominal aorta measures 3.4 cm on image 47/2 previously measuring 3.1 cm on CT December 10, 2017. 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 No pathologically enlarged abdominal or pelvic lymph nodes. Reproductive: Uterus and bilateral adnexa are unremarkable. Other: No significant abdominopelvic free fluid. Musculoskeletal: No acute osseous abnormality. Multilevel degenerative changes spine. IMPRESSION: 1. Nonobstructive punctate left upper/interpolar renal stone. No obstructive ureteral or bladder calculi. 2. Masslike 3.9 cm lobular exophytic area along the greater curvature of the gastric fundus is nonspecific possibly reflecting a focal outpouching and fold thickening but is suspicious for a discrete gastric mass such as a gastrointestinal stromal tumor. Suggest gastroenterology consult and further evaluation with nonemergent CT with oral and IV contrast versus upper endoscopy. 3. Colonic diverticulosis without findings of acute diverticulitis. 4. Increased size of the infrarenal abdominal aortic aneurysm now measuring 3.4 cm Recommend follow-up ultrasound every 3 years. This recommendation follows ACR  consensus guidelines: White Paper of the ACR Incidental Findings Committee II on Vascular Findings. J Am Coll Radiol 2013; 10:789-794. 5. 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 6.  Aortic Atherosclerosis (ICD10-I70.0). Electronically Signed   By: Dahlia Bailiff M.D.   On: 07/10/2022 14:14    EKG: Independently reviewed.   Assessment/Plan   Gross  Hematuria  - in setting of supra-therapeutic inr - hold AC currently  -monitor h/h  -transfuse if persistent bleeding and more than 2 point drop  -Urology recs's noted if patient h/h continue to drop due to persistent hematuria , urology will need to be alerted.  -PVR ordered  -CT urogram ordered  -will need  to f/u on further urology recs -h/h stable on repeat   Vaginal bleeding -hx of fibroids and endometrial polyp s/p polypectomy 2021 -Gyn exam per notes , noted minimal bleeding  -will need to follow with Gyn as outpatient   Lobular exophytic area Along greater curvature  -r/o gastric stromal tumor  -gi consulted  -ppi bid    Hx of DVT/PE -on  warfarin  -now with supra- therapeutic inr  -hold warfarin    HTN -hold arb currently due to active bleeding  -continue to monitor , resume once bleeding controlled    AAA -no active issues   HLD -resume statin   GERD  -ppi  DVT prophylaxis:  Scd  Code Status: full code Family Communication: n/a Disposition Plan: patient  expected to be admitted greater than 2 midnights  Consults called: Urology:Abimbola, Obafunbi, MD. GI: Admission status: inpatient    Clance Boll MD [facility]   If 7PM-7AM, please contact night-coverage [email or web address] Password [facility]  07/11/2022, 7:41 AM

## 2022-07-11 NOTE — ED Provider Notes (Signed)
Patient signed out to me by Dr. Armandina Gemma.  Patient had initially presented with complaints of dizziness, weakness in the setting of noticing progressively worsening blood in her urine for the last 3 or 4 days.  Patient had an episode of vaginal bleeding in 2021 that was felt to be secondary to uterine polyp that was removed.  Pelvic exam performed by Dr. Armandina Gemma reports some blood in the vaginal vault but no active bleeding through the cervix.  In and out urine collection was gross blood.  This is consistent with hematuria, not vaginal bleeding.  This in the setting of supratherapeutic INR above 5.  Patient is on Coumadin because of prior PE.  I am therefore not comfortable aggressively correcting her INR.  Initial hemoglobin was not below her baseline.  She will require repeat H&H to follow blood loss, as she continues to put out gross blood in her urine here in the department.  We will ask for hospitalist admission.  Patient did have troponins ordered because of her shortness of breath, weakness and dizziness.  First 1 was 17, second 1 slightly elevated at 24. EKG shows chronic LBBB, no active chest pain at this time.   Orpah Greek, MD 07/11/22 [phone]

## 2022-07-12 ENCOUNTER — Encounter (HOSPITAL_COMMUNITY): Payer: Self-pay | Admitting: Internal Medicine

## 2022-07-12 ENCOUNTER — Observation Stay (HOSPITAL_COMMUNITY): Payer: Medicare HMO

## 2022-07-12 DIAGNOSIS — Z1211 Encounter for screening for malignant neoplasm of colon: Secondary | ICD-10-CM | POA: Diagnosis not present

## 2022-07-12 DIAGNOSIS — R933 Abnormal findings on diagnostic imaging of other parts of digestive tract: Secondary | ICD-10-CM | POA: Diagnosis not present

## 2022-07-12 DIAGNOSIS — R310 Gross hematuria: Secondary | ICD-10-CM | POA: Diagnosis not present

## 2022-07-12 DIAGNOSIS — R1084 Generalized abdominal pain: Secondary | ICD-10-CM

## 2022-07-12 DIAGNOSIS — Z7901 Long term (current) use of anticoagulants: Secondary | ICD-10-CM

## 2022-07-12 DIAGNOSIS — R110 Nausea: Secondary | ICD-10-CM | POA: Diagnosis not present

## 2022-07-12 LAB — HEMOGLOBIN AND HEMATOCRIT, BLOOD
HCT: 30.8 % — ABNORMAL LOW (ref 36.0–46.0)
HCT: 35.8 % — ABNORMAL LOW (ref 36.0–46.0)
Hemoglobin: 9.6 g/dL — ABNORMAL LOW (ref 12.0–15.0)
Hemoglobin: 11.1 g/dL — ABNORMAL LOW (ref 12.0–15.0)

## 2022-07-12 LAB — PROTIME-INR
INR: 3.8 — ABNORMAL HIGH (ref 0.8–1.2)
Prothrombin Time: 36.8 seconds — ABNORMAL HIGH (ref 11.4–15.2)

## 2022-07-12 LAB — CBC
HCT: 33.1 % — ABNORMAL LOW (ref 36.0–46.0)
Hemoglobin: 10.5 g/dL — ABNORMAL LOW (ref 12.0–15.0)
MCH: 27.5 pg (ref 26.0–34.0)
MCHC: 31.7 g/dL (ref 30.0–36.0)
MCV: 86.6 fL (ref 80.0–100.0)
Platelets: 278 10*3/uL (ref 150–400)
RBC: 3.82 MIL/uL — ABNORMAL LOW (ref 3.87–5.11)
RDW: 16 % — ABNORMAL HIGH (ref 11.5–15.5)
WBC: 9.9 10*3/uL (ref 4.0–10.5)
nRBC: 0 % (ref 0.0–0.2)

## 2022-07-12 MED ORDER — IOHEXOL 300 MG/ML  SOLN
100.0000 mL | Freq: Once | INTRAMUSCULAR | Status: AC | PRN
  Administered 2022-07-12: 100 mL via INTRAVENOUS

## 2022-07-12 MED ORDER — MORPHINE SULFATE (PF) 4 MG/ML IV SOLN
4.0000 mg | INTRAVENOUS | Status: DC | PRN
  Administered 2022-07-12 – 2022-07-18 (×18): 4 mg via INTRAVENOUS
  Filled 2022-07-12 (×20): qty 1

## 2022-07-12 MED ORDER — MORPHINE SULFATE (PF) 2 MG/ML IV SOLN
2.0000 mg | Freq: Once | INTRAVENOUS | Status: AC
  Administered 2022-07-12: 2 mg via INTRAVENOUS
  Filled 2022-07-12: qty 1

## 2022-07-12 MED ORDER — SODIUM CHLORIDE (PF) 0.9 % IJ SOLN
INTRAMUSCULAR | Status: AC
  Filled 2022-07-12: qty 50

## 2022-07-12 MED ORDER — TRAMADOL HCL 50 MG PO TABS
25.0000 mg | ORAL_TABLET | Freq: Once | ORAL | Status: AC
  Administered 2022-07-12: 25 mg via ORAL
  Filled 2022-07-12: qty 1

## 2022-07-12 MED ORDER — OXYCODONE HCL 5 MG PO TABS
5.0000 mg | ORAL_TABLET | ORAL | Status: DC | PRN
  Administered 2022-07-12 – 2022-07-18 (×13): 5 mg via ORAL
  Filled 2022-07-12 (×13): qty 1

## 2022-07-12 NOTE — ED Notes (Signed)
Spoke with admitting NP.  Quinn Plowman, he is aware of the repeat Hgb of 9.6.  He is ordering additional pain medications.

## 2022-07-12 NOTE — Consult Note (Signed)
Subjective: Pt afebrile and stable.  Voiding spontaneously, urine remains thin and cherry in color. Hgb stable, 9.6 this morning. CTU pending.  Objective: Vital signs in last 24 hours: Temp:  [97.5 F (36.4 C)-98.5 F (36.9 C)] 97.5 F (36.4 C) (09/27 0541) Pulse Rate:  [62-88] 78 (09/27 0541) Resp:  [15-22] 18 (09/27 0541) BP: (120-158)/(72-108) 154/108 (09/27 0541) SpO2:  [93 %-100 %] 95 % (09/27 0541)  Intake/Output from previous day: 09/26 0701 - 09/27 0700 In: 1766.3 [I.V.:666.3; IV Piggyback:1100] Out: 350 [Urine:350] Intake/Output this shift: Total I/O In: -  Out: 350 [Urine:350]  Physical Exam:  General: Alert and oriented CV: Regular rate Lungs: Normal work of breathing on room air GU: External urinary device in place with urine in canister and tubing thin cherry colored Ext: NT, No erythema  Lab Results: Recent Labs    07/11/22 1341 07/11/22 2027 07/12/22 0455  HGB 10.2* 10.1* 9.6*  HCT 32.5* 32.2* 30.8*   BMET Recent Labs    07/10/22 1223 07/11/22 0600  NA 142 143  K 4.2 4.1  CL 110 112*  CO2 25 23  GLUCOSE 99 86  BUN 19 21  CREATININE 1.10* 0.95  CALCIUM 9.1 8.8*     Studies/Results: US PELVIC COMPLETE W TRANSVAGINAL AND TORSION R/O  Result Date: 07/10/2022 CLINICAL DATA:  Pelvic pain and vaginal bleeding EXAM: TRANSABDOMINAL AND TRANSVAGINAL ULTRASOUND OF PELVIS DOPPLER ULTRASOUND OF OVARIES TECHNIQUE: Both transabdominal and transvaginal ultrasound examinations of the pelvis were performed. Transabdominal technique was performed for global imaging of the pelvis including uterus, ovaries, adnexal regions, and pelvic cul-de-sac. It was necessary to proceed with endovaginal exam following the transabdominal exam to visualize the uterus, endometrium, ovaries, and adnexa. Color and duplex Doppler ultrasound was utilized to evaluate blood flow to the ovaries. COMPARISON:  Pelvic ultrasound 06/17/2020 and same day CT abdomen and pelvis FINDINGS:  Uterus Measurements: 6.2 x 3.6 x 4.9 cm = volume: 56 mL. Round heterogenous predominantly hypoechoic mass within the anterior uterine fundus measuring 1.8 x 1.4 x 1.7 cm compatible with fibroid. Endometrium Thickness: 1.3 mm.  No focal abnormality visualized. Right ovary Not visualized Left ovary Not visualized Pulsed Doppler evaluation of both ovaries demonstrates normal low-resistance arterial and venous waveforms. Other findings No abnormal free fluid. IMPRESSION: 1. No acute sonographic abnormality in the pelvis. 2. Fibroid within the anterior uterine fundus with some mild distortion of the endometrial canal. 3. No focal endometrial abnormality or significant endometrial thickening. 4. Nonvisualization of the ovaries. Electronically Signed   By: Placido Sou M.D.   On: 07/10/2022 20:01   CT Renal Stone Study  Result Date: 07/10/2022 CLINICAL DATA:  Lower abdominal pain with hematuria x3 days. EXAM: CT ABDOMEN AND PELVIS WITHOUT CONTRAST TECHNIQUE: Multidetector CT imaging of the abdomen and pelvis was performed following the standard protocol without IV contrast. RADIATION DOSE REDUCTION: This exam was performed according to the departmental dose-optimization program which includes automated exposure control, adjustment of the mA and/or kV according to patient size and/or use of iterative reconstruction technique. COMPARISON:  CT December 10, 2017. FINDINGS: Lower chest: No acute abnormality. Hepatobiliary: Unremarkable noncontrast enhanced appearance of the hepatic parenchyma. Gallbladder surgically absent. No biliary ductal dilation. Pancreas: No pancreatic ductal dilation or evidence of acute inflammation. Spleen: No splenomegaly. Adrenals/Urinary Tract: Bilateral adrenal glands are within normal limits. No hydronephrosis. Nonobstructive punctate left upper/interpolar renal stone. Hypodense 2.1 cm left renal lesion measures fluid density consistent with a cyst and considered benign requiring no  independent imaging follow-up.  Subcentimeter hypodense right renal lesion measures 9 mm on image 30/2, technically too small to accurately characterize but statistically likely to reflect a cyst. No renal, ureteral bladder calculi. Gas in the urinary bladder recommend correlation with history of recent instrumentation. Stomach/Bowel: No radiopaque enteric contrast material was administered. Masslike lobular area along the greater curvature of the gastric fundus best seen on coronal image 80/4 measuring 2.9 x 3.9 x 3.7 cm on images 18/2 and 80/4. No pathologic dilation of small or large bowel. Appendix and terminal ileum appear normal. Colonic diverticulosis without findings of acute diverticulitis. Vascular/Lymphatic: Aneurysmal dilation of the infrarenal abdominal aorta measures 3.4 cm on image 47/2 previously measuring 3.1 cm on CT December 10, 2017. 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 No pathologically enlarged abdominal or pelvic lymph nodes. Reproductive: Uterus and bilateral adnexa are unremarkable. Other: No significant abdominopelvic free fluid. Musculoskeletal: No acute osseous abnormality. Multilevel degenerative changes spine. IMPRESSION: 1. Nonobstructive punctate left upper/interpolar renal stone. No obstructive ureteral or bladder calculi. 2. Masslike 3.9 cm lobular exophytic area along the greater curvature of the gastric fundus is nonspecific possibly reflecting a focal outpouching and fold thickening but is suspicious for a discrete gastric mass such as a gastrointestinal stromal tumor. Suggest gastroenterology consult and further evaluation with nonemergent CT with oral and IV contrast versus upper endoscopy. 3. Colonic diverticulosis without findings of acute diverticulitis. 4. Increased size of the infrarenal abdominal aortic aneurysm now measuring 3.4 cm Recommend follow-up ultrasound every 3 years. This recommendation  follows ACR consensus guidelines: White Paper of the ACR Incidental Findings Committee II on Vascular Findings. J Am Coll Radiol 2013; 10:789-794. 5. 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 6.  Aortic Atherosclerosis (ICD10-I70.0). Electronically Signed   By: Dahlia Bailiff M.D.   On: 07/10/2022 14:14    Assessment/Plan: 66 year old female with history of uterine bleeding found to have a uterine polyp, pulmonary embolism on warfarin who is admitted with persistent gross hematuria.  - No indication for acute urologic surgical intervention at this time. - Please obtain PVR to ensure patient is emptying bladder well. - Continue empiric IV antibiotics.  Follow-up culture and tailor appropriately. - Agree with continuing to hold warfarin at this time as her INR is supratherapeutic and also in the setting of gross hematuria. - Continue to trend hemoglobin. Please alert urology team if she requires blood transfusions secondary to the gross hematuria. - CT urogram pending for further evaluation of etiology of her gross hematuria.  - She will need cystoscopy to evaluate lower tract at some point which can be done outpatient if patient remains stable.   LOS: 0 days   Josph Macho, MD 07/12/2022, 6:35 AM Resident Physician [facility]

## 2022-07-12 NOTE — Consult Note (Signed)
Consultation Note   Referring Provider: [facility] PCP: Billie Ruddy, MD Primary Gastroenterologist: Althia Forts  Reason for consultation: gastric mass  Hospital Day: 3  Assessment / Plan   # 66 yo female admitted with vaginal bleeding / gross hematuria. CT scan suggests hemorrhage/clot in right kidney.. Vaginal bleeding has been intermittent for years, see below. Urology has evaluated inpatient, recommending outpatient workup for hematuria.   # Possible gastric mass, incidental finding on non contrast CT scan. Repeat CT scan with contrast shows only possible mild gastric wall thickening. Mass on non-contrast study wasn't appreciated. She has had some recent nausea without vomiting. Endorses abdominal discomfort at times but very generalized.  EGD when INR comes down  # Chronic [place] anemia. History of IDA due to vaginal bleeding. In 2021 she had hysteroscopy, polypectomy, D&C with benign pathology  # History of DVT / PE, on warfarin. Supratherapeutic INR of 5.2  # Colon cancer screening.  Can discuss outpatient colonoscopy at some point  See PMH for additional medical problems   HPI   Kristi Meyers is a 66 y.o. female with a past medical history significant for HLD, PE, AAA 3.6 cm, HTN, uterine fibroids, GERD.  See PMH for any additional medical problems.  Patient presented to ED yesterday for evaluation of abdominal pain, weakness, vaginal bleeding and hematuria. She is on warfarin. Vaginal bleeding isn't new. She has history of fibroids. The gross hematuria is what triggered ED visit. Today she has started having low right back pain. She endorses intermittent nausea. Over the last week she has had generalized abdominal discomfort unrelated to eating. Bowel movements are at baseline. No black stools or blood in stools. She isn't sure about any recent weight loss but appetite hasn't been good  No Ericson of gastric or colon  cancer    Pertinent workup thus far:  VSS WBC 9.9 Hgb 10.5 ( at baseline), MCV 86.  Normal renal function  Normal LFTs Trop 17 >> 24 Non-contrast CT scan s>>amasslike lobular area along the greater curvature of the gastric fundus measuring 2.9 x 3.9 x 3.7 cm  CT scan with and wo contrast >>There may be mild proximal gastric wall thickening. Previously questioned masslike area along the greater curvature of the stomach is not readily appreciated. Additionally, high attenuation caliceal filling defect in the upper pole right kidney without enhancement, with an additional linear filling defect in the right renal pelvis, findings indicative of hemorrhage/clot. Difficult to definitively exclude malignancy. Associated fullness of the right renal pelvis and right intrarenal collecting system  Urology has evaluated, no indication for acute urologic intervention. Will need outpatient cystoscopy   Previous GI Evaluation    None  Recent Labs and Imaging CT ABDOMEN PELVIS W WO CONTRAST  Result Date: 07/12/2022 CLINICAL DATA:  Hematuria, right-sided abdominal pain radiating to the right leg. EXAM: CT ABDOMEN AND PELVIS WITHOUT AND WITH CONTRAST TECHNIQUE: Multidetector CT imaging of the abdomen and pelvis was performed following the standard protocol before and following the bolus administration of intravenous contrast. RADIATION DOSE REDUCTION: This exam was performed according to the departmental dose-optimization program which includes automated exposure control, adjustment of the mA and/or kV according to patient size and/or use of iterative reconstruction technique. CONTRAST:  167m OMNIPAQUE IOHEXOL 300 MG/ML  SOLN COMPARISON:  07/10/2022. FINDINGS: Lower chest: Bibasilar volume loss, right greater than left. Heart size within normal limits. Atherosclerotic calcification of the aorta and coronary arteries. No pericardial or pleural effusion. Distal esophagus is grossly unremarkable. Hepatobiliary: Liver  is decreased in attenuation diffusely. Cholecystectomy. No biliary ductal dilatation. Pancreas: Negative. Spleen: Negative. Adrenals/Urinary Tract: Right adrenal gland is unremarkable. Nodular thickening of the medial limb left adrenal gland, unchanged. No specific follow-up necessary. Right renal edema with subtle decreased attenuation of the right renal parenchyma on portal venous phase imaging. High attenuation filling defect in the upper pole calices (28/58 does not appear to enhance, measuring at least 1.5 x 1.8 cm (10/11). An additional linear filling defect is seen within the right renal pelvis on delayed imaging (10/15). Associated fullness of the right intrarenal collecting system and right renal pelvis. Poor opacification of the visualized portion of the upper right ureter. 2.2 cm nonenhancing cyst in interpolar left kidney (4/30). No follow-up necessary. Additional subcentimeter low-attenuation lesions in the kidneys are too small to characterize. There may be a punctate stone in the upper pole left kidney. No specific follow-up necessary. Ureters are decompressed. Bladder is grossly unremarkable. Stomach/Bowel: There may be mild proximal gastric wall thickening. Previously questioned masslike area along the greater curvature of the stomach is not readily appreciated. Small bowel, appendix and colon are unremarkable. Vascular/Lymphatic: Abdominal aorta measures 3.3 cm at the level of the superior mesenteric artery and 3.6 cm below the level of the renal arteries. Atherosclerotic calcification of the aorta. Right common iliac artery measures 2.5 cm. No pathologically enlarged lymph nodes. Reproductive: Uterus is visualized. No adnexal mass. 2.2 cm left perineal cyst. Other: No free fluid. Mesenteries and peritoneum are unremarkable. Elevated left hemidiaphragm. Musculoskeletal: Degenerative changes in the spine. No worrisome lytic or sclerotic lesions. IMPRESSION: 1. High attenuation caliceal filling  defect in the upper pole right kidney without enhancement, with an additional linear filling defect in the right renal pelvis, findings indicative of hemorrhage/clot. Difficult to definitively exclude malignancy. Associated fullness of the right renal pelvis and right intrarenal collecting system. 2. Possible punctate stone in the upper pole left kidney. 3. Probable proximal gastric wall thickening. Previously questioned masslike area within the greater curvature of the gastric fundus is not readily appreciated. 4. Hepatic steatosis. 5. 3.6 cm infrarenal aortic aneurysm. Recommend follow-up every 2 years. This recommendation follows ACR consensus guidelines: White Paper of the ACR Incidental Findings Committee II on Vascular Findings. J Am Coll Radiol 2013; 10:789-794. 6. Aortic atherosclerosis (ICD10-I70.0). Coronary artery calcification. Right common iliac artery aneurysm. Electronically Signed   By: MLorin PicketM.D.   On: 07/12/2022 08:38   [place]PELVIC COMPLETE W TRANSVAGINAL AND TORSION R/O  Result Date: 07/10/2022 CLINICAL DATA:  Pelvic pain and vaginal bleeding EXAM: TRANSABDOMINAL AND TRANSVAGINAL ULTRASOUND OF PELVIS DOPPLER ULTRASOUND OF OVARIES TECHNIQUE: Both transabdominal and transvaginal ultrasound examinations of the pelvis were performed. Transabdominal technique was performed for global imaging of the pelvis including uterus, ovaries, adnexal regions, and pelvic cul-de-sac. It was necessary to proceed with endovaginal exam following the transabdominal exam to visualize the uterus, endometrium, ovaries, and adnexa. Color and duplex Doppler ultrasound was utilized to evaluate blood flow to the ovaries. COMPARISON:  Pelvic ultrasound 06/17/2020 and same day CT abdomen and pelvis FINDINGS: Uterus Measurements: 6.2 x 3.6 x 4.9 cm = volume: 56 mL. Round heterogenous predominantly hypoechoic mass within the anterior uterine fundus measuring 1.8 x 1.4 x 1.7 cm compatible with  fibroid. Endometrium  Thickness: 1.3 mm.  No focal abnormality visualized. Right ovary Not visualized Left ovary Not visualized Pulsed Doppler evaluation of both ovaries demonstrates normal low-resistance arterial and venous waveforms. Other findings No abnormal free fluid. IMPRESSION: 1. No acute sonographic abnormality in the pelvis. 2. Fibroid within the anterior uterine fundus with some mild distortion of the endometrial canal. 3. No focal endometrial abnormality or significant endometrial thickening. 4. Nonvisualization of the ovaries. Electronically Signed   By: Placido Sou M.D.   On: 07/10/2022 20:01   CT Renal Stone Study  Result Date: 07/10/2022 CLINICAL DATA:  Lower abdominal pain with hematuria x3 days. EXAM: CT ABDOMEN AND PELVIS WITHOUT CONTRAST TECHNIQUE: Multidetector CT imaging of the abdomen and pelvis was performed following the standard protocol without IV contrast. RADIATION DOSE REDUCTION: This exam was performed according to the departmental dose-optimization program which includes automated exposure control, adjustment of the mA and/or kV according to patient size and/or use of iterative reconstruction technique. COMPARISON:  CT December 10, 2017. FINDINGS: Lower chest: No acute abnormality. Hepatobiliary: Unremarkable noncontrast enhanced appearance of the hepatic parenchyma. Gallbladder surgically absent. No biliary ductal dilation. Pancreas: No pancreatic ductal dilation or evidence of acute inflammation. Spleen: No splenomegaly. Adrenals/Urinary Tract: Bilateral adrenal glands are within normal limits. No hydronephrosis. Nonobstructive punctate left upper/interpolar renal stone. Hypodense 2.1 cm left renal lesion measures fluid density consistent with a cyst and considered benign requiring no independent imaging follow-up. Subcentimeter hypodense right renal lesion measures 9 mm on image 30/2, technically too small to accurately characterize but statistically likely to reflect a cyst. No renal, ureteral  bladder calculi. Gas in the urinary bladder recommend correlation with history of recent instrumentation. Stomach/Bowel: No radiopaque enteric contrast material was administered. Masslike lobular area along the greater curvature of the gastric fundus best seen on coronal image 80/4 measuring 2.9 x 3.9 x 3.7 cm on images 18/2 and 80/4. No pathologic dilation of small or large bowel. Appendix and terminal ileum appear normal. Colonic diverticulosis without findings of acute diverticulitis. Vascular/Lymphatic: Aneurysmal dilation of the infrarenal abdominal aorta measures 3.4 cm on image 47/2 previously measuring 3.1 cm on CT December 10, 2017. 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 No pathologically enlarged abdominal or pelvic lymph nodes. Reproductive: Uterus and bilateral adnexa are unremarkable. Other: No significant abdominopelvic free fluid. Musculoskeletal: No acute osseous abnormality. Multilevel degenerative changes spine. IMPRESSION: 1. Nonobstructive punctate left upper/interpolar renal stone. No obstructive ureteral or bladder calculi. 2. Masslike 3.9 cm lobular exophytic area along the greater curvature of the gastric fundus is nonspecific possibly reflecting a focal outpouching and fold thickening but is suspicious for a discrete gastric mass such as a gastrointestinal stromal tumor. Suggest gastroenterology consult and further evaluation with nonemergent CT with oral and IV contrast versus upper endoscopy. 3. Colonic diverticulosis without findings of acute diverticulitis. 4. Increased size of the infrarenal abdominal aortic aneurysm now measuring 3.4 cm Recommend follow-up ultrasound every 3 years. This recommendation follows ACR consensus guidelines: White Paper of the ACR Incidental Findings Committee II on Vascular Findings. J Am Coll Radiol 2013; 10:789-794. 5. Similar aneurysmal dilation of the right common iliac artery measuring  2.1 cm. 6.  Aortic Atherosclerosis (ICD10-I70.0). Electronically Signed   By: Dahlia Bailiff M.D.   On: 07/10/2022 14:14    Labs:  Recent Labs    07/10/22 1223 07/11/22 0600 07/11/22 0743 07/11/22 1341 07/11/22 2027 07/12/22 0455  WBC 8.9 8.5  --   --   --   --  HGB 11.2* 9.9*   < > 10.2* 10.1* 9.6*  HCT 36.4 31.2*   < > 32.5* 32.2* 30.8*  PLT 329 251  --   --   --   --    < > = values in this interval not displayed.   Recent Labs    07/10/22 1223 07/11/22 0600  NA 142 143  K 4.2 4.1  CL 110 112*  CO2 25 23  GLUCOSE 99 86  BUN 19 21  CREATININE 1.10* 0.95  CALCIUM 9.1 8.8*   Recent Labs    07/11/22 0600  PROT 6.7  ALBUMIN 3.2*  AST 15  ALT 15  ALKPHOS 65  BILITOT 0.9   No results for input(s): "HEPBSAG", "HCVAB", "HEPAIGM", "HEPBIGM" in the last 72 hours. Recent Labs    07/10/22 2316 07/11/22 0622  LABPROT 47.0* 47.3*  INR 5.1* 5.2*    Past Medical History:  Diagnosis Date   Abnormal EKG    hx of left bundle branch block    Anginal pain (HCC)    stable   GERD (gastroesophageal reflux disease)    Heart murmur    soft systolic per dr Terrence Dupont [id number] lov   High cholesterol    Hypertension    Left leg DVT ([place]) 11/2017   pulmonary embolus   Migraine    PMB (postmenopausal bleeding)    Vertigo     Past Surgical History:  Procedure Laterality Date   CHOLECYSTECTOMY  2011   DILATATION & CURETTAGE/HYSTEROSCOPY WITH MYOSURE N/A 09/07/2020   Procedure: DILATATION & CURETTAGE/HYSTEROSCOPY WITH MYOSURE;  Surgeon: Salvadore Dom, MD;  Location: [facility];  Service: Gynecology;  Laterality: N/A;   IR ANGIOGRAM PULMONARY BILATERAL SELECTIVE  12/10/2017   IR ANGIOGRAM SELECTIVE EACH ADDITIONAL VESSEL  12/10/2017   IR ANGIOGRAM SELECTIVE EACH ADDITIONAL VESSEL  12/10/2017   IR INFUSION THROMBOL ARTERIAL INITIAL (MS)  12/10/2017   IR INFUSION THROMBOL ARTERIAL INITIAL (MS)  12/10/2017   IR THROMB F/U EVAL ART/VEN FINAL DAY (MS)   12/11/2017   IR US GUIDE VASC ACCESS RIGHT  12/10/2017    History reviewed. No pertinent family history.  Prior to Admission medications   Medication Sig Start Date End Date Taking? Authorizing Provider  acetaminophen (TYLENOL) 500 MG tablet Take 500 mg by mouth every 6 (six) hours as needed.   Yes [provider]  amLODipine (NORVASC) 5 MG tablet Take 5 mg by mouth daily. 04/15/22  Yes [provider]  atorvastatin (LIPITOR) 40 MG tablet Take 40 mg by mouth at bedtime.    Yes [provider]  carvedilol (COREG) 25 MG tablet Take 25 mg by mouth 2 (two) times daily with a meal.   Yes [provider]  losartan (COZAAR) 100 MG tablet Take 100 mg by mouth daily. 04/30/22  Yes [provider]  meclizine (ANTIVERT) 12.5 MG tablet Take 12.5 mg by mouth 2 (two) times daily.   Yes [provider]  nitroGLYCERIN (NITROSTAT) 0.4 MG SL tablet Place under the tongue. 06/29/22  Yes [provider]  warfarin (COUMADIN) 4 MG tablet Take 4 mg by mouth. 4 mg daily and 2 mg on sunday   Yes [provider]  traMADol (ULTRAM) 50 MG tablet Take 1 tablet (50 mg total) by mouth every 8 (eight) hours as needed for severe pain. Patient not taking: Reported on 09/20/2020 10/29/17   Jaynee Eagles, PA-C  warfarin (COUMADIN) 5 MG tablet Take 5 mg by mouth daily. 05/12/20  [provider]    Current Facility-Administered Medications  Medication Dose Route Frequency Provider Last Rate Last Admin   acetaminophen (TYLENOL) tablet 650 mg  650 mg Oral Q6H PRN Howerter, Justin B, DO   650 mg at 07/12/22 0446   Or   acetaminophen (TYLENOL) suppository 650 mg  650 mg Rectal Q6H PRN Howerter, Justin B, DO       cefTRIAXone (ROCEPHIN) 1 g in sodium chloride 0.9 % 100 mL IVPB  1 g Intravenous Q24H Clance Boll, MD   Stopped at 07/11/22 1118   sodium chloride (PF) 0.9 % injection             Allergies as of 07/10/2022   (No Known Allergies)     Social History   Socioeconomic History   Marital status: Single    Spouse name: Not on file   Number of children: Not on file   Years of education: Not on file   Highest education level: Not on file  Occupational History   Not on file  Tobacco Use   Smoking status: Former    Packs/day: 0.25    Years: 20.00    Total pack years: 5.00    Types: Cigarettes    Quit date: 10/16/2017    Years since quitting: 4.7   Smokeless tobacco: Never  Vaping Use   Vaping Use: Never used  Substance and Sexual Activity   Alcohol use: No   Drug use: Yes    Types: Marijuana    Comment: occ marijuana last used sept 2021   Sexual activity: Not on file  Other Topics Concern   Not on file  Social History Narrative   Not on file   Social Determinants of Health   Financial Resource Strain: Not on file  Food Insecurity: No Food Insecurity (07/11/2022)   Hunger Vital Sign    Worried About Running Out of Food in the Last Year: Never true    Ran Out of Food in the Last Year: Never true  Transportation Needs: No Transportation Needs (07/11/2022)   PRAPARE - Hydrologist (Medical): No    Lack of Transportation (Non-Medical): No  Physical Activity: Not on file  Stress: Not on file  Social Connections: Not on file  Intimate Partner Violence: Not At Risk (07/11/2022)   Humiliation, Afraid, Rape, and Kick questionnaire    Fear of Current or Ex-Partner: No    Emotionally Abused: No    Physically Abused: No    Sexually Abused: No    Review of Systems: All systems reviewed and negative except where noted in HPI.  Physical Exam: Vital signs in last 24 hours: Temp:  [97.4 F (36.3 C)-98.6 F (37 C)] 98.6 F (37 C) (09/27 0854) Pulse Rate:  [62-88] 85 (09/27 0854) Resp:  [15-20] 16 (09/27 0854) BP: (120-171)/(72-111) 171/105 (09/27 0854) SpO2:  [93 %-100 %] 99 % (09/27 0854)    General:  Alert female in NAD Psych:  cooperative. Flat affect Eyes: Pupils equal, no  icterus. Conjunctive pink Ears:  Normal auditory acuity Nose: No deformity, discharge or lesions Neck:  Supple, no masses felt Lungs:  Clear to auscultation.  Heart:  Regular rate, regular rhythm. No lower extremity edema Abdomen:  Soft, nondistended, nontender, active bowel sounds, no masses felt Rectal :  Deferred Msk: Symmetrical without gross deformities.  Neurologic:  Alert, oriented, grossly normal neurologically Skin:  Intact without significant lesions.    Intake/Output from previous day: 09/26 0701 -  09/27 0700 In: 1766.3 [I.V.:666.3; IV Piggyback:1100] Out: 350 [Urine:350] Intake/Output this shift:  No intake/output data recorded.    Principal Problem:   Gross hematuria    Tye Savoy, NP-C @  07/12/2022, 9:44 AM

## 2022-07-12 NOTE — Progress Notes (Signed)
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  She also noticed blood in her urine for 3 days.   In the emergency room afebrile.  96% on room air.  Hemoglobin 11.2, repeat 9.9.  INR 5.1.  CT scan with suspected 3.9 cm legs of attic area at the gastric fundus.  Seen by urology.  Assessment & Plan:   Gross hematuria in a patient with supratherapeutic INR: Abdominal and flank pain. -Hemoglobin is stable, continue to monitor.  Transfuse for less than 8. -Followed by urology, anticipating conservative management. -CT urogram with bleeding/hematoma right pelvicalyceal system likely source of bleeding. -Managing, holding warfarin.  Vaginal bleeding: With history of fibroids and endometrial polyp status post polypectomy.  More likely bleeding from urethra.  Abnormal thickening of gastric fundus: Endoscopy when INR corrects.  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  3.8 today.  Continue monitoring.  Will start on heparin once it is less than 2 and monitor. Last thromboembolism event in 2019, can hold anticoagulation.  Essential hypertension: Holding home hypertensive medications.   DVT prophylaxis: Place and maintain sequential compression device Start: 07/11/22 0741 SCDs Start: 07/11/22 0416   Code Status: Full code Family Communication: None Disposition Plan: Status is: Observation The patient will require care spanning > 2 midnights and should be moved to inpatient because: Significant hematuria, inpatient treatment and monitoring needed     Consultants:   Urology Gastroenterology  Procedures:  None  Antimicrobials:  Rocephin 9/26   Subjective: Examined patient in the morning rounds after she arrived to the medical floor.  Patient was complaining of ongoing right flank pain and nausea.  Nursing reported gross hematuria/pelvic area bleeding on transfer.  Objective: Vitals:   07/12/22 0541 07/12/22 0717 07/12/22 0854 07/12/22 1320  BP: (!) 154/108 (!) 159/111 (!) 171/105 (!) 160/102  Pulse: 78 85 85 86  Resp: '18 16 16 16  '$ Temp: (!) 97.5 F (36.4 C) (!) 97.4 F (36.3 C) 98.6 F (37 C) 98.2 F (36.8 C)  TempSrc: Oral Oral Oral Oral  SpO2: 95% 97% 99% 98%  Weight:      Height:        Intake/Output Summary (Last 24 hours) at 07/12/2022 1533 Last data filed at 07/12/2022 1300 Gross per 24 hour  Intake --  Output 650 ml  Net -650 ml   Filed Weights   07/10/22 1217  Weight: 85.7 kg    Examination:  General exam: Appears anxious.  Mild distress due to pain.  On room air. Respiratory system: Clear to auscultation. Respiratory effort normal. Cardiovascular system: S1 & S2 heard, RRR. No JVD, murmurs, rubs, gallops or clicks. No pedal edema. Gastrointestinal system: Abdomen is nondistended, soft and nontender. No organomegaly or masses felt. Normal bowel sounds heard. Central nervous system: Alert and oriented. No focal neurological deficits. Extremities: Symmetric 5 x 5 power.   Data Reviewed: I have personally reviewed following labs and imaging studies  CBC: Recent Labs  Lab 07/10/22 1223 07/11/22 0600 07/11/22 0743 07/11/22 0950 07/11/22 1341 07/11/22 2027 07/12/22 0455 07/12/22 0937  WBC 8.9 8.5  --   --   --   --   --  9.9  NEUTROABS 4.7 4.8  --   --   --   --   --   --   HGB 11.2* 9.9*   < > 9.9* 10.2* 10.1* 9.6* 10.5*  HCT 36.4 31.2*   < > 31.6* 32.5* 32.2* 30.8* 33.1*  MCV 87.1 86.0  --   --   --   --   --  86.6  PLT 329 251  --   --   --   --   --  278   < > = values in this interval not displayed.    Basic Metabolic Panel: Recent Labs  Lab 07/10/22 1223 07/11/22 0600  NA 142 143  K 4.2 4.1  CL 110 112*  CO2 25 23  GLUCOSE 99 86  BUN 19 21  CREATININE 1.10* 0.95  CALCIUM 9.1 8.8*  MG  --  1.8   GFR: Estimated Creatinine Clearance: 60 mL/min (by C-G formula based on SCr of 0.95 mg/dL). Liver Function Tests: Recent Labs  Lab 07/10/22 1223 07/11/22 0600  AST 14* 15  ALT 14 15  ALKPHOS 67 65  BILITOT 0.8 0.9  PROT 7.4 6.7  ALBUMIN 3.5 3.2*   Recent Labs  Lab 07/10/22 1223  LIPASE 26   No results for input(s): "AMMONIA" in the last 168 hours. Coagulation Profile: Recent Labs  Lab 07/10/22 2316 07/11/22 0622 07/12/22 0937  INR 5.1* 5.2* 3.8*   Cardiac Enzymes: No results for input(s): "CKTOTAL", "CKMB", "CKMBINDEX", "TROPONINI" in the last 168 hours. BNP (last 3 results) No results for input(s): "PROBNP" in the last 8760 hours. HbA1C: No results for input(s): "HGBA1C" in the last 72 hours. CBG: No results for input(s): "GLUCAP" in the last 168 hours. Lipid Profile: No results for input(s): "CHOL", "HDL", "LDLCALC", "TRIG", "CHOLHDL", "LDLDIRECT" in the last 72 hours. Thyroid Function Tests: No results for input(s): "TSH", "T4TOTAL", "FREET4", "T3FREE", "THYROIDAB" in the last 72 hours. Anemia Panel: No results for input(s): "VITAMINB12", "FOLATE", "FERRITIN", "TIBC", "IRON", "RETICCTPCT" in the last 72 hours. Sepsis Labs: No results for input(s): "PROCALCITON", "LATICACIDVEN" in the last 168 hours.  Recent Results (from the past 240 hour(s))  Urine Culture     Status: Abnormal (Preliminary result)   Collection Time: 07/11/22  1:00 AM   Specimen: In/Out Cath Urine  Result Value Ref Range Status   Specimen Description   Final    IN/OUT CATH URINE Performed at [facility], [place] [address] [place], [place] [address]    Special Requests   Final    NONE Performed at [facility], [place] [address], [place],  [place] [address]    Culture (A)  Final    >=100,000 COLONIES/mL ESCHERICHIA COLI SUSCEPTIBILITIES TO FOLLOW Performed at [facility], [place] [address], [place], [place] [address]    Report Status PENDING  Incomplete         Radiology Studies: CT ABDOMEN PELVIS W WO CONTRAST  Result Date: 07/12/2022 CLINICAL DATA:  Hematuria, right-sided abdominal pain radiating to the right leg. EXAM: CT ABDOMEN AND PELVIS WITHOUT AND WITH CONTRAST TECHNIQUE: Multidetector CT imaging of the abdomen and pelvis was performed following the standard protocol before and following the bolus administration of intravenous contrast. RADIATION DOSE REDUCTION: This exam was performed according to the departmental dose-optimization program which includes automated exposure control, adjustment of the mA and/or kV according to patient size and/or use of iterative reconstruction technique. CONTRAST:  162m OMNIPAQUE IOHEXOL 300 MG/ML  SOLN COMPARISON:  07/10/2022. FINDINGS: Lower chest: Bibasilar volume loss, right greater than left. Heart size within normal limits. Atherosclerotic calcification of the aorta and coronary arteries. No pericardial or pleural effusion. Distal esophagus is grossly unremarkable. Hepatobiliary: Liver is decreased in attenuation diffusely. Cholecystectomy. No biliary ductal dilatation. Pancreas: Negative. Spleen: Negative. Adrenals/Urinary Tract: Right adrenal gland is unremarkable. Nodular thickening of the medial limb left adrenal gland, unchanged. No specific follow-up necessary. Right renal edema with subtle decreased attenuation of the right renal parenchyma on portal venous phase imaging. High attenuation filling defect in the upper pole calices (8/09) does not appear to enhance, measuring at least 1.5 x 1.8 cm (10/11). An additional linear filling defect is seen within the right renal pelvis on delayed imaging (10/15). Associated fullness of the right intrarenal collecting system and right renal  pelvis. Poor opacification of the visualized portion of the upper right ureter. 2.2 cm nonenhancing cyst in interpolar left kidney (4/30). No follow-up necessary. Additional subcentimeter low-attenuation lesions in the kidneys are too small to characterize. There may be a punctate stone in the upper pole left kidney. No specific follow-up necessary. Ureters are decompressed. Bladder is grossly unremarkable. Stomach/Bowel: There may be mild proximal gastric wall thickening. Previously questioned masslike area along the greater curvature of the stomach is not readily appreciated. Small bowel, appendix and colon are unremarkable. Vascular/Lymphatic: Abdominal aorta measures 3.3 cm at the level of the superior mesenteric artery and 3.6 cm below the level of the renal arteries. Atherosclerotic calcification of the aorta. Right common iliac artery measures 2.5 cm. No pathologically enlarged lymph nodes. Reproductive: Uterus is visualized. No adnexal mass. 2.2 cm left perineal cyst. Other: No free fluid. Mesenteries and peritoneum are unremarkable. Elevated left hemidiaphragm. Musculoskeletal: Degenerative changes in the spine. No worrisome lytic or sclerotic lesions. IMPRESSION: 1. High attenuation caliceal filling defect in the upper pole right kidney without enhancement, with an additional linear filling defect in the right renal pelvis, findings indicative of hemorrhage/clot. Difficult to definitively exclude malignancy. Associated fullness of the right renal pelvis and right intrarenal collecting system. 2. Possible punctate stone in the upper pole left kidney. 3. Probable proximal gastric wall thickening. Previously questioned masslike area within the greater curvature of the gastric fundus is not readily appreciated. 4. Hepatic steatosis. 5. 3.6 cm infrarenal aortic aneurysm. Recommend follow-up every 2 years. This recommendation follows ACR consensus guidelines: White Paper of the ACR Incidental Findings Committee  II on Vascular Findings. J Am Coll Radiol 2013; 10:789-794. 6. Aortic atherosclerosis (ICD10-I70.0). Coronary artery calcification. Right common iliac artery aneurysm. Electronically Signed   By: Lorin Picket M.D.   On: 07/12/2022 08:38   US PELVIC COMPLETE W TRANSVAGINAL AND TORSION R/O  Result Date: 07/10/2022 CLINICAL DATA:  Pelvic pain and vaginal bleeding EXAM: TRANSABDOMINAL AND TRANSVAGINAL ULTRASOUND OF PELVIS DOPPLER ULTRASOUND OF OVARIES TECHNIQUE: Both transabdominal and transvaginal ultrasound examinations of the pelvis were performed. Transabdominal technique was performed for global imaging of the pelvis including uterus, ovaries, adnexal regions, and pelvic cul-de-sac. It was necessary to proceed with endovaginal exam following the transabdominal exam to visualize the uterus, endometrium, ovaries, and adnexa. Color and duplex Doppler ultrasound was utilized to evaluate blood flow to the ovaries. COMPARISON:  Pelvic ultrasound 06/17/2020 and same day CT abdomen and pelvis FINDINGS: Uterus Measurements: 6.2 x 3.6 x 4.9 cm = volume: 56 mL. Round heterogenous predominantly hypoechoic mass within the anterior uterine fundus measuring 1.8 x 1.4 x 1.7 cm compatible with fibroid. Endometrium Thickness: 1.3 mm.  No focal abnormality  visualized. Right ovary Not visualized Left ovary Not visualized Pulsed Doppler evaluation of both ovaries demonstrates normal low-resistance arterial and venous waveforms. Other findings No abnormal free fluid. IMPRESSION: 1. No acute sonographic abnormality in the pelvis. 2. Fibroid within the anterior uterine fundus with some mild distortion of the endometrial canal. 3. No focal endometrial abnormality or significant endometrial thickening. 4. Nonvisualization of the ovaries. Electronically Signed   By: Placido Sou M.D.   On: 07/10/2022 20:01        Scheduled Meds: Continuous Infusions:  cefTRIAXone (ROCEPHIN)  IV Stopped (07/11/22 1118)     LOS: 0 days     Time spent: 35 minutes    Barb Merino, MD [facility] Pager [phone]

## 2022-07-12 NOTE — ED Notes (Signed)
Patient given po tylenol for pain in the right side.  She reports the pain is new and radiates down into her right leg.  Pedal pulse remains strong.  Sensory motor intact.  She continues to have blood in her urine, urine is mostly blood in the suction container.  Will inform MD of the new pain in the right leg.

## 2022-07-13 ENCOUNTER — Encounter (HOSPITAL_COMMUNITY)
Admission: EM | Disposition: A | Discharge status: Home Health | Payer: Self-pay | Source: Home / Self Care | Attending: Internal Medicine

## 2022-07-13 DIAGNOSIS — R262 Difficulty in walking, not elsewhere classified: Secondary | ICD-10-CM | POA: Diagnosis present

## 2022-07-13 DIAGNOSIS — I10 Essential (primary) hypertension: Secondary | ICD-10-CM | POA: Diagnosis present

## 2022-07-13 DIAGNOSIS — K2950 Unspecified chronic gastritis without bleeding: Secondary | ICD-10-CM | POA: Diagnosis not present

## 2022-07-13 DIAGNOSIS — R933 Abnormal findings on diagnostic imaging of other parts of digestive tract: Secondary | ICD-10-CM | POA: Diagnosis not present

## 2022-07-13 DIAGNOSIS — Z79899 Other long term (current) drug therapy: Secondary | ICD-10-CM | POA: Diagnosis not present

## 2022-07-13 DIAGNOSIS — N390 Urinary tract infection, site not specified: Secondary | ICD-10-CM | POA: Diagnosis present

## 2022-07-13 DIAGNOSIS — K2289 Other specified disease of esophagus: Secondary | ICD-10-CM | POA: Diagnosis not present

## 2022-07-13 DIAGNOSIS — D62 Acute posthemorrhagic anemia: Secondary | ICD-10-CM | POA: Diagnosis not present

## 2022-07-13 DIAGNOSIS — N179 Acute kidney failure, unspecified: Secondary | ICD-10-CM | POA: Diagnosis not present

## 2022-07-13 DIAGNOSIS — Z87891 Personal history of nicotine dependence: Secondary | ICD-10-CM | POA: Diagnosis not present

## 2022-07-13 DIAGNOSIS — D6832 Hemorrhagic disorder due to extrinsic circulating anticoagulants: Secondary | ICD-10-CM | POA: Diagnosis present

## 2022-07-13 DIAGNOSIS — B9620 Unspecified Escherichia coli [E. coli] as the cause of diseases classified elsewhere: Secondary | ICD-10-CM | POA: Diagnosis present

## 2022-07-13 DIAGNOSIS — Z95828 Presence of other vascular implants and grafts: Secondary | ICD-10-CM | POA: Diagnosis not present

## 2022-07-13 DIAGNOSIS — D259 Leiomyoma of uterus, unspecified: Secondary | ICD-10-CM | POA: Diagnosis present

## 2022-07-13 DIAGNOSIS — R310 Gross hematuria: Secondary | ICD-10-CM | POA: Diagnosis present

## 2022-07-13 DIAGNOSIS — T45515A Adverse effect of anticoagulants, initial encounter: Secondary | ICD-10-CM | POA: Diagnosis present

## 2022-07-13 DIAGNOSIS — Z9049 Acquired absence of other specified parts of digestive tract: Secondary | ICD-10-CM | POA: Diagnosis not present

## 2022-07-13 DIAGNOSIS — R1013 Epigastric pain: Secondary | ICD-10-CM

## 2022-07-13 DIAGNOSIS — K219 Gastro-esophageal reflux disease without esophagitis: Secondary | ICD-10-CM | POA: Diagnosis present

## 2022-07-13 DIAGNOSIS — E7800 Pure hypercholesterolemia, unspecified: Secondary | ICD-10-CM | POA: Diagnosis present

## 2022-07-13 DIAGNOSIS — R1030 Lower abdominal pain, unspecified: Secondary | ICD-10-CM | POA: Diagnosis present

## 2022-07-13 DIAGNOSIS — I447 Left bundle-branch block, unspecified: Secondary | ICD-10-CM | POA: Diagnosis present

## 2022-07-13 DIAGNOSIS — Z8249 Family history of ischemic heart disease and other diseases of the circulatory system: Secondary | ICD-10-CM | POA: Diagnosis not present

## 2022-07-13 DIAGNOSIS — Z7901 Long term (current) use of anticoagulants: Secondary | ICD-10-CM | POA: Diagnosis not present

## 2022-07-13 DIAGNOSIS — D649 Anemia, unspecified: Secondary | ICD-10-CM | POA: Diagnosis not present

## 2022-07-13 DIAGNOSIS — E669 Obesity, unspecified: Secondary | ICD-10-CM | POA: Diagnosis present

## 2022-07-13 DIAGNOSIS — Z86711 Personal history of pulmonary embolism: Secondary | ICD-10-CM | POA: Diagnosis not present

## 2022-07-13 DIAGNOSIS — E876 Hypokalemia: Secondary | ICD-10-CM | POA: Diagnosis present

## 2022-07-13 DIAGNOSIS — Z86718 Personal history of other venous thrombosis and embolism: Secondary | ICD-10-CM | POA: Diagnosis not present

## 2022-07-13 DIAGNOSIS — K2970 Gastritis, unspecified, without bleeding: Secondary | ICD-10-CM | POA: Diagnosis not present

## 2022-07-13 DIAGNOSIS — Q399 Congenital malformation of esophagus, unspecified: Secondary | ICD-10-CM | POA: Diagnosis not present

## 2022-07-13 DIAGNOSIS — E785 Hyperlipidemia, unspecified: Secondary | ICD-10-CM | POA: Diagnosis not present

## 2022-07-13 DIAGNOSIS — K3189 Other diseases of stomach and duodenum: Secondary | ICD-10-CM | POA: Diagnosis not present

## 2022-07-13 LAB — HEMOGLOBIN AND HEMATOCRIT, BLOOD
HCT: 36.5 % (ref 36.0–46.0)
HCT: 31.6 % — ABNORMAL LOW (ref 36.0–46.0)
Hemoglobin: 11.3 g/dL — ABNORMAL LOW (ref 12.0–15.0)
Hemoglobin: 9.9 g/dL — ABNORMAL LOW (ref 12.0–15.0)

## 2022-07-13 LAB — URINE CULTURE: Culture: >=100000 — AB

## 2022-07-13 LAB — PROTIME-INR
INR: 3.1 — ABNORMAL HIGH (ref 0.8–1.2)
Prothrombin Time: 31.9 seconds — ABNORMAL HIGH (ref 11.4–15.2)

## 2022-07-13 SURGERY — UPPER ENDOSCOPIC ULTRASOUND (EUS) LINEAR
Anesthesia: Monitor Anesthesia Care

## 2022-07-13 MED ORDER — LIP MEDEX EX OINT
TOPICAL_OINTMENT | CUTANEOUS | Status: DC | PRN
  Filled 2022-07-13: qty 7

## 2022-07-13 MED ORDER — CEFADROXIL 500 MG PO CAPS
500.0000 mg | ORAL_CAPSULE | Freq: Two times a day (BID) | ORAL | Status: DC
  Administered 2022-07-14 – 2022-07-18 (×9): 500 mg via ORAL
  Filled 2022-07-13 (×9): qty 1

## 2022-07-13 NOTE — Progress Notes (Signed)
Daily Progress Note  Hospital Day: 4  Chief Complaint: gastric mass on imaging  Brief History Kristi Meyers is a 66 y.o. female with a past medical history significant for HLD, PE, AAA 3.6 cm, HTN, uterine fibroids, GERD, cholecystectomy.  Seen in consultation by [place] on 9/27 for gastric mass on imaging.    Assessment / Plan   #66 yo female admitted with vaginal bleeding / gross hematuria in setting of supratherapeutic INR. CT scan suggests hemorrhage/clot in right kidney. Vaginal bleeding has been intermittent for years, see below. Urology has evaluated hematuria (inpatient), recommending outpatient workup for hematuria.    # Possible gastric mass, incidental finding on non contrast CT scan. Repeat CT scan with contrast shows only possible mild gastric wall thickening. Mass on non-contrast study wasn't appreciated. She has had some recent nausea without vomiting. Endorses abdominal discomfort at times but very generalized.  Labs reviewed. Hgb stable at 11.3. INR 3.1.  Repeat INR in am. EGD, +/- EUS when INR <2 down   # Chronic [place] anemia. History of IDA due to vaginal bleeding. In 2021 she had hysteroscopy, polypectomy, D&C with benign pathology. Hgb stable.    # History of DVT / PE, on warfarin. Supratherapeutic INR drifting down.   # Colon cancer screening.  Can discuss outpatient colonoscopy at some point.    Subjective   Generalized lower abdominal discomfort. Denies constipation. No upper abdominal pain.   Objective  Imaging:  CT ABDOMEN PELVIS W WO CONTRAST  Result Date: 07/12/2022 CLINICAL DATA:  Hematuria, right-sided abdominal pain radiating to the right leg. EXAM: CT ABDOMEN AND PELVIS WITHOUT AND WITH CONTRAST TECHNIQUE: Multidetector CT imaging of the abdomen and pelvis was performed following the standard protocol before and following the bolus administration of intravenous contrast. RADIATION DOSE REDUCTION: This exam was performed according to the departmental  dose-optimization program which includes automated exposure control, adjustment of the mA and/or kV according to patient size and/or use of iterative reconstruction technique. CONTRAST:  143m OMNIPAQUE IOHEXOL 300 MG/ML  SOLN COMPARISON:  07/10/2022. FINDINGS: Lower chest: Bibasilar volume loss, right greater than left. Heart size within normal limits. Atherosclerotic calcification of the aorta and coronary arteries. No pericardial or pleural effusion. Distal esophagus is grossly unremarkable. Hepatobiliary: Liver is decreased in attenuation diffusely. Cholecystectomy. No biliary ductal dilatation. Pancreas: Negative. Spleen: Negative. Adrenals/Urinary Tract: Right adrenal gland is unremarkable. Nodular thickening of the medial limb left adrenal gland, unchanged. No specific follow-up necessary. Right renal edema with subtle decreased attenuation of the right renal parenchyma on portal venous phase imaging. High attenuation filling defect in the upper pole calices (25/17 does not appear to enhance, measuring at least 1.5 x 1.8 cm (10/11). An additional linear filling defect is seen within the right renal pelvis on delayed imaging (10/15). Associated fullness of the right intrarenal collecting system and right renal pelvis. Poor opacification of the visualized portion of the upper right ureter. 2.2 cm nonenhancing cyst in interpolar left kidney (4/30). No follow-up necessary. Additional subcentimeter low-attenuation lesions in the kidneys are too small to characterize. There may be a punctate stone in the upper pole left kidney. No specific follow-up necessary. Ureters are decompressed. Bladder is grossly unremarkable. Stomach/Bowel: There may be mild proximal gastric wall thickening. Previously questioned masslike area along the greater curvature of the stomach is not readily appreciated. Small bowel, appendix and colon are unremarkable. Vascular/Lymphatic: Abdominal aorta measures 3.3 cm at the level of the  superior mesenteric artery and 3.6 cm below  the level of the renal arteries. Atherosclerotic calcification of the aorta. Right common iliac artery measures 2.5 cm. No pathologically enlarged lymph nodes. Reproductive: Uterus is visualized. No adnexal mass. 2.2 cm left perineal cyst. Other: No free fluid. Mesenteries and peritoneum are unremarkable. Elevated left hemidiaphragm. Musculoskeletal: Degenerative changes in the spine. No worrisome lytic or sclerotic lesions. IMPRESSION: 1. High attenuation caliceal filling defect in the upper pole right kidney without enhancement, with an additional linear filling defect in the right renal pelvis, findings indicative of hemorrhage/clot. Difficult to definitively exclude malignancy. Associated fullness of the right renal pelvis and right intrarenal collecting system. 2. Possible punctate stone in the upper pole left kidney. 3. Probable proximal gastric wall thickening. Previously questioned masslike area within the greater curvature of the gastric fundus is not readily appreciated. 4. Hepatic steatosis. 5. 3.6 cm infrarenal aortic aneurysm. Recommend follow-up every 2 years. This recommendation follows ACR consensus guidelines: White Paper of the ACR Incidental Findings Committee II on Vascular Findings. J Am Coll Radiol 2013; 10:789-794. 6. Aortic atherosclerosis (ICD10-I70.0). Coronary artery calcification. Right common iliac artery aneurysm. Electronically Signed   By: Lorin Picket M.D.   On: 07/12/2022 08:38   US PELVIC COMPLETE W TRANSVAGINAL AND TORSION R/O  Result Date: 07/10/2022 CLINICAL DATA:  Pelvic pain and vaginal bleeding EXAM: TRANSABDOMINAL AND TRANSVAGINAL ULTRASOUND OF PELVIS DOPPLER ULTRASOUND OF OVARIES TECHNIQUE: Both transabdominal and transvaginal ultrasound examinations of the pelvis were performed. Transabdominal technique was performed for global imaging of the pelvis including uterus, ovaries, adnexal regions, and pelvic cul-de-sac. It was  necessary to proceed with endovaginal exam following the transabdominal exam to visualize the uterus, endometrium, ovaries, and adnexa. Color and duplex Doppler ultrasound was utilized to evaluate blood flow to the ovaries. COMPARISON:  Pelvic ultrasound 06/17/2020 and same day CT abdomen and pelvis FINDINGS: Uterus Measurements: 6.2 x 3.6 x 4.9 cm = volume: 56 mL. Round heterogenous predominantly hypoechoic mass within the anterior uterine fundus measuring 1.8 x 1.4 x 1.7 cm compatible with fibroid. Endometrium Thickness: 1.3 mm.  No focal abnormality visualized. Right ovary Not visualized Left ovary Not visualized Pulsed Doppler evaluation of both ovaries demonstrates normal low-resistance arterial and venous waveforms. Other findings No abnormal free fluid. IMPRESSION: 1. No acute sonographic abnormality in the pelvis. 2. Fibroid within the anterior uterine fundus with some mild distortion of the endometrial canal. 3. No focal endometrial abnormality or significant endometrial thickening. 4. Nonvisualization of the ovaries. Electronically Signed   By: Placido Sou M.D.   On: 07/10/2022 20:01   CT Renal Stone Study  Result Date: 07/10/2022 CLINICAL DATA:  Lower abdominal pain with hematuria x3 days. EXAM: CT ABDOMEN AND PELVIS WITHOUT CONTRAST TECHNIQUE: Multidetector CT imaging of the abdomen and pelvis was performed following the standard protocol without IV contrast. RADIATION DOSE REDUCTION: This exam was performed according to the departmental dose-optimization program which includes automated exposure control, adjustment of the mA and/or kV according to patient size and/or use of iterative reconstruction technique. COMPARISON:  CT December 10, 2017. FINDINGS: Lower chest: No acute abnormality. Hepatobiliary: Unremarkable noncontrast enhanced appearance of the hepatic parenchyma. Gallbladder surgically absent. No biliary ductal dilation. Pancreas: No pancreatic ductal dilation or evidence of acute  inflammation. Spleen: No splenomegaly. Adrenals/Urinary Tract: Bilateral adrenal glands are within normal limits. No hydronephrosis. Nonobstructive punctate left upper/interpolar renal stone. Hypodense 2.1 cm left renal lesion measures fluid density consistent with a cyst and considered benign requiring no independent imaging follow-up. Subcentimeter hypodense right renal lesion measures 9 mm  on image 30/2, technically too small to accurately characterize but statistically likely to reflect a cyst. No renal, ureteral bladder calculi. Gas in the urinary bladder recommend correlation with history of recent instrumentation. Stomach/Bowel: No radiopaque enteric contrast material was administered. Masslike lobular area along the greater curvature of the gastric fundus best seen on coronal image 80/4 measuring 2.9 x 3.9 x 3.7 cm on images 18/2 and 80/4. No pathologic dilation of small or large bowel. Appendix and terminal ileum appear normal. Colonic diverticulosis without findings of acute diverticulitis. Vascular/Lymphatic: Aneurysmal dilation of the infrarenal abdominal aorta measures 3.4 cm on image 47/2 previously measuring 3.1 cm on CT December 10, 2017. 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 No pathologically enlarged abdominal or pelvic lymph nodes. Reproductive: Uterus and bilateral adnexa are unremarkable. Other: No significant abdominopelvic free fluid. Musculoskeletal: No acute osseous abnormality. Multilevel degenerative changes spine. IMPRESSION: 1. Nonobstructive punctate left upper/interpolar renal stone. No obstructive ureteral or bladder calculi. 2. Masslike 3.9 cm lobular exophytic area along the greater curvature of the gastric fundus is nonspecific possibly reflecting a focal outpouching and fold thickening but is suspicious for a discrete gastric mass such as a gastrointestinal stromal tumor. Suggest gastroenterology consult and  further evaluation with nonemergent CT with oral and IV contrast versus upper endoscopy. 3. Colonic diverticulosis without findings of acute diverticulitis. 4. Increased size of the infrarenal abdominal aortic aneurysm now measuring 3.4 cm Recommend follow-up ultrasound every 3 years. This recommendation follows ACR consensus guidelines: White Paper of the ACR Incidental Findings Committee II on Vascular Findings. J Am Coll Radiol 2013; 10:789-794. 5. 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 6.  Aortic Atherosclerosis (ICD10-I70.0). Electronically Signed   By: Dahlia Bailiff M.D.   On: 07/10/2022 14:14    Lab Results: Recent Labs    07/10/22 1223 07/11/22 0600 07/11/22 0743 07/12/22 0937 07/12/22 1653 07/13/22 0424  WBC 8.9 8.5  --  9.9  --   --   HGB 11.2* 9.9*   < > 10.5* 11.1* 11.3*  HCT 36.4 31.2*   < > 33.1* 35.8* 36.5  PLT 329 251  --  278  --   --    < > = values in this interval not displayed.   BMET Recent Labs    07/10/22 1223 07/11/22 0600  NA 142 143  K 4.2 4.1  CL 110 112*  CO2 25 23  GLUCOSE 99 86  BUN 19 21  CREATININE 1.10* 0.95  CALCIUM 9.1 8.8*   LFT Recent Labs    07/11/22 0600  PROT 6.7  ALBUMIN 3.2*  AST 15  ALT 15  ALKPHOS 65  BILITOT 0.9   PT/INR Recent Labs    07/12/22 0937 07/13/22 0424  LABPROT 36.8* 31.9*  INR 3.8* 3.1*     Scheduled inpatient medications:   Continuous inpatient infusions:   cefTRIAXone (ROCEPHIN)  IV 1 g (07/13/22 0851)   PRN inpatient medications: acetaminophen **OR** acetaminophen, morphine injection, oxyCODONE  Vital signs in last 24 hours: Temp:  [98.1 F (36.7 C)-98.6 F (37 C)] 98.4 F (36.9 C) (09/28 0830) Pulse Rate:  [86-97] 87 (09/28 0830) Resp:  [16-20] 18 (09/28 0830) BP: (156-162)/(98-110) 159/103 (09/28 0830) SpO2:  [97 %-98 %] 97 % (09/28 0830) Last BM Date : 07/10/22  Intake/Output Summary (Last 24 hours) at 07/13/2022 1006 Last data filed at  07/13/2022 0631 Gross per 24 hour  Intake 120  ml  Output 800 ml  Net -680 ml    Intake/Output from previous day: 09/27 0701 - 09/28 0700 In: 120 [P.O.:120] Out: 800 [Urine:800] Intake/Output this shift: No intake/output data recorded.   Physical Exam:  General: Alert female in NAD Heart:  Regular rate and rhythm. Pulmonary: Normal respiratory effort Abdomen: Soft, nondistended, active bowel sounds.   Neurologic: Alert and oriented Psych: Pleasant. Cooperative.    Principal Problem:   Gross hematuria    LOS: 0 days   Tye Savoy ,NP 07/13/2022, 10:06 AM

## 2022-07-13 NOTE — Consult Note (Signed)
Subjective: Pt afebrile and stable.  Continues to void spontaneously. Urine thin and color improving. CTU yesterday with clot within right renal pelvis without obstruction or concerning lesions. Hgb 11.3 today with INR 3.1. Urine culture growing >100K CFU GNR. Continued on Rocephin.  Objective: Vital signs in last 24 hours: Temp:  [97.4 F (36.3 C)-98.6 F (37 C)] 98.6 F (37 C) (09/28 0612) Pulse Rate:  [85-97] 97 (09/28 0612) Resp:  [16-20] 20 (09/28 0612) BP: (156-171)/(98-111) 156/110 (09/28 0612) SpO2:  [97 %-99 %] 97 % (09/28 0612)  Intake/Output from previous day: 09/27 0701 - 09/28 0700 In: 120 [P.O.:120] Out: 800 [Urine:800] Intake/Output this shift: Total I/O In: -  Out: 500 [Urine:500]  Physical Exam:  General: Alert and oriented CV: Regular rate Lungs: Normal work of breathing on room air GU: External urinary device in place with urine in canister and tubing thin watermelon colored Ext: NT, No erythema  Lab Results: Recent Labs    07/12/22 0937 07/12/22 1653 07/13/22 0424  HGB 10.5* 11.1* 11.3*  HCT 33.1* 35.8* 36.5    BMET Recent Labs    07/10/22 1223 07/11/22 0600  NA 142 143  K 4.2 4.1  CL 110 112*  CO2 25 23  GLUCOSE 99 86  BUN 19 21  CREATININE 1.10* 0.95  CALCIUM 9.1 8.8*      Studies/Results: CT ABDOMEN PELVIS W WO CONTRAST  Result Date: 07/12/2022 CLINICAL DATA:  Hematuria, right-sided abdominal pain radiating to the right leg. EXAM: CT ABDOMEN AND PELVIS WITHOUT AND WITH CONTRAST TECHNIQUE: Multidetector CT imaging of the abdomen and pelvis was performed following the standard protocol before and following the bolus administration of intravenous contrast. RADIATION DOSE REDUCTION: This exam was performed according to the departmental dose-optimization program which includes automated exposure control, adjustment of the mA and/or kV according to patient size and/or use of iterative reconstruction technique. CONTRAST:  12m  OMNIPAQUE IOHEXOL 300 MG/ML  SOLN COMPARISON:  07/10/2022. FINDINGS: Lower chest: Bibasilar volume loss, right greater than left. Heart size within normal limits. Atherosclerotic calcification of the aorta and coronary arteries. No pericardial or pleural effusion. Distal esophagus is grossly unremarkable. Hepatobiliary: Liver is decreased in attenuation diffusely. Cholecystectomy. No biliary ductal dilatation. Pancreas: Negative. Spleen: Negative. Adrenals/Urinary Tract: Right adrenal gland is unremarkable. Nodular thickening of the medial limb left adrenal gland, unchanged. No specific follow-up necessary. Right renal edema with subtle decreased attenuation of the right renal parenchyma on portal venous phase imaging. High attenuation filling defect in the upper pole calices (22/67 does not appear to enhance, measuring at least 1.5 x 1.8 cm (10/11). An additional linear filling defect is seen within the right renal pelvis on delayed imaging (10/15). Associated fullness of the right intrarenal collecting system and right renal pelvis. Poor opacification of the visualized portion of the upper right ureter. 2.2 cm nonenhancing cyst in interpolar left kidney (4/30). No follow-up necessary. Additional subcentimeter low-attenuation lesions in the kidneys are too small to characterize. There may be a punctate stone in the upper pole left kidney. No specific follow-up necessary. Ureters are decompressed. Bladder is grossly unremarkable. Stomach/Bowel: There may be mild proximal gastric wall thickening. Previously questioned masslike area along the greater curvature of the stomach is not readily appreciated. Small bowel, appendix and colon are unremarkable. Vascular/Lymphatic: Abdominal aorta measures 3.3 cm at the level of the superior mesenteric artery and 3.6 cm below the level of the renal arteries. Atherosclerotic calcification of the aorta. Right common iliac artery measures 2.5 cm. No pathologically  enlarged lymph  nodes. Reproductive: Uterus is visualized. No adnexal mass. 2.2 cm left perineal cyst. Other: No free fluid. Mesenteries and peritoneum are unremarkable. Elevated left hemidiaphragm. Musculoskeletal: Degenerative changes in the spine. No worrisome lytic or sclerotic lesions. IMPRESSION: 1. High attenuation caliceal filling defect in the upper pole right kidney without enhancement, with an additional linear filling defect in the right renal pelvis, findings indicative of hemorrhage/clot. Difficult to definitively exclude malignancy. Associated fullness of the right renal pelvis and right intrarenal collecting system. 2. Possible punctate stone in the upper pole left kidney. 3. Probable proximal gastric wall thickening. Previously questioned masslike area within the greater curvature of the gastric fundus is not readily appreciated. 4. Hepatic steatosis. 5. 3.6 cm infrarenal aortic aneurysm. Recommend follow-up every 2 years. This recommendation follows ACR consensus guidelines: White Paper of the ACR Incidental Findings Committee II on Vascular Findings. J Am Coll Radiol 2013; 10:789-794. 6. Aortic atherosclerosis (ICD10-I70.0). Coronary artery calcification. Right common iliac artery aneurysm. Electronically Signed   By: Lorin Picket M.D.   On: 07/12/2022 08:38    Assessment/Plan: 66 year old female with history of uterine bleeding found to have a uterine polyp, pulmonary embolism on warfarin who is admitted with persistent gross hematuria.  CTU 07/12/22 - clot within right renal pelvis without obstruction or concerning lesions. Left punctate renal stone.  - No indication for acute urologic surgical intervention at this time. - Continue empiric IV antibiotics.  Follow-up final urine culture results and tailor appropriately. Will need a total 7 day course of antibiotics for UTI. - Agree with continuing to hold warfarin at this time as her INR continues to be supratherapeutic and also in the setting of  gross hematuria. - Continue to trend hemoglobin. Please alert urology team if she requires blood transfusions secondary to the gross hematuria. - She will need cystoscopy to evaluate lower tract at some point which can be done outpatient if patient remains stable.   Josph Macho, MD 07/13/2022, 6:33 AM Resident Physician [facility]

## 2022-07-13 NOTE — Progress Notes (Signed)
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  She also noticed blood in her urine for 3 days.   In the emergency room afebrile.  96% on room air.  Hemoglobin 11.2, repeat 9.9.  INR 5.1.  CT scan with suspected 3.9 cm legs of attic area at the gastric fundus.  Seen by urology.  Assessment & Plan:   Gross hematuria in a patient with supratherapeutic INR: Abdominal and flank pain. -Hemoglobin is stable, continue to monitor.  Transfuse for less than 8. -Followed by urology, anticipating conservative management. -CT urogram with bleeding/hematoma right pelvicalyceal system likely source of bleeding. -Holding Coumadin until INR is less than 2. -UTI present on admission, growing pansensitive E. coli.  On Rocephin.  Changed to Keflex.  Vaginal bleeding: With history of fibroids and endometrial polyp status post polypectomy.  More likely bleeding from urethra.  Abnormal thickening of gastric fundus: Endoscopy when INR corrects.  Likely tomorrow.  Followed by GI.  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  3 today.  Continue monitoring.  Last thromboembolism event in 2019, can hold anticoagulation.  She also has IVC filter placed.  Essential hypertension: Holding home hypertensive medications.   DVT prophylaxis: Place and maintain sequential compression device Start: 07/11/22 0741 SCDs Start: 07/11/22 0416   Code Status: Full code Family Communication: None Disposition Plan: Status is: Inpatient.  Inpatient procedures planned.  Still has pain and hematuria.   Consultants:   Urology Gastroenterology  Procedures:  None  Antimicrobials:  Rocephin 9/26   Subjective: Patient seen and examined.  Denies any nausea or vomiting.  Continues to have bilateral flank pain and discomfort.  Denies any dysuria.  Urine is clearing and light tinge in color.  Objective: Vitals:   07/12/22 1320 07/12/22 2240 07/13/22 0612 07/13/22 0830  BP: (!) 160/102 (!) 162/98 (!) 156/110 (!) 159/103  Pulse: 86 92 97 87  Resp: '16 18 20 18  '$ Temp: 98.2 F (36.8 C) 98.1 F (36.7 C) 98.6 F (37 C) 98.4 F (36.9 C)  TempSrc: Oral Oral Oral Oral  SpO2: 98% 97% 97% 97%  Weight:      Height:        Intake/Output Summary (Last 24 hours) at 07/13/2022 1357 Last data filed at 07/13/2022 0631 Gross per 24 hour  Intake 120 ml  Output 500 ml  Net -380 ml   Filed Weights   07/10/22 1217  Weight: 85.7 kg    Examination:  General exam: Appears anxious and distressed with pain.  On room air. Respiratory system: Clear to auscultation. Respiratory effort normal. Cardiovascular system: S1 & S2 heard, RRR. No JVD, murmurs, rubs, gallops or clicks. No pedal edema. Gastrointestinal system: Abdomen is nondistended, soft and nontender. No organomegaly or masses felt. Normal bowel sounds heard. Central nervous system: Alert and oriented. No focal neurological deficits. Extremities: Symmetric 5 x 5 power.   Data Reviewed: I have personally reviewed following labs and imaging studies  CBC: Recent Labs  Lab 07/10/22 1223 07/11/22 0600 07/11/22 0743 07/11/22 2027 07/12/22 0455 07/12/22 0937 07/12/22 1653 07/13/22 0424  WBC 8.9 8.5  --   --   --  9.9  --   --   NEUTROABS 4.7 4.8  --   --   --   --   --   --   HGB 11.2* 9.9*   < > 10.1* 9.6* 10.5* 11.1* 11.3*  HCT 36.4 31.2*   < > 32.2* 30.8* 33.1* 35.8* 36.5  MCV 87.1 86.0  --   --   --  86.6  --   --   PLT 329 251  --   --   --  278  --   --    < > = values in this interval not displayed.   Basic Metabolic Panel: Recent Labs   Lab 07/10/22 1223 07/11/22 0600  NA 142 143  K 4.2 4.1  CL 110 112*  CO2 25 23  GLUCOSE 99 86  BUN 19 21  CREATININE 1.10* 0.95  CALCIUM 9.1 8.8*  MG  --  1.8   GFR: Estimated Creatinine Clearance: 60 mL/min (by C-G formula based on SCr of 0.95 mg/dL). Liver Function Tests: Recent Labs  Lab 07/10/22 1223 07/11/22 0600  AST 14* 15  ALT 14 15  ALKPHOS 67 65  BILITOT 0.8 0.9  PROT 7.4 6.7  ALBUMIN 3.5 3.2*   Recent Labs  Lab 07/10/22 1223  LIPASE 26   No results for input(s): "AMMONIA" in the last 168 hours. Coagulation Profile: Recent Labs  Lab 07/10/22 2316 07/11/22 0622 07/12/22 0937 07/13/22 0424  INR 5.1* 5.2* 3.8* 3.1*   Cardiac Enzymes: No results for input(s): "CKTOTAL", "CKMB", "CKMBINDEX", "TROPONINI" in the last 168 hours. BNP (last 3 results) No results for input(s): "PROBNP" in the last 8760 hours. HbA1C: No results for input(s): "HGBA1C" in the last 72 hours. CBG: No results for input(s): "GLUCAP" in the last 168 hours. Lipid Profile: No results for input(s): "CHOL", "HDL", "LDLCALC", "TRIG", "CHOLHDL", "LDLDIRECT" in the last 72 hours. Thyroid Function Tests: No results for input(s): "TSH", "T4TOTAL", "FREET4", "T3FREE", "THYROIDAB" in the last 72 hours. Anemia Panel: No results for input(s): "VITAMINB12", "FOLATE", "FERRITIN", "TIBC", "IRON", "RETICCTPCT" in the last 72 hours. Sepsis Labs: No results for input(s): "PROCALCITON", "LATICACIDVEN" in the last 168 hours.  Recent Results (from the past 240 hour(s))  Urine Culture     Status: Abnormal   Collection Time: 07/11/22  1:00 AM   Specimen: In/Out Cath Urine  Result Value Ref Range Status   Specimen Description   Final    IN/OUT CATH URINE Performed at [facility] [address] [place], [place] [address]    Special Requests   Final    NONE Performed at [facility], [place] [address] [place], [place] [address]    Culture >=100,000  COLONIES/mL ESCHERICHIA COLI (A)  Final   Report Status 07/13/2022 FINAL  Final   Organism ID, Bacteria ESCHERICHIA COLI (A)  Final      Susceptibility   Escherichia coli - MIC*    AMPICILLIN <=2 SENSITIVE Sensitive     CEFAZOLIN <=4 SENSITIVE Sensitive     CEFEPIME <=0.12 SENSITIVE Sensitive     CEFTRIAXONE <=0.25 SENSITIVE Sensitive     CIPROFLOXACIN <=0.25 SENSITIVE Sensitive     GENTAMICIN <=1 SENSITIVE Sensitive     IMIPENEM <=0.25 SENSITIVE Sensitive     NITROFURANTOIN <=16 SENSITIVE Sensitive     TRIMETH/SULFA <=20 SENSITIVE Sensitive     AMPICILLIN/SULBACTAM <=2 SENSITIVE Sensitive     PIP/TAZO <=4 SENSITIVE Sensitive     * >=100,000 COLONIES/mL ESCHERICHIA COLI  Radiology Studies: CT ABDOMEN PELVIS W WO CONTRAST  Result Date: 07/12/2022 CLINICAL DATA:  Hematuria, right-sided abdominal pain radiating to the right leg. EXAM: CT ABDOMEN AND PELVIS WITHOUT AND WITH CONTRAST TECHNIQUE: Multidetector CT imaging of the abdomen and pelvis was performed following the standard protocol before and following the bolus administration of intravenous contrast. RADIATION DOSE REDUCTION: This exam was performed according to the departmental dose-optimization program which includes automated exposure control, adjustment of the mA and/or kV according to patient size and/or use of iterative reconstruction technique. CONTRAST:  154m OMNIPAQUE IOHEXOL 300 MG/ML  SOLN COMPARISON:  07/10/2022. FINDINGS: Lower chest: Bibasilar volume loss, right greater than left. Heart size within normal limits. Atherosclerotic calcification of the aorta and coronary arteries. No pericardial or pleural effusion. Distal esophagus is grossly unremarkable. Hepatobiliary: Liver is decreased in attenuation diffusely. Cholecystectomy. No biliary ductal dilatation. Pancreas: Negative. Spleen: Negative. Adrenals/Urinary Tract: Right adrenal gland is unremarkable. Nodular thickening of the medial limb left adrenal gland,  unchanged. No specific follow-up necessary. Right renal edema with subtle decreased attenuation of the right renal parenchyma on portal venous phase imaging. High attenuation filling defect in the upper pole calices (22/53 does not appear to enhance, measuring at least 1.5 x 1.8 cm (10/11). An additional linear filling defect is seen within the right renal pelvis on delayed imaging (10/15). Associated fullness of the right intrarenal collecting system and right renal pelvis. Poor opacification of the visualized portion of the upper right ureter. 2.2 cm nonenhancing cyst in interpolar left kidney (4/30). No follow-up necessary. Additional subcentimeter low-attenuation lesions in the kidneys are too small to characterize. There may be a punctate stone in the upper pole left kidney. No specific follow-up necessary. Ureters are decompressed. Bladder is grossly unremarkable. Stomach/Bowel: There may be mild proximal gastric wall thickening. Previously questioned masslike area along the greater curvature of the stomach is not readily appreciated. Small bowel, appendix and colon are unremarkable. Vascular/Lymphatic: Abdominal aorta measures 3.3 cm at the level of the superior mesenteric artery and 3.6 cm below the level of the renal arteries. Atherosclerotic calcification of the aorta. Right common iliac artery measures 2.5 cm. No pathologically enlarged lymph nodes. Reproductive: Uterus is visualized. No adnexal mass. 2.2 cm left perineal cyst. Other: No free fluid. Mesenteries and peritoneum are unremarkable. Elevated left hemidiaphragm. Musculoskeletal: Degenerative changes in the spine. No worrisome lytic or sclerotic lesions. IMPRESSION: 1. High attenuation caliceal filling defect in the upper pole right kidney without enhancement, with an additional linear filling defect in the right renal pelvis, findings indicative of hemorrhage/clot. Difficult to definitively exclude malignancy. Associated fullness of the right  renal pelvis and right intrarenal collecting system. 2. Possible punctate stone in the upper pole left kidney. 3. Probable proximal gastric wall thickening. Previously questioned masslike area within the greater curvature of the gastric fundus is not readily appreciated. 4. Hepatic steatosis. 5. 3.6 cm infrarenal aortic aneurysm. Recommend follow-up every 2 years. This recommendation follows ACR consensus guidelines: White Paper of the ACR Incidental Findings Committee II on Vascular Findings. J Am Coll Radiol 2013; 10:789-794. 6. Aortic atherosclerosis (ICD10-I70.0). Coronary artery calcification. Right common iliac artery aneurysm. Electronically Signed   By: MLorin PicketM.D.   On: 07/12/2022 08:38        Scheduled Meds:  [START ON 07/14/2022] cefadroxil  500 mg Oral BID   Continuous Infusions:     LOS: 0 days    Time spent: 35 minutes    KBarb Merino MD [facility] Pager [phone]

## 2022-07-14 DIAGNOSIS — R310 Gross hematuria: Secondary | ICD-10-CM | POA: Diagnosis not present

## 2022-07-14 LAB — PROTIME-INR
INR: 3.7 — ABNORMAL HIGH (ref 0.8–1.2)
Prothrombin Time: 36.5 seconds — ABNORMAL HIGH (ref 11.4–15.2)

## 2022-07-14 LAB — HEMOGLOBIN AND HEMATOCRIT, BLOOD
HCT: 30.6 % — ABNORMAL LOW (ref 36.0–46.0)
Hemoglobin: 9.6 g/dL — ABNORMAL LOW (ref 12.0–15.0)

## 2022-07-14 MED ORDER — CARVEDILOL 25 MG PO TABS
25.0000 mg | ORAL_TABLET | Freq: Two times a day (BID) | ORAL | Status: DC
  Administered 2022-07-14 – 2022-07-18 (×8): 25 mg via ORAL
  Filled 2022-07-14 (×9): qty 1

## 2022-07-14 NOTE — Progress Notes (Signed)
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  She also noticed blood in her urine for 3 days.   In the emergency room afebrile.  96% on room air.  Hemoglobin 11.2, repeat 9.9.  INR 5.1.    Assessment & Plan:   Gross hematuria in a patient with supratherapeutic INR: Abdominal and flank pain. -Hemoglobin is stable, continue to monitor.  Transfuse for less than 8. -Followed by urology, anticipating conservative management. -CT urogram with bleeding/hematoma right pelvicalyceal system likely source of bleeding. -Holding Coumadin until INR is less than 2. -UTI present on admission, growing pansensitive E. coli.  On Rocephin.  Changed to Keflex.  Vaginal bleeding: With history of fibroids and endometrial polyp status post polypectomy.  More likely bleeding from urethra.  Abnormal thickening of gastric fundus: Endoscopy when INR corrects.  Likely tomorrow.  Followed by GI.  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  3.8 today.  Continue monitoring.  Last thromboembolism event in 2019, can hold anticoagulation.  She also has IVC filter placed. Since patient has history of massive pulmonary embolism, will be hesitant to reverse INR unless life-threatening emergency.  Essential hypertension: Holding home hypertensive medications.  Mobilize patient, decreased narcotic use.  Anticipate discharge after rechallenge with Coumadin after the procedure.   DVT prophylaxis: Place and maintain sequential compression device Start: 07/11/22 0741 SCDs Start: 07/11/22 0416   Code Status: Full code Family  Communication: Daughter at the bedside Disposition Plan: Status is: Inpatient.  Inpatient procedures planned.  Still has pain and hematuria.   Consultants:  Urology Gastroenterology  Procedures:  None  Antimicrobials:  Rocephin 9/26   Subjective: Patient seen and examined.  Still has bilateral flank pain, however somehow better than before.  She is still using IV morphine but she tells me she has mild pain.  Denies any nausea or vomiting.  Urine is still blood-tinged but no suprapubic pain or cramping.  She has not have any bowel movement for the last 4 days.  Objective: Vitals:   07/13/22 2054 07/14/22 0500 07/14/22 0536 07/14/22 1305  BP: 117/87  110/64 (!) 142/91  Pulse: 73  89 94  Resp: '18 18 18 14  '$ Temp: 97.6 F (36.4 C)  97.9 F (36.6 C) 98.7 F (37.1 C)  TempSrc: Oral  Oral Oral  SpO2: 97%  98% 97%  Weight:   90.1 kg   Height:        Intake/Output Summary (Last 24 hours) at 07/14/2022 1329 Last data filed at 07/14/2022 1305 Gross per 24 hour  Intake --  Output 400 ml  Net -400 ml   Filed Weights   07/10/22 1217 07/14/22 0536  Weight: 85.7 kg 90.1 kg    Examination:  General exam: Appears fairly comfortable today.  On room air. Respiratory system: Clear to auscultation. Respiratory effort normal. Cardiovascular system: S1 & S2 heard, RRR. No JVD, murmurs, rubs, gallops or clicks. No pedal edema. Gastrointestinal system: Abdomen is nondistended, soft and mild tenderness along the right lateral quadrant and flank.  No rigidity or guarding. No organomegaly or masses felt. Normal bowel sounds heard. Central nervous system: Alert and oriented.  No focal neurological deficits. Extremities: Symmetric 5 x 5 power.   Data Reviewed: I have personally reviewed following labs and imaging studies  CBC: Recent Labs  Lab 07/10/22 1223 07/11/22 0600 07/11/22 0743 07/12/22 0937 07/12/22 1653 07/13/22 0424 07/13/22 1649 07/14/22 0444  WBC 8.9 8.5  --  9.9  --    --   --   --   NEUTROABS 4.7 4.8  --   --   --   --   --   --   HGB 11.2* 9.9*   < > 10.5* 11.1* 11.3* 9.9* 9.6*  HCT 36.4 31.2*   < > 33.1* 35.8* 36.5 31.6* 30.6*  MCV 87.1 86.0  --  86.6  --   --   --   --   PLT 329 251  --  278  --   --   --   --    < > = values in this interval not displayed.   Basic Metabolic Panel: Recent Labs  Lab 07/10/22 1223 07/11/22 0600  NA 142 143  K 4.2 4.1  CL 110 112*  CO2 25 23  GLUCOSE 99 86  BUN 19 21  CREATININE 1.10* 0.95  CALCIUM 9.1 8.8*  MG  --  1.8   GFR: Estimated Creatinine Clearance: 61.6 mL/min (by C-G formula based on SCr of 0.95 mg/dL). Liver Function Tests: Recent Labs  Lab 07/10/22 1223 07/11/22 0600  AST 14* 15  ALT 14 15  ALKPHOS 67 65  BILITOT 0.8 0.9  PROT 7.4 6.7  ALBUMIN 3.5 3.2*   Recent Labs  Lab 07/10/22 1223  LIPASE 26   No results for input(s): "AMMONIA" in the last 168 hours. Coagulation Profile: Recent Labs  Lab 07/10/22 2316 07/11/22 0622 07/12/22 0937 07/13/22 0424 07/14/22 0444  INR 5.1* 5.2* 3.8* 3.1* 3.7*   Cardiac Enzymes: No results for input(s): "CKTOTAL", "CKMB", "CKMBINDEX", "TROPONINI" in the last 168 hours. BNP (last 3 results) No results for input(s): "PROBNP" in the last 8760 hours. HbA1C: No results for input(s): "HGBA1C" in the last 72 hours. CBG: No results for input(s): "GLUCAP" in the last 168 hours. Lipid Profile: No results for input(s): "CHOL", "HDL", "LDLCALC", "TRIG", "CHOLHDL", "LDLDIRECT" in the last 72 hours. Thyroid Function Tests: No results for input(s): "TSH", "T4TOTAL", "FREET4", "T3FREE", "THYROIDAB" in the last 72 hours. Anemia Panel: No results for input(s): "VITAMINB12", "FOLATE", "FERRITIN", "TIBC", "IRON", "RETICCTPCT" in the last 72 hours. Sepsis Labs: No results for input(s): "PROCALCITON", "LATICACIDVEN" in the last 168 hours.  Recent Results (from the past 240 hour(s))  Urine Culture     Status: Abnormal   Collection Time: 07/11/22  1:00 AM    Specimen: In/Out Cath Urine  Result Value Ref Range Status   Specimen Description   Final    IN/OUT CATH URINE Performed at [facility] [address] [place], [place] [address]    Special Requests   Final    NONE Performed at [facility], [place] [address], [place], [place] [address]    Culture >=100,000 COLONIES/mL ESCHERICHIA COLI (A)  Final   Report Status 07/13/2022 FINAL  Final   Organism ID, Bacteria ESCHERICHIA COLI (A)  Final      Susceptibility   Escherichia coli - MIC*    AMPICILLIN <=2 SENSITIVE Sensitive     CEFAZOLIN <=4 SENSITIVE Sensitive     CEFEPIME <=0.12 SENSITIVE Sensitive     CEFTRIAXONE <=0.25 SENSITIVE Sensitive     CIPROFLOXACIN <=0.25 SENSITIVE Sensitive  GENTAMICIN <=1 SENSITIVE Sensitive     IMIPENEM <=0.25 SENSITIVE Sensitive     NITROFURANTOIN <=16 SENSITIVE Sensitive     TRIMETH/SULFA <=20 SENSITIVE Sensitive     AMPICILLIN/SULBACTAM <=2 SENSITIVE Sensitive     PIP/TAZO <=4 SENSITIVE Sensitive     * >=100,000 COLONIES/mL ESCHERICHIA COLI         Radiology Studies: No results found.      Scheduled Meds:  cefadroxil  500 mg Oral BID   Continuous Infusions:     LOS: 1 day    Time spent: 35 minutes    Barb Merino, MD [facility] Pager [phone]

## 2022-07-14 NOTE — Progress Notes (Signed)
   07/14/22 1711  Assess: MEWS Score  Temp 98.9 F (37.2 C)  BP 119/81  MAP (mmHg) 93  Pulse Rate (!) 155  ECG Heart Rate (!) 154  Resp 16  SpO2 96 %  O2 Device Room Air  Assess: MEWS Score  MEWS Temp 0  MEWS Systolic 0  MEWS Pulse 3  MEWS RR 0  MEWS LOC 0  MEWS Score 3  MEWS Score Color Yellow  Assess: if the MEWS score is Yellow or Red  Were vital signs taken at a resting state? Yes  Focused Assessment Change from prior assessment (see assessment flowsheet)  Does the patient meet 2 or more of the SIRS criteria? Yes  Does the patient have a confirmed or suspected source of infection? Yes  Provider and Rapid Response Notified? No  MEWS guidelines implemented *See Row Information* Yes  Treat  MEWS Interventions Administered scheduled meds/treatments  Take Vital Signs  Increase Vital Sign Frequency  Yellow: Q 2hr X 2 then Q 4hr X 2, if remains yellow, continue Q 4hrs  Escalate  MEWS: Escalate Yellow: discuss with charge nurse/RN and consider discussing with provider and RRT  Notify: Charge Nurse/RN  Name of Charge Nurse/RN Notified Chancy Hurter, RN  Date Charge Nurse/RN Notified 07/14/22  Time Charge Nurse/RN Notified 1720  Notify: Provider  Provider Name/Title Dr. Sloan Leiter  Date Provider Notified 07/14/22  Time Provider Notified 1638  Method of Notification Page  Notification Reason Change in status  Provider response See new orders  Date of Provider Response 07/14/22  Time of Provider Response 1645  Assess: SIRS CRITERIA  SIRS Temperature  0  SIRS Pulse 1  SIRS Respirations  0  SIRS WBC 1  SIRS Score Sum  2

## 2022-07-14 NOTE — Plan of Care (Signed)
  Problem: Health Behavior/Discharge Planning: Goal: Ability to manage health-related needs will improve Outcome: Progressing   Problem: Nutrition: Goal: Adequate nutrition will be maintained Outcome: Progressing   Problem: Nutrition: Goal: Adequate nutrition will be maintained Outcome: Progressing   Problem: Safety: Goal: Ability to remain free from injury will improve Outcome: Progressing   Problem: Pain Managment: Goal: General experience of comfort will improve Outcome: Progressing

## 2022-07-15 ENCOUNTER — Inpatient Hospital Stay (HOSPITAL_COMMUNITY): Payer: Medicare HMO

## 2022-07-15 DIAGNOSIS — R310 Gross hematuria: Secondary | ICD-10-CM | POA: Diagnosis not present

## 2022-07-15 LAB — CBC WITH DIFFERENTIAL/PLATELET
Abs Immature Granulocytes: 0.07 10*3/uL (ref 0.00–0.07)
Basophils Absolute: 0.1 10*3/uL (ref 0.0–0.1)
Basophils Relative: 0 %
Eosinophils Absolute: 0.1 10*3/uL (ref 0.0–0.5)
Eosinophils Relative: 1 %
HCT: 28.4 % — ABNORMAL LOW (ref 36.0–46.0)
Hemoglobin: 8.8 g/dL — ABNORMAL LOW (ref 12.0–15.0)
Immature Granulocytes: 0 %
Lymphocytes Relative: 17 %
Lymphs Abs: 2.7 10*3/uL (ref 0.7–4.0)
MCH: 27.2 pg (ref 26.0–34.0)
MCHC: 31 g/dL (ref 30.0–36.0)
MCV: 87.7 fL (ref 80.0–100.0)
Monocytes Absolute: 1.8 10*3/uL — ABNORMAL HIGH (ref 0.1–1.0)
Monocytes Relative: 11 %
Neutro Abs: 11 10*3/uL — ABNORMAL HIGH (ref 1.7–7.7)
Neutrophils Relative %: 71 %
Platelets: 253 10*3/uL (ref 150–400)
RBC: 3.24 MIL/uL — ABNORMAL LOW (ref 3.87–5.11)
RDW: 16.1 % — ABNORMAL HIGH (ref 11.5–15.5)
WBC: 15.8 10*3/uL — ABNORMAL HIGH (ref 4.0–10.5)
nRBC: 0 % (ref 0.0–0.2)

## 2022-07-15 LAB — PROTIME-INR
INR: 2.6 — ABNORMAL HIGH (ref 0.8–1.2)
Prothrombin Time: 27.3 seconds — ABNORMAL HIGH (ref 11.4–15.2)

## 2022-07-15 MED ORDER — IOHEXOL 300 MG/ML  SOLN
100.0000 mL | Freq: Once | INTRAMUSCULAR | Status: AC | PRN
  Administered 2022-07-15: 100 mL via INTRAVENOUS

## 2022-07-15 MED ORDER — IOHEXOL 9 MG/ML PO SOLN
ORAL | Status: AC
  Administered 2022-07-15: 500 mL
  Filled 2022-07-15: qty 1000

## 2022-07-15 MED ORDER — ENSURE ENLIVE PO LIQD
237.0000 mL | Freq: Two times a day (BID) | ORAL | Status: DC
  Administered 2022-07-15 – 2022-07-16 (×2): 237 mL via ORAL

## 2022-07-15 NOTE — Progress Notes (Signed)
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  She also noticed blood in her urine for 3 days.   In the emergency room afebrile.  96% on room air.  Hemoglobin 11.2, repeat 9.9.  INR 5.1.    Assessment & Plan:   Gross hematuria in a patient with supratherapeutic INR: Abdominal and flank pain. -Hemoglobin is stable, continue to monitor.  Transfuse for less than 8. -Followed by urology, anticipating conservative management. -CT urogram with bleeding/hematoma right pelvicalyceal system likely source of bleeding. -Holding Coumadin until INR is less than 2. -UTI present on admission, growing pansensitive E. coli.  On Rocephin.  Changed to Keflex. -Patient complains of ongoing pain not relieved and needing IV opiates, will recheck CT scan abdomen pelvis today to evaluate her right pelvicalyceal hematoma.  Vaginal bleeding: With history of fibroids and endometrial polyp status post polypectomy.  More likely bleeding from urethra.  Abnormal thickening of gastric fundus: Endoscopy when INR corrects.  Likely tomorrow.  Followed by GI. Plan for endoscopy when INR is less than 2.  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  2.6 today.  Continue monitoring.  Last thromboembolism event in 2019, can hold anticoagulation.  She also has IVC filter placed. Since patient has history of massive pulmonary embolism, will be hesitant to reverse INR unless life-threatening emergency.  Essential hypertension: Resume beta-blockers.  Mobilize patient, decreased narcotic use.  Anticipate discharge after rechallenge with Coumadin  after the procedure.   DVT prophylaxis: Place and maintain sequential compression device Start: 07/11/22 0741 SCDs Start: 07/11/22 0416   Code Status: Full code Family Communication: Daughter at the bedside 9/29. Disposition Plan: Status is: Inpatient.  Inpatient procedures planned.  Still has pain and hematuria.   Consultants:  Urology Gastroenterology  Procedures:  None  Antimicrobials:  Rocephin 9/26   Subjective: seen and examined.  Continues to complain of right flank pain.  Urine is clearing with occasional tinge of blood.  Denies any nausea or vomiting.  She was able to have a bowel movement.  She has difficulty walking even at home and walks with a walker.  Anxious.  Objective: Vitals:   07/14/22 1859 07/14/22 2107 07/15/22 0036 07/15/22 0340  BP: (!) 143/98 118/87 118/79 109/77  Pulse: (!) 113 95 94 86  Resp: 20   18  Temp: 99.3 F (37.4 C) 99.3 F (37.4 C) 98.4 F (36.9 C) 98.2 F (36.8 C)  TempSrc: Oral Oral Oral Oral  SpO2: 97% 97% 98% 96%  Weight:    93.2 kg  Height:        Intake/Output Summary (Last 24 hours) at 07/15/2022 1208 Last data filed at 07/15/2022 0900 Gross per 24 hour  Intake 717 ml  Output 550 ml  Net 167 ml   Filed Weights   07/10/22 1217 07/14/22 0536 07/15/22 0340  Weight: 85.7 kg 90.1 kg 93.2 kg    Examination:  General exam: Appears fairly comfortable with pain medications.  Anxious on exam.  Respiratory system: Clear to auscultation. Respiratory effort normal. Cardiovascular system: S1 & S2 heard, RRR. No JVD, murmurs, rubs, gallops or clicks. No pedal edema. Gastrointestinal system:  Abdomen is nondistended, soft and mild tenderness along the right lateral quadrant and flank.  No rigidity or guarding. No organomegaly or masses felt. Normal bowel sounds heard. Central nervous system: Alert and oriented. No focal neurological deficits. Extremities: Symmetric 5 x 5 power.   Data Reviewed: I have personally reviewed  following labs and imaging studies  CBC: Recent Labs  Lab 07/10/22 1223 07/11/22 0600 07/11/22 0743 07/12/22 0937 07/12/22 1653 07/13/22 0424 07/13/22 1649 07/14/22 0444 07/15/22 0523  WBC 8.9 8.5  --  9.9  --   --   --   --  15.8*  NEUTROABS 4.7 4.8  --   --   --   --   --   --  11.0*  HGB 11.2* 9.9*   < > 10.5* 11.1* 11.3* 9.9* 9.6* 8.8*  HCT 36.4 31.2*   < > 33.1* 35.8* 36.5 31.6* 30.6* 28.4*  MCV 87.1 86.0  --  86.6  --   --   --   --  87.7  PLT 329 251  --  278  --   --   --   --  253   < > = values in this interval not displayed.   Basic Metabolic Panel: Recent Labs  Lab 07/10/22 1223 07/11/22 0600  NA 142 143  K 4.2 4.1  CL 110 112*  CO2 25 23  GLUCOSE 99 86  BUN 19 21  CREATININE 1.10* 0.95  CALCIUM 9.1 8.8*  MG  --  1.8   GFR: Estimated Creatinine Clearance: 62.7 mL/min (by C-G formula based on SCr of 0.95 mg/dL). Liver Function Tests: Recent Labs  Lab 07/10/22 1223 07/11/22 0600  AST 14* 15  ALT 14 15  ALKPHOS 67 65  BILITOT 0.8 0.9  PROT 7.4 6.7  ALBUMIN 3.5 3.2*   Recent Labs  Lab 07/10/22 1223  LIPASE 26   No results for input(s): "AMMONIA" in the last 168 hours. Coagulation Profile: Recent Labs  Lab 07/11/22 0622 07/12/22 0937 07/13/22 0424 07/14/22 0444 07/15/22 0523  INR 5.2* 3.8* 3.1* 3.7* 2.6*   Cardiac Enzymes: No results for input(s): "CKTOTAL", "CKMB", "CKMBINDEX", "TROPONINI" in the last 168 hours. BNP (last 3 results) No results for input(s): "PROBNP" in the last 8760 hours. HbA1C: No results for input(s): "HGBA1C" in the last 72 hours. CBG: No results for input(s): "GLUCAP" in the last 168 hours. Lipid Profile: No results for input(s): "CHOL", "HDL", "LDLCALC", "TRIG", "CHOLHDL", "LDLDIRECT" in the last 72 hours. Thyroid Function Tests: No results for input(s): "TSH", "T4TOTAL", "FREET4", "T3FREE", "THYROIDAB" in the last 72 hours. Anemia Panel: No results for input(s): "VITAMINB12", "FOLATE", "FERRITIN", "TIBC",  "IRON", "RETICCTPCT" in the last 72 hours. Sepsis Labs: No results for input(s): "PROCALCITON", "LATICACIDVEN" in the last 168 hours.  Recent Results (from the past 240 hour(s))  Urine Culture     Status: Abnormal   Collection Time: 07/11/22  1:00 AM   Specimen: In/Out Cath Urine  Result Value Ref Range Status   Specimen Description   Final    IN/OUT CATH URINE Performed at [facility] [address] [place], [place] [address]    Special Requests   Final    NONE Performed at [facility], [place] [address] [place], [place] [address]    Culture >=100,000 COLONIES/mL ESCHERICHIA COLI (A)  Final   Report Status 07/13/2022 FINAL  Final   Organism ID, Bacteria ESCHERICHIA COLI (A)  Final      Susceptibility   Escherichia  coli - MIC*    AMPICILLIN <=2 SENSITIVE Sensitive     CEFAZOLIN <=4 SENSITIVE Sensitive     CEFEPIME <=0.12 SENSITIVE Sensitive     CEFTRIAXONE <=0.25 SENSITIVE Sensitive     CIPROFLOXACIN <=0.25 SENSITIVE Sensitive     GENTAMICIN <=1 SENSITIVE Sensitive     IMIPENEM <=0.25 SENSITIVE Sensitive     NITROFURANTOIN <=16 SENSITIVE Sensitive     TRIMETH/SULFA <=20 SENSITIVE Sensitive     AMPICILLIN/SULBACTAM <=2 SENSITIVE Sensitive     PIP/TAZO <=4 SENSITIVE Sensitive     * >=100,000 COLONIES/mL ESCHERICHIA COLI         Radiology Studies: No results found.      Scheduled Meds:  carvedilol  25 mg Oral BID WC   cefadroxil  500 mg Oral BID   Continuous Infusions:     LOS: 2 days    Time spent: 35 minutes    Barb Merino, MD [facility] Pager [phone]

## 2022-07-15 NOTE — Progress Notes (Signed)
PT Cancellation Note  Patient Details Name: CORYNNE SCIBILIA MRN: [id number] DOB: 1955/12/04   Cancelled Treatment:    Reason Eval/Treat Not Completed: Pain limiting ability to participate, will check back another time when able to tolerate mobility. [place] Office [phone] Weekend pager-[phone]    Claretha Cooper 07/15/2022, 11:48 AM

## 2022-07-15 NOTE — Progress Notes (Signed)
   07/15/22 0644  Urine Characteristics  Time patient last voided or urinary catheter emptied 0246  Urinary Interventions Bladder scan  Bladder Scan Volume (mL) 13 mL  Post Void Cath Residual (mL)  (bladder scan)   Patient is AOx3. Patient is not drinking much or on any IV fluids at this time. Encourage to drink. Patient voided 1900-200 ml, 2300-19m, 0300-562m Bladder scanned showed only 13 ml in bladder. Will continue to monitor and make oncoming nurse aware.

## 2022-07-16 DIAGNOSIS — R310 Gross hematuria: Secondary | ICD-10-CM | POA: Diagnosis not present

## 2022-07-16 DIAGNOSIS — R933 Abnormal findings on diagnostic imaging of other parts of digestive tract: Secondary | ICD-10-CM | POA: Diagnosis not present

## 2022-07-16 DIAGNOSIS — D62 Acute posthemorrhagic anemia: Secondary | ICD-10-CM | POA: Diagnosis not present

## 2022-07-16 LAB — CBC WITH DIFFERENTIAL/PLATELET
Abs Immature Granulocytes: 0.1 10*3/uL — ABNORMAL HIGH (ref 0.00–0.07)
Basophils Absolute: 0.1 10*3/uL (ref 0.0–0.1)
Basophils Relative: 0 %
Eosinophils Absolute: 0.2 10*3/uL (ref 0.0–0.5)
Eosinophils Relative: 1 %
HCT: 26.9 % — ABNORMAL LOW (ref 36.0–46.0)
Hemoglobin: 8.5 g/dL — ABNORMAL LOW (ref 12.0–15.0)
Immature Granulocytes: 1 %
Lymphocytes Relative: 17 %
Lymphs Abs: 2.5 10*3/uL (ref 0.7–4.0)
MCH: 27.1 pg (ref 26.0–34.0)
MCHC: 31.6 g/dL (ref 30.0–36.0)
MCV: 85.7 fL (ref 80.0–100.0)
Monocytes Absolute: 1.8 10*3/uL — ABNORMAL HIGH (ref 0.1–1.0)
Monocytes Relative: 13 %
Neutro Abs: 9.7 10*3/uL — ABNORMAL HIGH (ref 1.7–7.7)
Neutrophils Relative %: 68 %
Platelets: 228 10*3/uL (ref 150–400)
RBC: 3.14 MIL/uL — ABNORMAL LOW (ref 3.87–5.11)
RDW: 16 % — ABNORMAL HIGH (ref 11.5–15.5)
WBC: 14.2 10*3/uL — ABNORMAL HIGH (ref 4.0–10.5)
nRBC: 0 % (ref 0.0–0.2)

## 2022-07-16 LAB — PROTIME-INR
INR: 1.7 — ABNORMAL HIGH (ref 0.8–1.2)
Prothrombin Time: 20 seconds — ABNORMAL HIGH (ref 11.4–15.2)

## 2022-07-16 NOTE — Progress Notes (Signed)
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  She also noticed blood in her urine for 3 days.   In the emergency room afebrile.  96% on room air.  Hemoglobin 11.2, repeat 9.9.  INR 5.1.    Assessment & Plan:   Gross hematuria in a patient with supratherapeutic INR: Abdominal and flank pain. -Hemoglobin is stable, continue to monitor.  Transfuse for less than 8. -Followed by urology, anticipating conservative management. -CT urogram with bleeding/hematoma right pelvicalyceal system likely source of bleeding. -Coumadin was discontinued, INR is 1.7 today.  Hematuria has resolved. -UTI present on admission, growing pansensitive E. coli.  On Rocephin.  Changed to Keflex. -Patient complains of ongoing pain not relieved and needing IV opiates, repeat CT scan shows persistent or increasing hematoma right pelvicalyceal system.  Patient will need cystoscopy and or ureteroscopic one-point, she will need to go home on warfarin. -I sent a message to urologist about doing procedure while she is inpatient and taken off Coumadin.  Vaginal bleeding: With history of fibroids and endometrial polyp status post polypectomy.  More likely bleeding from urethra.  Abnormal thickening of gastric fundus: Endoscopy when INR corrects.  Likely tomorrow.  Followed by GI. No evidence of abnormal mucosal thickening on repeated scans.  Probably artifact.  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  1.7 today.  Continue monitoring.  Last thromboembolism event in 2019, can hold anticoagulation.  She also has IVC filter placed. Since  patient has history of massive pulmonary embolism, she needs to be lifelong on anticoagulation.  Essential hypertension: Resumed beta-blockers.  Anemia of acute blood loss: Hemoglobin drifted down from 11-8.8.  Currently not actively bleeding.  Will benefit with procedures done in the hospital while managing without Coumadin.  Mobilize patient, decreased narcotic use.  Anticipate discharge after rechallenge with Coumadin after the procedure. Inpatient procedures anticipated.   DVT prophylaxis: Place and maintain sequential compression device Start: 07/11/22 0741 SCDs Start: 07/11/22 0416   Code Status: Full code Family Communication: Son at the bedside. Disposition Plan: Status is: Inpatient.  Inpatient procedures planned.  Still has pain .   Consultants:  Urology Gastroenterology  Procedures:  None  Antimicrobials:  Rocephin 9/26   Subjective:  Patient seen and examined.  Still has right flank pain but somehow better than before.  Now managed with oral pain medications.  Urine is clear now.  INR is 1.7.  Objective: Vitals:   07/15/22 2159 07/16/22 0100 07/16/22 0500 07/16/22 0700  BP: 122/83 134/89  130/79  Pulse: 88     Resp:      Temp: 99.9 F (37.7 C) 98.7 F (37.1 C)  99.8 F (37.7 C)  TempSrc: Oral Oral  Oral  SpO2: 98%   99%  Weight:   92 kg   Height:        Intake/Output Summary (Last 24 hours) at 07/16/2022 1351 Last data filed at 07/16/2022 0900 Gross per 24 hour  Intake 120 ml  Output 900 ml  Net -780 ml   Filed Weights   07/14/22 0536 07/15/22 0340 07/16/22 0500  Weight: 90.1 kg 93.2 kg 92 kg  Examination:  General exam: Appears in mild pain and distress.  Somewhat anxious. Respiratory system: Clear to auscultation. Respiratory effort normal. Cardiovascular system: S1 & S2 heard, RRR. No JVD, murmurs, rubs, gallops or clicks. No pedal edema. Gastrointestinal system: Abdomen is nondistended, soft and mild tenderness along the right lateral  quadrant and flank.  No rigidity or guarding. No organomegaly or masses felt. Normal bowel sounds heard. Central nervous system: Alert and oriented. No focal neurological deficits. Extremities: Symmetric 5 x 5 power.   Data Reviewed: I have personally reviewed following labs and imaging studies  CBC: Recent Labs  Lab 07/10/22 1223 07/11/22 0600 07/11/22 0743 07/12/22 0937 07/12/22 1653 07/13/22 0424 07/13/22 1649 07/14/22 0444 07/15/22 0523 07/16/22 0511  WBC 8.9 8.5  --  9.9  --   --   --   --  15.8* 14.2*  NEUTROABS 4.7 4.8  --   --   --   --   --   --  11.0* 9.7*  HGB 11.2* 9.9*   < > 10.5*   < > 11.3* 9.9* 9.6* 8.8* 8.5*  HCT 36.4 31.2*   < > 33.1*   < > 36.5 31.6* 30.6* 28.4* 26.9*  MCV 87.1 86.0  --  86.6  --   --   --   --  87.7 85.7  PLT 329 251  --  278  --   --   --   --  253 228   < > = values in this interval not displayed.   Basic Metabolic Panel: Recent Labs  Lab 07/10/22 1223 07/11/22 0600  NA 142 143  K 4.2 4.1  CL 110 112*  CO2 25 23  GLUCOSE 99 86  BUN 19 21  CREATININE 1.10* 0.95  CALCIUM 9.1 8.8*  MG  --  1.8   GFR: Estimated Creatinine Clearance: 62.3 mL/min (by C-G formula based on SCr of 0.95 mg/dL). Liver Function Tests: Recent Labs  Lab 07/10/22 1223 07/11/22 0600  AST 14* 15  ALT 14 15  ALKPHOS 67 65  BILITOT 0.8 0.9  PROT 7.4 6.7  ALBUMIN 3.5 3.2*   Recent Labs  Lab 07/10/22 1223  LIPASE 26   No results for input(s): "AMMONIA" in the last 168 hours. Coagulation Profile: Recent Labs  Lab 07/12/22 0937 07/13/22 0424 07/14/22 0444 07/15/22 0523 07/16/22 0511  INR 3.8* 3.1* 3.7* 2.6* 1.7*   Cardiac Enzymes: No results for input(s): "CKTOTAL", "CKMB", "CKMBINDEX", "TROPONINI" in the last 168 hours. BNP (last 3 results) No results for input(s): "PROBNP" in the last 8760 hours. HbA1C: No results for input(s): "HGBA1C" in the last 72 hours. CBG: No results for input(s): "GLUCAP" in the last 168 hours. Lipid  Profile: No results for input(s): "CHOL", "HDL", "LDLCALC", "TRIG", "CHOLHDL", "LDLDIRECT" in the last 72 hours. Thyroid Function Tests: No results for input(s): "TSH", "T4TOTAL", "FREET4", "T3FREE", "THYROIDAB" in the last 72 hours. Anemia Panel: No results for input(s): "VITAMINB12", "FOLATE", "FERRITIN", "TIBC", "IRON", "RETICCTPCT" in the last 72 hours. Sepsis Labs: No results for input(s): "PROCALCITON", "LATICACIDVEN" in the last 168 hours.  Recent Results (from the past 240 hour(s))  Urine Culture     Status: Abnormal   Collection Time: 07/11/22  1:00 AM   Specimen: In/Out Cath Urine  Result Value Ref Range Status   Specimen Description   Final    IN/OUT CATH URINE Performed at [facility] [address], [place], [place] [address]    Special Requests   Final  NONE Performed at [facility], [place] [address], [place], [place] [address]    Culture >=100,000 COLONIES/mL ESCHERICHIA COLI (A)  Final   Report Status 07/13/2022 FINAL  Final   Organism ID, Bacteria ESCHERICHIA COLI (A)  Final      Susceptibility   Escherichia coli - MIC*    AMPICILLIN <=2 SENSITIVE Sensitive     CEFAZOLIN <=4 SENSITIVE Sensitive     CEFEPIME <=0.12 SENSITIVE Sensitive     CEFTRIAXONE <=0.25 SENSITIVE Sensitive     CIPROFLOXACIN <=0.25 SENSITIVE Sensitive     GENTAMICIN <=1 SENSITIVE Sensitive     IMIPENEM <=0.25 SENSITIVE Sensitive     NITROFURANTOIN <=16 SENSITIVE Sensitive     TRIMETH/SULFA <=20 SENSITIVE Sensitive     AMPICILLIN/SULBACTAM <=2 SENSITIVE Sensitive     PIP/TAZO <=4 SENSITIVE Sensitive     * >=100,000 COLONIES/mL ESCHERICHIA COLI         Radiology Studies: CT ABDOMEN PELVIS W CONTRAST  Result Date: 07/15/2022 CLINICAL DATA:  Follow-up right renal hematoma/lesion. EXAM: CT ABDOMEN AND PELVIS WITH CONTRAST TECHNIQUE: Multidetector CT imaging of the abdomen and pelvis was performed using the standard protocol following bolus  administration of intravenous contrast. RADIATION DOSE REDUCTION: This exam was performed according to the departmental dose-optimization program which includes automated exposure control, adjustment of the mA and/or kV according to patient size and/or use of iterative reconstruction technique. CONTRAST:  138m OMNIPAQUE IOHEXOL 300 MG/ML  SOLN COMPARISON:  07/12/2022; 07/10/2022 FINDINGS: Lower chest: Limited visualization of the lower thorax demonstrates minimal bibasilar subsegmental atelectasis, right greater than left. Note is again made of a approximately 3.7 x 2.2 cm bulla/cyst within the imaged left lower lobe (image 47, series 4). Normal heart size. Coronary artery calcifications. Calcifications within the aortic valve leaflets. No pericardial effusion though small amount of fluid is seen with the pericardial recess. Hepatobiliary: Normal hepatic contour. There is a subcentimeter hypoattenuating lesion with the anterior segment of the right lobe of the liver (image 25, series 2) which is too small to accurately characterize though favored to represent a hepatic cyst. Post cholecystectomy. No intra or extrahepatic biliary ductal dilatation. No ascites. Pancreas: Normal appearance of the pancreas. Spleen: Normal appearance of the spleen. Adrenals/Urinary Tract: Redemonstrated high density nonenhancing filling defects, presumably blood clot, within the right renal collecting system, slightly progressed compared to the 07/12/2022 examination again, the etiology of which is not depicted on this examination. Mild right-sided pelvicaliectasis without asymmetric perinephric stranding. There is preserved symmetric enhancement of the bilateral renal parenchyma. Unchanged approximately 1.8 cm hypoattenuating nonenhancing cyst within the interpolar aspect of the left kidney (coronal image 71, series 5). Additional bilateral subcentimeter hypoattenuating renal lesions are too small to accurately characterize though  favored to represent additional renal cysts. There is mild thickening of the bilateral adrenal glands without discrete nodule. Normal appearance of the urinary bladder given underdistention. Stomach/Bowel: Ingested enteric contrast extends to the level of the distal small bowel. No evidence of enteric obstruction. Previously questioned focal outpouching involving degraded curvature of the stomach is again not demonstrated on the present examination. There is persistent elevation/eventration of the left hemidiaphragm. No hiatal hernia. Normal appearance of the terminal ileum and the retrocecal appendix. No pneumoperitoneum, pneumatosis or portal venous gas. Vascular/Lymphatic: Redemonstrated aneurysmal dilatation of the infrarenal abdominal aorta measuring 36 mm in diameter (image 52, series 2). 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 There is a large amount of slightly irregular mixed calcified  and noncalcified atherosclerotic plaque throughout the abdominal aorta, not resulting in hemodynamically significant stenosis. Large amount of irregular mixed calcified and noncalcified atherosclerotic plaque throughout the left common iliac artery, the bilateral external iliac arteries as well as the imaged portions of the bilateral superficial femoral arteries likely resulting in tandem areas of hemodynamically significant narrowing, incompletely evaluated on this non CTA examination. No bulky retroperitoneal, mesenteric, pelvic or inguinal lymphadenopathy. Reproductive: Normal appearance of the pelvic organs for age. No free fluid the pelvic cul-de-sac. Other: Tiny mesenteric fat containing periumbilical hernia. Right-sided gluteal calcifications. Musculoskeletal: No acute or aggressive osseous abnormalities. Moderate to severe multilevel lumbar spine DDD, likely worse at L5-S1 with disc space height loss, endplate irregularity and posteriorly directed disc osteophyte complex  at this location. IMPRESSION: 1. Redemonstrated high-density nonenhancing filling defect throughout the right renal collecting system and renal pelvis, unchanged to slightly progressed compared to the 07/12/2022 examination and while indeterminate is favored to represent blood clot, the etiology of which is again not depicted on the present examination. Further evaluation with ureteroscopy could be performed as indicated. 2. Mild right-sided pelvicaliectasis without asymmetrically delayed renal enhancement. 3. 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 Abdominal Aortic Aneurysm (ICD10-I71.9). Recommend follow-up aortic ultrasound in 2 years. This recommendation follows ACR consensus guidelines: White Paper of the ACR Incidental Findings Committee II on Vascular Findings. J Am Coll Radiol 2013; 10:789-794. 4. Suspected hemodynamically significant narrowings involving left common iliac artery as well as the bilateral external iliac and imaged portions of the bilateral superficial femoral arteries. Correlation for symptoms of PAD is advised. Aortic Atherosclerosis (ICD10-I70.0). 5. Previously questioned masslike area involving the greater curvature of the stomach is again not demonstrated on the present examination and thus may be artifactual due to associated diaphragmatic hernia/eventration. Electronically Signed   By: Sandi Mariscal M.D.   On: 07/15/2022 16:40        Scheduled Meds:  carvedilol  25 mg Oral BID WC   cefadroxil  500 mg Oral BID   feeding supplement  237 mL Oral BID BM   Continuous Infusions:     LOS: 3 days    Time spent: 35 minutes    Barb Merino, MD [facility] Pager [phone]

## 2022-07-16 NOTE — Progress Notes (Addendum)
[facility] ROUNDING NOTE   Subjective: No acute events overnight.  CT repeated yesterday with results as below.  H/H stable.  INR now down to 1.7.  Denies any melena, hematochezia.  Still with hematuria.  Son present in room today.  Did have breakfast.   Objective: Vital signs in last 24 hours: Temp:  [98.7 F (37.1 C)-99.9 F (37.7 C)] 99.8 F (37.7 C) (10/01 0700) Pulse Rate:  [88-102] 88 (09/30 2159) Resp:  [16] 16 (09/30 1300) BP: (122-141)/(79-89) 130/79 (10/01 0700) SpO2:  [98 %-99 %] 99 % (10/01 0700) Weight:  [92 kg] 92 kg (10/01 0500) Last BM Date : 07/10/22 General: NAD Lungs:  CTA b/l, no w/r/r Heart:  RRR, no m/r/g Abdomen: Mild TTP in lower abdomen.  No guarding or peritoneal signs.  Soft, ND, +BS   Intake/Output from previous day: 09/30 0701 - 10/01 0700 In: 477 [P.O.:477] Out: 500 [Urine:500] Intake/Output this shift: Total I/O In: -  Out: 400 [Urine:400]   Lab Results: Recent Labs    07/14/22 0444 07/15/22 0523 07/16/22 0511  WBC  --  15.8* 14.2*  HGB 9.6* 8.8* 8.5*  PLT  --  253 228  MCV  --  87.7 85.7   BMET No results for input(s): "NA", "K", "CL", "CO2", "GLUCOSE", "BUN", "CREATININE", "CALCIUM" in the last 72 hours. LFT No results for input(s): "PROT", "ALBUMIN", "AST", "ALT", "ALKPHOS", "BILITOT", "BILIDIR", "IBILI" in the last 72 hours. PT/INR Recent Labs    07/15/22 0523 07/16/22 0511  INR 2.6* 1.7*      Imaging/Other results: CT ABDOMEN PELVIS W CONTRAST  Result Date: 07/15/2022 CLINICAL DATA:  Follow-up right renal hematoma/lesion. EXAM: CT ABDOMEN AND PELVIS WITH CONTRAST TECHNIQUE: Multidetector CT imaging of the abdomen and pelvis was performed using the standard protocol following bolus administration of intravenous contrast. RADIATION DOSE REDUCTION: This exam was performed according to the departmental dose-optimization program which includes automated exposure control, adjustment of the mA and/or kV  according to patient size and/or use of iterative reconstruction technique. CONTRAST:  132m OMNIPAQUE IOHEXOL 300 MG/ML  SOLN COMPARISON:  07/12/2022; 07/10/2022 FINDINGS: Lower chest: Limited visualization of the lower thorax demonstrates minimal bibasilar subsegmental atelectasis, right greater than left. Note is again made of a approximately 3.7 x 2.2 cm bulla/cyst within the imaged left lower lobe (image 47, series 4). Normal heart size. Coronary artery calcifications. Calcifications within the aortic valve leaflets. No pericardial effusion though small amount of fluid is seen with the pericardial recess. Hepatobiliary: Normal hepatic contour. There is a subcentimeter hypoattenuating lesion with the anterior segment of the right lobe of the liver (image 25, series 2) which is too small to accurately characterize though favored to represent a hepatic cyst. Post cholecystectomy. No intra or extrahepatic biliary ductal dilatation. No ascites. Pancreas: Normal appearance of the pancreas. Spleen: Normal appearance of the spleen. Adrenals/Urinary Tract: Redemonstrated high density nonenhancing filling defects, presumably blood clot, within the right renal collecting system, slightly progressed compared to the 07/12/2022 examination again, the etiology of which is not depicted on this examination. Mild right-sided pelvicaliectasis without asymmetric perinephric stranding. There is preserved symmetric enhancement of the bilateral renal parenchyma. Unchanged approximately 1.8 cm hypoattenuating nonenhancing cyst within the interpolar aspect of the left kidney (coronal image 71, series 5). Additional bilateral subcentimeter hypoattenuating renal lesions are too small to accurately characterize though favored to represent additional renal cysts. There is mild thickening of the bilateral adrenal glands without discrete nodule. Normal appearance of the urinary bladder given underdistention. Stomach/Bowel:  Ingested enteric  contrast extends to the level of the distal small bowel. No evidence of enteric obstruction. Previously questioned focal outpouching involving degraded curvature of the stomach is again not demonstrated on the present examination. There is persistent elevation/eventration of the left hemidiaphragm. No hiatal hernia. Normal appearance of the terminal ileum and the retrocecal appendix. No pneumoperitoneum, pneumatosis or portal venous gas. Vascular/Lymphatic: Redemonstrated aneurysmal dilatation of the infrarenal abdominal aorta measuring 36 mm in diameter (image 52, series 2). 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 There is a large amount of slightly irregular mixed calcified and noncalcified atherosclerotic plaque throughout the abdominal aorta, not resulting in hemodynamically significant stenosis. Large amount of irregular mixed calcified and noncalcified atherosclerotic plaque throughout the left common iliac artery, the bilateral external iliac arteries as well as the imaged portions of the bilateral superficial femoral arteries likely resulting in tandem areas of hemodynamically significant narrowing, incompletely evaluated on this non CTA examination. No bulky retroperitoneal, mesenteric, pelvic or inguinal lymphadenopathy. Reproductive: Normal appearance of the pelvic organs for age. No free fluid the pelvic cul-de-sac. Other: Tiny mesenteric fat containing periumbilical hernia. Right-sided gluteal calcifications. Musculoskeletal: No acute or aggressive osseous abnormalities. Moderate to severe multilevel lumbar spine DDD, likely worse at L5-S1 with disc space height loss, endplate irregularity and posteriorly directed disc osteophyte complex at this location. IMPRESSION: 1. Redemonstrated high-density nonenhancing filling defect throughout the right renal collecting system and renal pelvis, unchanged to slightly progressed compared to the 07/12/2022 examination  and while indeterminate is favored to represent blood clot, the etiology of which is again not depicted on the present examination. Further evaluation with ureteroscopy could be performed as indicated. 2. Mild right-sided pelvicaliectasis without asymmetrically delayed renal enhancement. 3. 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 Abdominal Aortic Aneurysm (ICD10-I71.9). Recommend follow-up aortic ultrasound in 2 years. This recommendation follows ACR consensus guidelines: White Paper of the ACR Incidental Findings Committee II on Vascular Findings. J Am Coll Radiol 2013; 10:789-794. 4. Suspected hemodynamically significant narrowings involving left common iliac artery as well as the bilateral external iliac and imaged portions of the bilateral superficial femoral arteries. Correlation for symptoms of PAD is advised. Aortic Atherosclerosis (ICD10-I70.0). 5. Previously questioned masslike area involving the greater curvature of the stomach is again not demonstrated on the present examination and thus may be artifactual due to associated diaphragmatic hernia/eventration. Electronically Signed   By: Sandi Mariscal M.D.   On: 07/15/2022 16:40      Assessment and Plan:  1) Gastric wall thickening 2) Abdominal pain Initial CT renal protocol on 9/25 with masslike area along the greater curve of the fundus measuring 2.9 x 3.9 x 3.7 cm.  Subsequent CT on 9/27 with mild proximal gastric wall thickening but no mass appreciated.  Repeat CT on 9/30 without any concern for mass. - EGD tomorrow with Dr. Fuller Plan now that INR <2 - I did explain to the patient that the source of her bleeding is the right kidney, and EGD tomorrow is more for diagnostic intent and not treatment of bleeding - N.p.o. 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  INR 1.7 today - Plan for procedure tomorrow  The indications, risks, and benefits of EGD  were explained to the patient in detail. Risks include but are not limited to bleeding, perforation, adverse reaction to medications, and cardiopulmonary compromise. Sequelae include but are not limited to the possibility of surgery, hospitalization, and mortality. The patient verbalized understanding and wished to proceed.     Lavena Bullion, DO  07/16/2022, 10:37 AM [facility] Pager [phone]

## 2022-07-16 NOTE — H&P (View-Only) (Signed)
[facility] ROUNDING NOTE   Subjective: No acute events overnight.  CT repeated yesterday with results as below.  H/H stable.  INR now down to 1.7.  Denies any melena, hematochezia.  Still with hematuria.  Son present in room today.  Did have breakfast.   Objective: Vital signs in last 24 hours: Temp:  [98.7 F (37.1 C)-99.9 F (37.7 C)] 99.8 F (37.7 C) (10/01 0700) Pulse Rate:  [88-102] 88 (09/30 2159) Resp:  [16] 16 (09/30 1300) BP: (122-141)/(79-89) 130/79 (10/01 0700) SpO2:  [98 %-99 %] 99 % (10/01 0700) Weight:  [92 kg] 92 kg (10/01 0500) Last BM Date : 07/10/22 General: NAD Lungs:  CTA b/l, no w/r/r Heart:  RRR, no m/r/g Abdomen: Mild TTP in lower abdomen.  No guarding or peritoneal signs.  Soft, ND, +BS   Intake/Output from previous day: 09/30 0701 - 10/01 0700 In: 477 [P.O.:477] Out: 500 [Urine:500] Intake/Output this shift: Total I/O In: -  Out: 400 [Urine:400]   Lab Results: Recent Labs    07/14/22 0444 07/15/22 0523 07/16/22 0511  WBC  --  15.8* 14.2*  HGB 9.6* 8.8* 8.5*  PLT  --  253 228  MCV  --  87.7 85.7   BMET No results for input(s): "NA", "K", "CL", "CO2", "GLUCOSE", "BUN", "CREATININE", "CALCIUM" in the last 72 hours. LFT No results for input(s): "PROT", "ALBUMIN", "AST", "ALT", "ALKPHOS", "BILITOT", "BILIDIR", "IBILI" in the last 72 hours. PT/INR Recent Labs    07/15/22 0523 07/16/22 0511  INR 2.6* 1.7*      Imaging/Other results: CT ABDOMEN PELVIS W CONTRAST  Result Date: 07/15/2022 CLINICAL DATA:  Follow-up right renal hematoma/lesion. EXAM: CT ABDOMEN AND PELVIS WITH CONTRAST TECHNIQUE: Multidetector CT imaging of the abdomen and pelvis was performed using the standard protocol following bolus administration of intravenous contrast. RADIATION DOSE REDUCTION: This exam was performed according to the departmental dose-optimization program which includes automated exposure control, adjustment of the mA and/or kV  according to patient size and/or use of iterative reconstruction technique. CONTRAST:  187m OMNIPAQUE IOHEXOL 300 MG/ML  SOLN COMPARISON:  07/12/2022; 07/10/2022 FINDINGS: Lower chest: Limited visualization of the lower thorax demonstrates minimal bibasilar subsegmental atelectasis, right greater than left. Note is again made of a approximately 3.7 x 2.2 cm bulla/cyst within the imaged left lower lobe (image 47, series 4). Normal heart size. Coronary artery calcifications. Calcifications within the aortic valve leaflets. No pericardial effusion though small amount of fluid is seen with the pericardial recess. Hepatobiliary: Normal hepatic contour. There is a subcentimeter hypoattenuating lesion with the anterior segment of the right lobe of the liver (image 25, series 2) which is too small to accurately characterize though favored to represent a hepatic cyst. Post cholecystectomy. No intra or extrahepatic biliary ductal dilatation. No ascites. Pancreas: Normal appearance of the pancreas. Spleen: Normal appearance of the spleen. Adrenals/Urinary Tract: Redemonstrated high density nonenhancing filling defects, presumably blood clot, within the right renal collecting system, slightly progressed compared to the 07/12/2022 examination again, the etiology of which is not depicted on this examination. Mild right-sided pelvicaliectasis without asymmetric perinephric stranding. There is preserved symmetric enhancement of the bilateral renal parenchyma. Unchanged approximately 1.8 cm hypoattenuating nonenhancing cyst within the interpolar aspect of the left kidney (coronal image 71, series 5). Additional bilateral subcentimeter hypoattenuating renal lesions are too small to accurately characterize though favored to represent additional renal cysts. There is mild thickening of the bilateral adrenal glands without discrete nodule. Normal appearance of the urinary bladder given underdistention. Stomach/Bowel:  Ingested enteric  contrast extends to the level of the distal small bowel. No evidence of enteric obstruction. Previously questioned focal outpouching involving degraded curvature of the stomach is again not demonstrated on the present examination. There is persistent elevation/eventration of the left hemidiaphragm. No hiatal hernia. Normal appearance of the terminal ileum and the retrocecal appendix. No pneumoperitoneum, pneumatosis or portal venous gas. Vascular/Lymphatic: Redemonstrated aneurysmal dilatation of the infrarenal abdominal aorta measuring 36 mm in diameter (image 52, series 2). 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 There is a large amount of slightly irregular mixed calcified and noncalcified atherosclerotic plaque throughout the abdominal aorta, not resulting in hemodynamically significant stenosis. Large amount of irregular mixed calcified and noncalcified atherosclerotic plaque throughout the left common iliac artery, the bilateral external iliac arteries as well as the imaged portions of the bilateral superficial femoral arteries likely resulting in tandem areas of hemodynamically significant narrowing, incompletely evaluated on this non CTA examination. No bulky retroperitoneal, mesenteric, pelvic or inguinal lymphadenopathy. Reproductive: Normal appearance of the pelvic organs for age. No free fluid the pelvic cul-de-sac. Other: Tiny mesenteric fat containing periumbilical hernia. Right-sided gluteal calcifications. Musculoskeletal: No acute or aggressive osseous abnormalities. Moderate to severe multilevel lumbar spine DDD, likely worse at L5-S1 with disc space height loss, endplate irregularity and posteriorly directed disc osteophyte complex at this location. IMPRESSION: 1. Redemonstrated high-density nonenhancing filling defect throughout the right renal collecting system and renal pelvis, unchanged to slightly progressed compared to the 07/12/2022 examination  and while indeterminate is favored to represent blood clot, the etiology of which is again not depicted on the present examination. Further evaluation with ureteroscopy could be performed as indicated. 2. Mild right-sided pelvicaliectasis without asymmetrically delayed renal enhancement. 3. 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 Abdominal Aortic Aneurysm (ICD10-I71.9). Recommend follow-up aortic ultrasound in 2 years. This recommendation follows ACR consensus guidelines: White Paper of the ACR Incidental Findings Committee II on Vascular Findings. J Am Coll Radiol 2013; 10:789-794. 4. Suspected hemodynamically significant narrowings involving left common iliac artery as well as the bilateral external iliac and imaged portions of the bilateral superficial femoral arteries. Correlation for symptoms of PAD is advised. Aortic Atherosclerosis (ICD10-I70.0). 5. Previously questioned masslike area involving the greater curvature of the stomach is again not demonstrated on the present examination and thus may be artifactual due to associated diaphragmatic hernia/eventration. Electronically Signed   By: Sandi Mariscal M.D.   On: 07/15/2022 16:40      Assessment and Plan:  1) Gastric wall thickening 2) Abdominal pain Initial CT renal protocol on 9/25 with masslike area along the greater curve of the fundus measuring 2.9 x 3.9 x 3.7 cm.  Subsequent CT on 9/27 with mild proximal gastric wall thickening but no mass appreciated.  Repeat CT on 9/30 without any concern for mass. - EGD tomorrow with Dr. Fuller Plan now that INR <2 - I did explain to the patient that the source of her bleeding is the right kidney, and EGD tomorrow is more for diagnostic intent and not treatment of bleeding - N.p.o. 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  INR 1.7 today - Plan for procedure tomorrow  The indications, risks, and benefits of EGD  were explained to the patient in detail. Risks include but are not limited to bleeding, perforation, adverse reaction to medications, and cardiopulmonary compromise. Sequelae include but are not limited to the possibility of surgery, hospitalization, and mortality. The patient verbalized understanding and wished to proceed.     Lavena Bullion, DO  07/16/2022, 10:37 AM [facility] Pager [phone]

## 2022-07-16 NOTE — Progress Notes (Signed)
PT Cancellation Note  Patient Details Name: Kristi Meyers MRN: [id number] DOB: 10-Jul-1956   Cancelled Treatment:    Reason Eval/Treat Not Completed: Patient declined, no reason specified Pt refused any mobility, reports she was up to chair with nursing staff  (+2 assist per RN)  Kenyon Ana 07/16/2022, 5:24 PM

## 2022-07-16 NOTE — Progress Notes (Signed)
OT Cancellation Note  Patient Details Name: Kristi Meyers MRN: [id number] DOB: [birth year]   Cancelled Treatment:    Reason Eval/Treat Not Completed: Other (comment) Pt just back to bed with nursing.  2 person heavy A. Will perform OT eval next day Kari Baars, [place] [phone] Office- [phone], Thereasa Parkin 07/16/2022, 4:44 PM

## 2022-07-17 ENCOUNTER — Inpatient Hospital Stay (HOSPITAL_COMMUNITY): Payer: Medicare HMO | Admitting: Anesthesiology

## 2022-07-17 ENCOUNTER — Encounter (HOSPITAL_COMMUNITY): Payer: Self-pay | Admitting: Internal Medicine

## 2022-07-17 ENCOUNTER — Encounter (HOSPITAL_COMMUNITY)
Admission: EM | Disposition: A | Discharge status: Home Health | Payer: Self-pay | Source: Home / Self Care | Attending: Internal Medicine

## 2022-07-17 DIAGNOSIS — K2950 Unspecified chronic gastritis without bleeding: Secondary | ICD-10-CM | POA: Diagnosis not present

## 2022-07-17 DIAGNOSIS — K2289 Other specified disease of esophagus: Secondary | ICD-10-CM

## 2022-07-17 DIAGNOSIS — Z87891 Personal history of nicotine dependence: Secondary | ICD-10-CM

## 2022-07-17 DIAGNOSIS — K2970 Gastritis, unspecified, without bleeding: Secondary | ICD-10-CM

## 2022-07-17 DIAGNOSIS — D649 Anemia, unspecified: Secondary | ICD-10-CM | POA: Diagnosis not present

## 2022-07-17 DIAGNOSIS — Q399 Congenital malformation of esophagus, unspecified: Secondary | ICD-10-CM | POA: Diagnosis not present

## 2022-07-17 DIAGNOSIS — R310 Gross hematuria: Secondary | ICD-10-CM | POA: Diagnosis not present

## 2022-07-17 DIAGNOSIS — I10 Essential (primary) hypertension: Secondary | ICD-10-CM | POA: Diagnosis not present

## 2022-07-17 DIAGNOSIS — K3189 Other diseases of stomach and duodenum: Secondary | ICD-10-CM | POA: Diagnosis not present

## 2022-07-17 HISTORY — PX: ESOPHAGOGASTRODUODENOSCOPY: SHX5428

## 2022-07-17 HISTORY — PX: UPPER ESOPHAGEAL ENDOSCOPIC ULTRASOUND (EUS): SHX6562

## 2022-07-17 HISTORY — PX: BIOPSY: SHX5522

## 2022-07-17 LAB — PROTIME-INR
INR: 1.4 — ABNORMAL HIGH (ref 0.8–1.2)
Prothrombin Time: 16.6 seconds — ABNORMAL HIGH (ref 11.4–15.2)

## 2022-07-17 LAB — CBC WITH DIFFERENTIAL/PLATELET
Abs Immature Granulocytes: 0.06 10*3/uL (ref 0.00–0.07)
Basophils Absolute: 0.1 10*3/uL (ref 0.0–0.1)
Basophils Relative: 1 %
Eosinophils Absolute: 0.2 10*3/uL (ref 0.0–0.5)
Eosinophils Relative: 1 %
HCT: 28.7 % — ABNORMAL LOW (ref 36.0–46.0)
Hemoglobin: 8.9 g/dL — ABNORMAL LOW (ref 12.0–15.0)
Immature Granulocytes: 1 %
Lymphocytes Relative: 18 %
Lymphs Abs: 2.4 10*3/uL (ref 0.7–4.0)
MCH: 26.9 pg (ref 26.0–34.0)
MCHC: 31 g/dL (ref 30.0–36.0)
MCV: 86.7 fL (ref 80.0–100.0)
Monocytes Absolute: 1.6 10*3/uL — ABNORMAL HIGH (ref 0.1–1.0)
Monocytes Relative: 13 %
Neutro Abs: 8.8 10*3/uL — ABNORMAL HIGH (ref 1.7–7.7)
Neutrophils Relative %: 66 %
Platelets: 233 10*3/uL (ref 150–400)
RBC: 3.31 MIL/uL — ABNORMAL LOW (ref 3.87–5.11)
RDW: 15.9 % — ABNORMAL HIGH (ref 11.5–15.5)
WBC: 13.1 10*3/uL — ABNORMAL HIGH (ref 4.0–10.5)
nRBC: 0 % (ref 0.0–0.2)

## 2022-07-17 SURGERY — EGD (ESOPHAGOGASTRODUODENOSCOPY)
Anesthesia: Monitor Anesthesia Care

## 2022-07-17 MED ORDER — ONDANSETRON HCL 4 MG/2ML IJ SOLN
4.0000 mg | Freq: Three times a day (TID) | INTRAMUSCULAR | Status: DC | PRN
  Administered 2022-07-17: 4 mg via INTRAVENOUS
  Filled 2022-07-17: qty 2

## 2022-07-17 MED ORDER — PHENYLEPHRINE HCL (PRESSORS) 10 MG/ML IV SOLN
INTRAVENOUS | Status: DC | PRN
  Administered 2022-07-17: 120 ug via INTRAVENOUS
  Administered 2022-07-17: 80 ug via INTRAVENOUS
  Administered 2022-07-17 (×2): 100 ug via INTRAVENOUS
  Administered 2022-07-17: 80 ug via INTRAVENOUS

## 2022-07-17 MED ORDER — PHENYLEPHRINE HCL-NACL 20-0.9 MG/250ML-% IV SOLN
INTRAVENOUS | Status: DC | PRN
  Administered 2022-07-17: 50 ug/min via INTRAVENOUS

## 2022-07-17 MED ORDER — LIDOCAINE HCL 1 % IJ SOLN
INTRAMUSCULAR | Status: DC | PRN
  Administered 2022-07-17: 50 mg via INTRADERMAL

## 2022-07-17 MED ORDER — ALBUMIN HUMAN 5 % IV SOLN
INTRAVENOUS | Status: AC
  Filled 2022-07-17: qty 250

## 2022-07-17 MED ORDER — LACTATED RINGERS IV SOLN: INTRAVENOUS | Status: DC | PRN

## 2022-07-17 MED ORDER — WARFARIN SODIUM 5 MG PO TABS
5.0000 mg | ORAL_TABLET | Freq: Once | ORAL | Status: AC
  Administered 2022-07-17: 5 mg via ORAL
  Filled 2022-07-17: qty 1

## 2022-07-17 MED ORDER — WARFARIN - PHARMACIST DOSING INPATIENT: Freq: Every day | Status: DC

## 2022-07-17 MED ORDER — PANTOPRAZOLE SODIUM 40 MG PO TBEC
40.0000 mg | DELAYED_RELEASE_TABLET | Freq: Every day | ORAL | Status: DC
  Administered 2022-07-17 – 2022-07-18 (×2): 40 mg via ORAL
  Filled 2022-07-17 (×2): qty 1

## 2022-07-17 MED ORDER — SODIUM CHLORIDE 0.9 % IV SOLN: INTRAVENOUS | Status: DC

## 2022-07-17 MED ORDER — EPHEDRINE SULFATE (PRESSORS) 50 MG/ML IJ SOLN
INTRAMUSCULAR | Status: DC | PRN
  Administered 2022-07-17: 10 mg via INTRAVENOUS
  Administered 2022-07-17: 5 mg via INTRAVENOUS

## 2022-07-17 MED ORDER — ALBUMIN HUMAN 5 % IV SOLN: INTRAVENOUS | Status: DC | PRN

## 2022-07-17 MED ORDER — PROPOFOL 500 MG/50ML IV EMUL
INTRAVENOUS | Status: DC | PRN
  Administered 2022-07-17: 120 ug/kg/min via INTRAVENOUS

## 2022-07-17 NOTE — Interval H&P Note (Signed)
History and Physical Interval Note:  07/17/2022 11:49 AM  Kristi Meyers  has presented today for surgery, with the diagnosis of Abnormal CT scan, abdominal pain.  The various methods of treatment have been discussed with the patient and family. After consideration of risks, benefits and other options for treatment, the patient has consented to  Procedure(s): ESOPHAGOGASTRODUODENOSCOPY (EGD) (Left) UPPER ESOPHAGEAL ENDOSCOPIC ULTRASOUND (EUS) (N/A) as a surgical intervention.  The patient's history has been reviewed, patient examined, no change in status, stable for surgery.  I have reviewed the patient's chart and labs.  Questions were answered to the patient's satisfaction.     The risks of an EUS including intestinal perforation, bleeding, infection, aspiration, and medication effects were discussed as was the possibility it may not give a definitive diagnosis if a biopsy is performed.  When a biopsy of the pancreas is done as part of the EUS, there is an additional risk of pancreatitis at the rate of about 1-2%.  It was explained that procedure related pancreatitis is typically mild, although it can be severe and even life threatening, which is why we do not perform random pancreatic biopsies and only biopsy a lesion/area we feel is concerning enough to warrant the risk.   [employer]

## 2022-07-17 NOTE — Care Management Important Message (Signed)
Important Message  Patient Details IM Letter placed in Patients room. Name: Kristi Meyers MRN: [id number] Date of Birth: [birth year]   Medicare Important Message Given:  Yes     Kerin Salen 07/17/2022, 12:15 PM

## 2022-07-17 NOTE — Transfer of Care (Signed)
Immediate Anesthesia Transfer of Care Note  Patient: Kristi Meyers  Procedure(s) Performed: ESOPHAGOGASTRODUODENOSCOPY (EGD) (Left) UPPER ESOPHAGEAL ENDOSCOPIC ULTRASOUND (EUS) BIOPSY  Patient Location: PACU and Endoscopy Unit  Anesthesia Type:MAC  Level of Consciousness: oriented, drowsy and patient cooperative  Airway & Oxygen Therapy: Patient Spontanous Breathing and Patient connected to face mask oxygen  Post-op Assessment: Report given to RN and Post -op Vital signs reviewed and stable  Post vital signs: Reviewed and stable  Last Vitals:  Vitals Value Taken Time  BP 101/67 07/17/22 1300  Temp    Pulse 80 07/17/22 1304  Resp 11 07/17/22 1304  SpO2 100 % 07/17/22 1304  Vitals shown include unvalidated device data.  Last Pain:  Vitals:   07/17/22 1129  TempSrc: Temporal  PainSc: 0-No pain      Patients Stated Pain Goal: 4 (24/09/73 5329)  Complications: No notable events documented.

## 2022-07-17 NOTE — Progress Notes (Signed)
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  She also noticed blood in her urine for 3 days.   In the emergency room afebrile.  96% on room air.  Hemoglobin 11.2, repeat 9.9.  INR 5.1.    Assessment & Plan:   Gross hematuria in a patient with supratherapeutic INR: Abdominal and flank pain. -Hemoglobin remained stable and hematuria resolved with conservative management and stopping Coumadin. -Followed by urology, anticipating conservative management.  They would like to schedule outpatient follow-up. -CT urogram with bleeding/hematoma right pelvicalyceal system likely source of bleeding. -Coumadin was discontinued, INR is 1.4 today.  Hematuria has resolved. -UTI present on admission, growing pansensitive E. coli.  On Rocephin.  Changed to Keflex.  Will treat for total 7 days. -Patient complains of ongoing pain not relieved and needing IV opiates, repeat CT scan shows persistent or increasing hematoma right pelvicalyceal system.   -Discussed with urology with beneficial to do procedure while patient in the hospital, they recommended not to do any procedure with hematoma.  Vaginal bleeding: With history of fibroids and endometrial polyp status post polypectomy.  More likely bleeding from urethra.  Abnormal thickening of gastric fundus: Screening upper GI endoscopy today.   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  1.4 today.  Continue monitoring.  Last thromboembolism event in 2019, can hold anticoagulation.  She also has IVC filter placed. Since urology is not planning any procedure, will resume  Coumadin tonight after upper GI procedure.  Essential hypertension: Resumed beta-blockers.  Stable.  Anemia of acute blood loss: Hemoglobin drifted down from 11-8.8.  Currently not actively bleeding.    DVT prophylaxis: Place and maintain sequential compression device Start: 07/11/22 0741 SCDs Start: 07/11/22 0416   Code Status: Full code Family Communication: None today. Disposition Plan: Status is: Inpatient.  Inpatient procedures planned.     Consultants:  Urology Gastroenterology  Procedures:  None  Antimicrobials:  Rocephin 9/26   Subjective:  Patient seen and examined.  Still has occasional intermittent right flank pain but better than before.  Urine is clear.  Anxious about endoscopy today.  Objective: Vitals:   07/16/22 2219 07/17/22 0500 07/17/22 0639 07/17/22 1129  BP: 126/78  107/74 122/79  Pulse: 81  79 91  Resp: '18  16 15  '$ Temp: 98.6 F (37 C)  99.1 F (37.3 C) 98 F (36.7 C)  TempSrc: Oral  Oral Temporal  SpO2: 96%  97% 93%  Weight:  90 kg    Height:        Intake/Output Summary (Last 24 hours) at 07/17/2022 1208 Last data filed at 07/17/2022 0757 Gross per 24 hour  Intake 240 ml  Output 1300 ml  Net -1060 ml    Filed Weights   07/15/22 0340 07/16/22 0500 07/17/22 0500  Weight: 93.2 kg 92 kg 90 kg    Examination:  General exam: Appears comfortable.  Slightly apprehensive on right flank exam. Respiratory system: Clear to auscultation. Respiratory effort normal. Cardiovascular system: S1 & S2 heard, RRR. No JVD, murmurs, rubs, gallops or clicks. No pedal edema. Gastrointestinal system: Abdomen is nondistended, soft and mild tenderness along the right  lateral quadrant and flank.  No rigidity or guarding. No organomegaly or masses felt. Normal bowel sounds heard. Central nervous system: Alert and oriented. No focal neurological deficits. Extremities: Symmetric 5 x 5 power.   Data Reviewed: I have personally reviewed following labs and  imaging studies  CBC: Recent Labs  Lab 07/10/22 1223 07/11/22 0600 07/11/22 0743 07/12/22 0937 07/12/22 1653 07/13/22 1649 07/14/22 0444 07/15/22 0523 07/16/22 0511 07/17/22 0423  WBC 8.9 8.5  --  9.9  --   --   --  15.8* 14.2* 13.1*  NEUTROABS 4.7 4.8  --   --   --   --   --  11.0* 9.7* 8.8*  HGB 11.2* 9.9*   < > 10.5*   < > 9.9* 9.6* 8.8* 8.5* 8.9*  HCT 36.4 31.2*   < > 33.1*   < > 31.6* 30.6* 28.4* 26.9* 28.7*  MCV 87.1 86.0  --  86.6  --   --   --  87.7 85.7 86.7  PLT 329 251  --  278  --   --   --  253 228 233   < > = values in this interval not displayed.    Basic Metabolic Panel: Recent Labs  Lab 07/10/22 1223 07/11/22 0600  NA 142 143  K 4.2 4.1  CL 110 112*  CO2 25 23  GLUCOSE 99 86  BUN 19 21  CREATININE 1.10* 0.95  CALCIUM 9.1 8.8*  MG  --  1.8    GFR: Estimated Creatinine Clearance: 61.6 mL/min (by C-G formula based on SCr of 0.95 mg/dL). Liver Function Tests: Recent Labs  Lab 07/10/22 1223 07/11/22 0600  AST 14* 15  ALT 14 15  ALKPHOS 67 65  BILITOT 0.8 0.9  PROT 7.4 6.7  ALBUMIN 3.5 3.2*    Recent Labs  Lab 07/10/22 1223  LIPASE 26    No results for input(s): "AMMONIA" in the last 168 hours. Coagulation Profile: Recent Labs  Lab 07/13/22 0424 07/14/22 0444 07/15/22 0523 07/16/22 0511 07/17/22 0423  INR 3.1* 3.7* 2.6* 1.7* 1.4*    Cardiac Enzymes: No results for input(s): "CKTOTAL", "CKMB", "CKMBINDEX", "TROPONINI" in the last 168 hours. BNP (last 3 results) No results for input(s): "PROBNP" in the last 8760 hours. HbA1C: No results for input(s): "HGBA1C" in the last 72 hours. CBG: No results for input(s): "GLUCAP" in the last 168 hours. Lipid Profile: No results for input(s): "CHOL", "HDL", "LDLCALC", "TRIG", "CHOLHDL", "LDLDIRECT" in the last 72 hours. Thyroid Function Tests: No results for input(s): "TSH", "T4TOTAL", "FREET4", "T3FREE", "THYROIDAB" in the last 72 hours. Anemia Panel: No results for input(s):  "VITAMINB12", "FOLATE", "FERRITIN", "TIBC", "IRON", "RETICCTPCT" in the last 72 hours. Sepsis Labs: No results for input(s): "PROCALCITON", "LATICACIDVEN" in the last 168 hours.  Recent Results (from the past 240 hour(s))  Urine Culture     Status: Abnormal   Collection Time: 07/11/22  1:00 AM   Specimen: In/Out Cath Urine  Result Value Ref Range Status   Specimen Description   Final    IN/OUT CATH URINE Performed at [facility] [address], [place], [place] [address]    Special Requests   Final    NONE Performed at [facility], [place] [address], [place], [place] [address]    Culture >=100,000 COLONIES/mL ESCHERICHIA COLI (A)  Final   Report Status 07/13/2022 FINAL  Final   Organism ID, Bacteria ESCHERICHIA COLI (A)  Final      Susceptibility  Escherichia coli - MIC*    AMPICILLIN <=2 SENSITIVE Sensitive     CEFAZOLIN <=4 SENSITIVE Sensitive     CEFEPIME <=0.12 SENSITIVE Sensitive     CEFTRIAXONE <=0.25 SENSITIVE Sensitive     CIPROFLOXACIN <=0.25 SENSITIVE Sensitive     GENTAMICIN <=1 SENSITIVE Sensitive     IMIPENEM <=0.25 SENSITIVE Sensitive     NITROFURANTOIN <=16 SENSITIVE Sensitive     TRIMETH/SULFA <=20 SENSITIVE Sensitive     AMPICILLIN/SULBACTAM <=2 SENSITIVE Sensitive     PIP/TAZO <=4 SENSITIVE Sensitive     * >=100,000 COLONIES/mL ESCHERICHIA COLI         Radiology Studies: CT ABDOMEN PELVIS W CONTRAST  Result Date: 07/15/2022 CLINICAL DATA:  Follow-up right renal hematoma/lesion. EXAM: CT ABDOMEN AND PELVIS WITH CONTRAST TECHNIQUE: Multidetector CT imaging of the abdomen and pelvis was performed using the standard protocol following bolus administration of intravenous contrast. RADIATION DOSE REDUCTION: This exam was performed according to the departmental dose-optimization program which includes automated exposure control, adjustment of the mA and/or kV according to patient size and/or use of iterative reconstruction  technique. CONTRAST:  128m OMNIPAQUE IOHEXOL 300 MG/ML  SOLN COMPARISON:  07/12/2022; 07/10/2022 FINDINGS: Lower chest: Limited visualization of the lower thorax demonstrates minimal bibasilar subsegmental atelectasis, right greater than left. Note is again made of a approximately 3.7 x 2.2 cm bulla/cyst within the imaged left lower lobe (image 47, series 4). Normal heart size. Coronary artery calcifications. Calcifications within the aortic valve leaflets. No pericardial effusion though small amount of fluid is seen with the pericardial recess. Hepatobiliary: Normal hepatic contour. There is a subcentimeter hypoattenuating lesion with the anterior segment of the right lobe of the liver (image 25, series 2) which is too small to accurately characterize though favored to represent a hepatic cyst. Post cholecystectomy. No intra or extrahepatic biliary ductal dilatation. No ascites. Pancreas: Normal appearance of the pancreas. Spleen: Normal appearance of the spleen. Adrenals/Urinary Tract: Redemonstrated high density nonenhancing filling defects, presumably blood clot, within the right renal collecting system, slightly progressed compared to the 07/12/2022 examination again, the etiology of which is not depicted on this examination. Mild right-sided pelvicaliectasis without asymmetric perinephric stranding. There is preserved symmetric enhancement of the bilateral renal parenchyma. Unchanged approximately 1.8 cm hypoattenuating nonenhancing cyst within the interpolar aspect of the left kidney (coronal image 71, series 5). Additional bilateral subcentimeter hypoattenuating renal lesions are too small to accurately characterize though favored to represent additional renal cysts. There is mild thickening of the bilateral adrenal glands without discrete nodule. Normal appearance of the urinary bladder given underdistention. Stomach/Bowel: Ingested enteric contrast extends to the level of the distal small bowel. No  evidence of enteric obstruction. Previously questioned focal outpouching involving degraded curvature of the stomach is again not demonstrated on the present examination. There is persistent elevation/eventration of the left hemidiaphragm. No hiatal hernia. Normal appearance of the terminal ileum and the retrocecal appendix. No pneumoperitoneum, pneumatosis or portal venous gas. Vascular/Lymphatic: Redemonstrated aneurysmal dilatation of the infrarenal abdominal aorta measuring 36 mm in diameter (image 52, series 2). 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 There is a large amount of slightly irregular mixed calcified and noncalcified atherosclerotic plaque throughout the abdominal aorta, not resulting in hemodynamically significant stenosis. Large amount of irregular mixed calcified and noncalcified atherosclerotic plaque throughout the left common iliac artery, the bilateral external iliac arteries as well as the imaged portions of the bilateral superficial femoral arteries likely resulting in tandem  areas of hemodynamically significant narrowing, incompletely evaluated on this non CTA examination. No bulky retroperitoneal, mesenteric, pelvic or inguinal lymphadenopathy. Reproductive: Normal appearance of the pelvic organs for age. No free fluid the pelvic cul-de-sac. Other: Tiny mesenteric fat containing periumbilical hernia. Right-sided gluteal calcifications. Musculoskeletal: No acute or aggressive osseous abnormalities. Moderate to severe multilevel lumbar spine DDD, likely worse at L5-S1 with disc space height loss, endplate irregularity and posteriorly directed disc osteophyte complex at this location. IMPRESSION: 1. Redemonstrated high-density nonenhancing filling defect throughout the right renal collecting system and renal pelvis, unchanged to slightly progressed compared to the 07/12/2022 examination and while indeterminate is favored to represent blood clot,  the etiology of which is again not depicted on the present examination. Further evaluation with ureteroscopy could be performed as indicated. 2. Mild right-sided pelvicaliectasis without asymmetrically delayed renal enhancement. 3. 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 Abdominal Aortic Aneurysm (ICD10-I71.9). Recommend follow-up aortic ultrasound in 2 years. This recommendation follows ACR consensus guidelines: White Paper of the ACR Incidental Findings Committee II on Vascular Findings. J Am Coll Radiol 2013; 10:789-794. 4. Suspected hemodynamically significant narrowings involving left common iliac artery as well as the bilateral external iliac and imaged portions of the bilateral superficial femoral arteries. Correlation for symptoms of PAD is advised. Aortic Atherosclerosis (ICD10-I70.0). 5. Previously questioned masslike area involving the greater curvature of the stomach is again not demonstrated on the present examination and thus may be artifactual due to associated diaphragmatic hernia/eventration. Electronically Signed   By: Sandi Mariscal M.D.   On: 07/15/2022 16:40        Scheduled Meds:  [MAR Hold] carvedilol  25 mg Oral BID WC   [MAR Hold] cefadroxil  500 mg Oral BID   [MAR Hold] feeding supplement  237 mL Oral BID BM   Continuous Infusions:  sodium chloride     sodium chloride        LOS: 4 days    Time spent: 35 minutes    Barb Merino, MD [facility] Pager [phone]

## 2022-07-17 NOTE — Anesthesia Preprocedure Evaluation (Signed)
Anesthesia Evaluation  Patient identified by MRN, date of birth, ID band Patient awake    Reviewed: Allergy & Precautions, NPO status , Patient's Chart, lab work & pertinent test results  Airway Mallampati: II  TM Distance: >3 FB Neck ROM: Full    Dental  (+) Dental Advisory Given   Pulmonary former smoker,    breath sounds clear to auscultation       Cardiovascular hypertension, Pt. on medications + DVT   Rhythm:Regular Rate:Normal     Neuro/Psych  Headaches,    GI/Hepatic Neg liver ROS, GERD  ,  Endo/Other  negative endocrine ROS  Renal/GU Renal InsufficiencyRenal disease     Musculoskeletal  (+) Arthritis ,   Abdominal   Peds  Hematology  (+) Blood dyscrasia, anemia ,   Anesthesia Other Findings   Reproductive/Obstetrics                             Anesthesia Physical Anesthesia Plan  ASA: 3  Anesthesia Plan: MAC   Post-op Pain Management:    Induction:   PONV Risk Score and Plan: 2 and Propofol infusion and Ondansetron  Airway Management Planned: Natural Airway and Nasal Cannula  Additional Equipment:   Intra-op Plan:   Post-operative Plan:   Informed Consent: I have reviewed the patients History and Physical, chart, labs and discussed the procedure including the risks, benefits and alternatives for the proposed anesthesia with the patient or authorized representative who has indicated his/her understanding and acceptance.       Plan Discussed with:   Anesthesia Plan Comments:         Anesthesia Quick Evaluation

## 2022-07-17 NOTE — Progress Notes (Signed)
OT Cancellation Note  Patient Details Name: MELONI HINZ MRN: [id number] DOB: 22-Apr-1956   Cancelled Treatment:    Reason Eval/Treat Not Completed: Patient at procedure or test/ unavailable. Pt just left the room for an upper GI.  [place], OTR/L Acute Re[facility] [phone] Office [phone]    Almon Register 07/17/2022, 11:23 AM

## 2022-07-17 NOTE — Progress Notes (Signed)
[place] for Warfarin Indication:  History of DVT and saddle PE  No Known Allergies  Patient Measurements: Height: '5\' 4"'$  (162.6 cm) Weight: 90 kg (198 lb 6.6 oz) IBW/kg (Calculated) : 54.7  Vital Signs: Temp: 98.7 F (37.1 C) (10/02 1401) Temp Source: Oral (10/02 1401) BP: 106/77 (10/02 1401) Pulse Rate: 87 (10/02 1401)  Labs: Recent Labs    07/15/22 0523 07/16/22 0511 07/17/22 0423  HGB 8.8* 8.5* 8.9*  HCT 28.4* 26.9* 28.7*  PLT 253 228 233  LABPROT 27.3* 20.0* 16.6*  INR 2.6* 1.7* 1.4*    Estimated Creatinine Clearance: 61.6 mL/min (by C-G formula based on SCr of 0.95 mg/dL).   Medical History: Past Medical History:  Diagnosis Date   Abnormal EKG    hx of left bundle branch block    Anginal pain (HCC)    stable   GERD (gastroesophageal reflux disease)    Heart murmur    soft systolic per dr Terrence Dupont 57-26-2035 lov   High cholesterol    Hypertension    Left leg DVT ([place]) 11/2017   pulmonary embolus   Migraine    PMB (postmenopausal bleeding)    Vertigo     Medications:  On warfarin prior to admission with current regimen of warfarin 4 mg daily except 2 mg on Sunday with plans to begin 5 mg daily when she finished the '4mg'$  tablets.  Assessment: Pharmacy consulted to dose warfarin for this 66 y.o. female with a medical history significant for left lower extremity DVT and saddle PE and who was admitted with gross hematuria and supratherapeutic INR on 9/25.  Warfarin was held since admission with resolution of hematuria and is to be continued this afternoon following EGD.    Drug Interactions with Warfarin (increased INR):  Cefadroxil Pantoprazole   Goal of Therapy:  Anticoagulation Monitor platelets by anticoagulation protocol: Yes   Plan:  Warfarin 5 mg po x 1 today Monitor daily PT/INR, CBC, signs/symptoms of bleeding    Thank you for allowing pharmacy to be a part of this patient's care.  Royetta Asal, PharmD, BCPS Clinical Pharmacist [place] Please utilize Amion for appropriate phone number to reach the unit pharmacist ([place]) 07/17/2022 3:01 PM

## 2022-07-17 NOTE — Progress Notes (Signed)
PT Cancellation Note  Patient Details Name: PAIGE MONARREZ MRN: [id number] DOB: [birth year]   Cancelled Treatment:    Reason Eval/Treat Not Completed: Patient at procedure or test/unavailable (pt @ endoscopy. will follow.)  Philomena Doheny PT 07/17/2022  Acute Rehabilitation Services  Office [phone]

## 2022-07-17 NOTE — Op Note (Signed)
[facility] Patient Name: Kristi Meyers Procedure Date: 07/17/2022 MRN: [id number] Attending MD: Justice Britain , MD Date of Birth: [birth year] CSN: [id number] Age: 66 Admit Type: Inpatient Procedure:                Upper EUS Indications:              Suspected mass in stomach on CT scan, Generalized                            abdominal pain Providers:                Justice Britain, MD, Carlyn Reichert, RN, Cletis Athens, Technician Referring MD:             Billie Ruddy, inpatient medical service Medicines:                Monitored Anesthesia Care Complications:            No immediate complications. Estimated Blood Loss:     Estimated blood loss was minimal. Procedure:                Pre-Anesthesia Assessment:                           - Prior to the procedure, a History and Physical                            was performed, and patient medications and                            allergies were reviewed. The patient's tolerance of                            previous anesthesia was also reviewed. The risks                            and benefits of the procedure and the sedation                            options and risks were discussed with the patient.                            All questions were answered, and informed consent                            was obtained. Prior Anticoagulants: The patient has                            taken Coumadin (warfarin), last dose was 5 days                            prior to procedure. ASA Grade Assessment: III - A  patient with severe systemic disease. After                            reviewing the risks and benefits, the patient was                            deemed in satisfactory condition to undergo the                            procedure.                           After obtaining informed consent, the endoscope was                            passed under direct  vision. Throughout the                            procedure, the patient's blood pressure, pulse, and                            oxygen saturations were monitored continuously. The                            [id number]-[id number] ([id number]) Olympus endoscope was introduced                            through the mouth, and advanced to the second part                            of duodenum. The upper EUS was accomplished without                            difficulty. The patient tolerated the procedure.                            The [id number]-[id number] ([id number]) Olympus radial                            ultrasound scope was introduced through the mouth,                            and advanced to the stomach for ultrasound                            examination. Scope In: Scope Out: Findings:      ENDOSCOPIC FINDING: :      No gross mucosal lesions were noted in the entire esophagus.      The distal esophagus was significantly tortuous.      The Z-line was irregular and was found 39 cm from the incisors.      Angulation and J-shaped deformities of the gastric body.      Patchy moderate mucosal changes characterized by congestion, erythema       and granularity were found in the entire examined stomach. Biopsies  were       taken with a cold forceps for histology and Helicobacter pylori testing.      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      No gross lesions were noted in the duodenal bulb, in the first portion       of the duodenum and in the second portion of the duodenum.      ENDOSONOGRAPHIC FINDING: :      Endosonographic imaging in the entire stomach showed no intramural       (subepithelial) lesion or mass.      Endosonographic imaging in the visualized portion of the liver showed no       mass. Impression:               EGD impression:                           - No gross mucosal lesions in esophagus. Tortuous                            esophagus distally. Z-line  irregular, 39 cm from                            the incisors.                           - J-shaped and angulation deformities of the                            gastric body.                           - Congested, erythematous and granular mucosa in                            the stomach. Biopsied.                           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                           - No gross lesions in the duodenal bulb, in the                            first portion of the duodenum and in the second                            portion of the duodenum.                           EUS impression:                           - No evidence of significant gastric wall  thickening or gastric mass/lesion noted on EUS. Due                            to patient's significant angulation and J-shaped                            deformities, this made visualization difficult but                            again no gross lesions seen today. Moderate Sedation:      Not Applicable - Patient had care per Anesthesia. Recommendation:           - The patient will be observed post-procedure,                            until all discharge criteria are met.                           - Return patient to hospital ward for ongoing care.                           - Observe patient's clinical course.                           - Await path results.                           - Initiate PPI 40 mg daily.                           - Observe patient's clinical course.                           - From a GI perspective there is no                            contraindication to restart of anticoagulation.                            Defer that ultimately to the medicine service.                           - The findings and recommendations were discussed                            with the patient.                           - The findings and  recommendations were discussed                            with the patient's family.                           - The findings and recommendations were discussed  with the referring physician. Procedure Code(s):        --- Professional ---                           [phone], Esophagogastroduodenoscopy, flexible,                            transoral; with endoscopic ultrasound examination                            limited to the esophagus, stomach or duodenum, and                            adjacent structures                           43239, Esophagogastroduodenoscopy, flexible,                            transoral; with biopsy, single or multiple Diagnosis Code(s):        --- Professional ---                           Q39.9, Congenital malformation of esophagus,                            unspecified                           K22.8, Other specified diseases of esophagus                           K31.89, Other diseases of stomach and duodenum                           I89.0, Lymphedema, not elsewhere classified                           R10.84, Generalized abdominal pain                           R93.3, Abnormal findings on diagnostic imaging of                            other parts of digestive tract CPT copyright 2019 American Medical Association. All rights reserved. The codes documented in this report are preliminary and upon coder review may  be revised to meet current compliance requirements. Justice Britain, MD 07/17/2022 12:57:24 PM Number of Addenda: 0

## 2022-07-18 ENCOUNTER — Inpatient Hospital Stay (HOSPITAL_COMMUNITY)
Admission: EM | Admit: 2022-07-18 | Discharge: 2022-07-22 | Disposition: A | Discharge status: Skilled Nursing Facility | Payer: Medicare HMO | Source: Home / Self Care | Attending: Internal Medicine | Admitting: Internal Medicine

## 2022-07-18 ENCOUNTER — Emergency Department (HOSPITAL_COMMUNITY): Payer: Medicare HMO

## 2022-07-18 ENCOUNTER — Other Ambulatory Visit: Payer: Self-pay

## 2022-07-18 ENCOUNTER — Encounter (HOSPITAL_COMMUNITY): Payer: Self-pay

## 2022-07-18 DIAGNOSIS — N179 Acute kidney failure, unspecified: Secondary | ICD-10-CM | POA: Diagnosis not present

## 2022-07-18 DIAGNOSIS — Z7901 Long term (current) use of anticoagulants: Secondary | ICD-10-CM

## 2022-07-18 DIAGNOSIS — W19XXXA Unspecified fall, initial encounter: Principal | ICD-10-CM

## 2022-07-18 DIAGNOSIS — I10 Essential (primary) hypertension: Secondary | ICD-10-CM | POA: Diagnosis present

## 2022-07-18 DIAGNOSIS — Z86711 Personal history of pulmonary embolism: Secondary | ICD-10-CM | POA: Diagnosis present

## 2022-07-18 DIAGNOSIS — R310 Gross hematuria: Secondary | ICD-10-CM | POA: Diagnosis not present

## 2022-07-18 DIAGNOSIS — D62 Acute posthemorrhagic anemia: Secondary | ICD-10-CM | POA: Diagnosis not present

## 2022-07-18 DIAGNOSIS — E785 Hyperlipidemia, unspecified: Secondary | ICD-10-CM | POA: Diagnosis present

## 2022-07-18 DIAGNOSIS — R262 Difficulty in walking, not elsewhere classified: Secondary | ICD-10-CM | POA: Diagnosis not present

## 2022-07-18 LAB — CBC WITH DIFFERENTIAL/PLATELET
Abs Immature Granulocytes: 0.08 10*3/uL — ABNORMAL HIGH (ref 0.00–0.07)
Basophils Absolute: 0.1 10*3/uL (ref 0.0–0.1)
Basophils Relative: 0 %
Eosinophils Absolute: 0 10*3/uL (ref 0.0–0.5)
Eosinophils Relative: 0 %
HCT: 29.2 % — ABNORMAL LOW (ref 36.0–46.0)
Hemoglobin: 9.1 g/dL — ABNORMAL LOW (ref 12.0–15.0)
Immature Granulocytes: 1 %
Lymphocytes Relative: 10 %
Lymphs Abs: 1.5 10*3/uL (ref 0.7–4.0)
MCH: 26.8 pg (ref 26.0–34.0)
MCHC: 31.2 g/dL (ref 30.0–36.0)
MCV: 86.1 fL (ref 80.0–100.0)
Monocytes Absolute: 1.5 10*3/uL — ABNORMAL HIGH (ref 0.1–1.0)
Monocytes Relative: 10 %
Neutro Abs: 11.6 10*3/uL — ABNORMAL HIGH (ref 1.7–7.7)
Neutrophils Relative %: 79 %
Platelets: 223 10*3/uL (ref 150–400)
RBC: 3.39 MIL/uL — ABNORMAL LOW (ref 3.87–5.11)
RDW: 15.9 % — ABNORMAL HIGH (ref 11.5–15.5)
WBC: 14.8 10*3/uL — ABNORMAL HIGH (ref 4.0–10.5)
nRBC: 0 % (ref 0.0–0.2)

## 2022-07-18 LAB — URINALYSIS, ROUTINE W REFLEX MICROSCOPIC
RBC / HPF: >50 RBC/hpf — ABNORMAL HIGH (ref 0–5)
WBC, UA: >50 WBC/hpf — ABNORMAL HIGH (ref 0–5)

## 2022-07-18 LAB — COMPREHENSIVE METABOLIC PANEL
ALT: 16 U/L (ref 0–44)
AST: 16 U/L (ref 15–41)
Albumin: 3.1 g/dL — ABNORMAL LOW (ref 3.5–5.0)
Alkaline Phosphatase: 61 U/L (ref 38–126)
Anion gap: 5 (ref 5–15)
BUN: 50 mg/dL — ABNORMAL HIGH (ref 8–23)
CO2: 24 mmol/L (ref 22–32)
Calcium: 8.7 mg/dL — ABNORMAL LOW (ref 8.9–10.3)
Chloride: 109 mmol/L (ref 98–111)
Creatinine, Ser: 2.35 mg/dL — ABNORMAL HIGH (ref 0.44–1.00)
GFR, Estimated: 22 mL/min — ABNORMAL LOW (ref >60)
Glucose, Bld: 129 mg/dL — ABNORMAL HIGH (ref 70–99)
Potassium: 5.2 mmol/L — ABNORMAL HIGH (ref 3.5–5.1)
Sodium: 138 mmol/L (ref 135–145)
Total Bilirubin: 0.8 mg/dL (ref 0.3–1.2)
Total Protein: 7 g/dL (ref 6.5–8.1)

## 2022-07-18 LAB — PROTIME-INR
INR: 1.5 — ABNORMAL HIGH (ref 0.8–1.2)
Prothrombin Time: 18.1 seconds — ABNORMAL HIGH (ref 11.4–15.2)

## 2022-07-18 MED ORDER — ONDANSETRON HCL 4 MG/2ML IJ SOLN
4.0000 mg | Freq: Four times a day (QID) | INTRAMUSCULAR | Status: DC | PRN
  Administered 2022-07-21: 4 mg via INTRAVENOUS
  Filled 2022-07-18: qty 2

## 2022-07-18 MED ORDER — WARFARIN SODIUM 4 MG PO TABS
4.0000 mg | ORAL_TABLET | Freq: Once | ORAL | Status: AC
  Administered 2022-07-18: 4 mg via ORAL
  Filled 2022-07-18: qty 1

## 2022-07-18 MED ORDER — CARVEDILOL 25 MG PO TABS
25.0000 mg | ORAL_TABLET | Freq: Two times a day (BID) | ORAL | Status: DC
  Administered 2022-07-19 – 2022-07-22 (×7): 25 mg via ORAL
  Filled 2022-07-18 (×8): qty 1

## 2022-07-18 MED ORDER — PANTOPRAZOLE SODIUM 40 MG PO TBEC: 40.0000 mg | DELAYED_RELEASE_TABLET | Freq: Every day | ORAL | 0 refills | Status: AC

## 2022-07-18 MED ORDER — SODIUM CHLORIDE 0.9 % IV BOLUS
1000.0000 mL | Freq: Once | INTRAVENOUS | Status: AC
  Administered 2022-07-18: 1000 mL via INTRAVENOUS

## 2022-07-18 MED ORDER — OXYCODONE HCL 5 MG PO TABS: 5.0000 mg | ORAL_TABLET | ORAL | 0 refills | Status: DC | PRN

## 2022-07-18 MED ORDER — PANTOPRAZOLE SODIUM 40 MG PO TBEC
40.0000 mg | DELAYED_RELEASE_TABLET | Freq: Every day | ORAL | Status: DC
  Administered 2022-07-19 – 2022-07-22 (×4): 40 mg via ORAL
  Filled 2022-07-18 (×4): qty 1

## 2022-07-18 MED ORDER — WARFARIN - PHARMACIST DOSING INPATIENT: Freq: Every day | Status: DC

## 2022-07-18 MED ORDER — ATORVASTATIN CALCIUM 40 MG PO TABS
40.0000 mg | ORAL_TABLET | Freq: Every day | ORAL | Status: DC
  Administered 2022-07-18 – 2022-07-21 (×4): 40 mg via ORAL
  Filled 2022-07-18 (×4): qty 1

## 2022-07-18 MED ORDER — ACETAMINOPHEN 325 MG PO TABS
650.0000 mg | ORAL_TABLET | Freq: Four times a day (QID) | ORAL | Status: DC | PRN
  Administered 2022-07-19 (×2): 650 mg via ORAL
  Filled 2022-07-18 (×2): qty 2

## 2022-07-18 MED ORDER — WARFARIN SODIUM 5 MG PO TABS: 5.0000 mg | ORAL_TABLET | Freq: Once | ORAL | Status: DC

## 2022-07-18 MED ORDER — ONDANSETRON HCL 4 MG PO TABS: 4.0000 mg | ORAL_TABLET | Freq: Four times a day (QID) | ORAL | Status: DC | PRN

## 2022-07-18 MED ORDER — OXYCODONE HCL 5 MG PO TABS
5.0000 mg | ORAL_TABLET | ORAL | Status: DC | PRN
  Administered 2022-07-18 – 2022-07-21 (×7): 5 mg via ORAL
  Filled 2022-07-18 (×7): qty 1

## 2022-07-18 MED ORDER — SODIUM CHLORIDE 0.9 % IV SOLN: INTRAVENOUS | Status: AC

## 2022-07-18 MED ORDER — ORAL CARE MOUTH RINSE: 15.0000 mL | OROMUCOSAL | Status: DC | PRN

## 2022-07-18 MED ORDER — ACETAMINOPHEN 650 MG RE SUPP: 650.0000 mg | Freq: Four times a day (QID) | RECTAL | Status: DC | PRN

## 2022-07-18 NOTE — Assessment & Plan Note (Addendum)
Remains on coumadin. Could not afford Eliquis. Pharmacy consult for inr management.

## 2022-07-18 NOTE — ED Triage Notes (Signed)
Per EMS- patient was discharged from the hospital today. Patient had a fall once she tried to ambulate with her walker. Patient states she was weak and couldn't ambulate using the walker. Patient c/o left ankle pain and swelling.

## 2022-07-18 NOTE — ED Notes (Addendum)
Patient's urine is dark red/maroon. Urine sample sent to lab per order. Patient has coumadin ordered. Paged J. Olena Heckle. Coumadin held. Will continue to monitor.

## 2022-07-18 NOTE — TOC Transition Note (Addendum)
Transition of Care [facility]) - CM/SW Discharge Note   Patient Details  Name: Kristi Meyers MRN: [id number] Date of Birth: 24-Jul-1956  [facility] CM/SW Contact:  Roseanne Kaufman, RN Phone Number: 07/18/2022, 11:37 AM   Clinical Narrative:   [employer] will deliver rollator walker to bedside, beside commode and wheelchair will be delivered to patient's home today. Daughter will transport patient at discharge.   TOC will continue to follow.   -12:15p [employer] has delivered walker( rollator) to patient's bedside.  No additional TOC needs.     Barriers to Discharge: Continued Medical Work up   Patient Goals and CMS Choice Patient states their goals for this hospitalization and ongoing recovery are:: return home with home health CMS Medicare.gov Compare Post Acute Care list provided to:: Patient Choice offered to / list presented to : Patient  Discharge Placement                       Discharge Plan and Services     Post Acute Care Choice: Home Health                               Social Determinants of Health (SDOH) Interventions     Readmission Risk Interventions     No data to display

## 2022-07-18 NOTE — Progress Notes (Signed)
[place] for Warfarin Indication:  History of DVT and saddle PE  No Known Allergies  Patient Measurements: Height: '5\' 4"'$  (162.6 cm) Weight: 85.6 kg (188 lb 11.4 oz) IBW/kg (Calculated) : 54.7  Vital Signs: Temp: 97.6 F (36.4 C) (10/03 1620) Temp Source: Oral (10/03 1620) BP: 130/80 (10/03 1620) Pulse Rate: 64 (10/03 1620)  Labs: Recent Labs    07/16/22 0511 07/17/22 0423 07/18/22 0403 07/18/22 1728  HGB 8.5* 8.9*  --  9.1*  HCT 26.9* 28.7*  --  29.2*  PLT 228 233  --  223  LABPROT 20.0* 16.6* 18.1*  --   INR 1.7* 1.4* 1.5*  --   CREATININE  --   --   --  2.35*     Estimated Creatinine Clearance: 24.3 mL/min (A) (by C-G formula based on SCr of 2.35 mg/dL (H)).   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 -Of note, INR was elevated (5.1) on previous admission on 9/25  Assessment: Pharmacy consulted to dose warfarin for this 66 y.o. female with a medical history significant for left lower extremity DVT and saddle PE and who was admitted with gross hematuria and supratherapeutic INR on 9/25.  Warfarin was held during previous admission from 9/25 - 10/1 and resumed on 10/2 s/p EGD. Pt was discharged on 10/3, but fell shortly after discharge (did not hit her head) and is now being readmitted.   Today, 07/18/22 INR = 1.5 remains low as expected after holding  warfarin during previous admission CBC: Hgb low but stable, Plt WNL Discussed with MD - no bridge at this time given recent admission with gross hematuria DDI: No other medications ordered at this time.  Pt did not take warfarin dose today. Last dose 10/2 inpatient  Goal of Therapy:  INR 2-3 Monitor platelets by anticoagulation protocol: Yes   Plan:  Warfarin 4 mg PO this evening CBC, INR with AM labs tomorrow. INR daily. Monitor for signs of bleeding.   Lenis Noon, PharmD 07/18/22 8:43 PM

## 2022-07-18 NOTE — Assessment & Plan Note (Signed)
Stable. Pt was not receiving losartan during her hospital stay 9-25 to 10-3.  Likely her AKI due to poor po liquid intake.

## 2022-07-18 NOTE — H&P (Signed)
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  Patient was discharged today around 1 PM.  Patient had been hospitalized from 9/25 through 07/18/2022 due to gross hematuria and acute blood loss anemia.  When she got home with her daughter, patient was trying to get into her house.  She fell outside the front door of her house.  Daughter [name]) states that patient got weak in her legs and could not support her weight.  She did not hit her head.  Patient brought back to the ER at 4:20 PM.  On arrival temp 97.6 heart rate 64 blood pressure 130/80 satting 95% on room air.  Labs show sodium 138, potassium 5.2, BUN of 50, creatinine 2.35  Last chemistry that was done in the hospital on 07/11/2022 showed a BUN of 21, creatinine 0.95.  CBC showed a white count of 14.8, hemoglobin 9.1, platelets 223  Left ankle x-ray negative for fracture.  Due to AKI and patient inability to walk, [facility] hospitalist contacted for admission.   ED Course: Left ankle x-ray negative.  BUN, creatinine elevated.  New AKI.  Review of Systems:  Review of Systems  Constitutional: Negative.   HENT: Negative.    Eyes: Negative.   Respiratory: Negative.    Cardiovascular: Negative.   Gastrointestinal: Negative.   Genitourinary: Negative.   Musculoskeletal:  Positive for falls.  Skin: Negative.   Neurological:  Positive for weakness.  Endo/Heme/Allergies: Negative.   Psychiatric/Behavioral: Negative.    All other systems reviewed and are negative.   Past  Medical History:  Diagnosis Date   Abnormal EKG    hx of left bundle branch block    Acute saddle pulmonary embolus (HCC) 12/10/2017   Anginal pain (HCC)    stable   GERD (gastroesophageal reflux disease)    Heart murmur    soft systolic per dr Terrence Dupont [id number] lov   High cholesterol    Hypertension    Left leg DVT ([place]) 11/2017   pulmonary embolus   Migraine    PMB (postmenopausal bleeding)    Vertigo     Past Surgical History:  Procedure Laterality Date   CHOLECYSTECTOMY  2011   DILATATION & CURETTAGE/HYSTEROSCOPY WITH MYOSURE N/A 09/07/2020   Procedure: DILATATION & CURETTAGE/HYSTEROSCOPY WITH MYOSURE;  Surgeon: Salvadore Dom, MD;  Location: [facility];  Service: Gynecology;  Laterality: N/A;   IR ANGIOGRAM PULMONARY BILATERAL SELECTIVE  12/10/2017   IR ANGIOGRAM SELECTIVE EACH ADDITIONAL VESSEL  12/10/2017   IR ANGIOGRAM SELECTIVE EACH ADDITIONAL VESSEL  12/10/2017   IR INFUSION THROMBOL ARTERIAL INITIAL (MS)  12/10/2017   IR INFUSION THROMBOL ARTERIAL INITIAL (MS)  12/10/2017   IR THROMB F/U EVAL ART/VEN FINAL DAY (MS)  12/11/2017   IR US GUIDE VASC ACCESS RIGHT  12/10/2017     reports that she quit smoking about 4 years ago. Her smoking use included cigarettes. She has a 5.00 pack-year smoking history. She has never used smokeless tobacco. She reports current drug use. Drug: Marijuana. She reports that she does not drink alcohol.  No  Known Allergies  History reviewed. No pertinent family history.  Prior to Admission medications   Medication Sig Start Date End Date Taking? Authorizing Provider  acetaminophen (TYLENOL) 500 MG tablet Take 500 mg by mouth every 6 (six) hours as needed.    [provider]  amLODipine (NORVASC) 5 MG tablet Take 5 mg by mouth daily. 04/15/22   [provider]  atorvastatin (LIPITOR) 40 MG tablet Take 40 mg by mouth at bedtime.     [provider]  carvedilol (COREG) 25 MG tablet Take 25 mg by mouth  2 (two) times daily with a meal.    [provider]  losartan (COZAAR) 100 MG tablet Take 100 mg by mouth daily. 04/30/22   [provider]  meclizine (ANTIVERT) 12.5 MG tablet Take 12.5 mg by mouth 2 (two) times daily.    [provider]  nitroGLYCERIN (NITROSTAT) 0.4 MG SL tablet Place under the tongue. 06/29/22   [provider]  oxyCODONE (OXY IR/ROXICODONE) 5 MG immediate release tablet Take 1 tablet (5 mg total) by mouth every 4 (four) hours as needed for up to 3 days for moderate pain or breakthrough pain. 07/18/22 07/21/22  Barb Merino, MD  pantoprazole (PROTONIX) 40 MG tablet Take 1 tablet (40 mg total) by mouth daily. 07/19/22 08/18/22  Barb Merino, MD  warfarin (COUMADIN) 4 MG tablet Take 4 mg by mouth. 4 mg daily and 2 mg on sunday    [provider]  warfarin (COUMADIN) 5 MG tablet Take 5 mg by mouth daily. 05/12/20   [provider]    Physical Exam: Vitals:   07/18/22 1620 07/18/22 1624  BP: 130/80   Pulse: 64   Resp: 18   Temp: 97.6 F (36.4 C)   TempSrc: Oral   SpO2: 95%   Weight:  85.6 kg  Height:  '5\' 4"'$  (1.626 m)    Physical Exam Vitals and nursing note reviewed.  Constitutional:      General: She is not in acute distress.    Appearance: She is obese. She is not ill-appearing, toxic-appearing or diaphoretic.  HENT:     Head: Normocephalic and atraumatic.     Nose: Nose normal.  Cardiovascular:     Rate and Rhythm: Normal rate and regular rhythm.     Pulses: Normal pulses.  Pulmonary:     Effort: Pulmonary effort is normal. No respiratory distress.     Breath sounds: Normal breath sounds. No wheezing or rales.  Abdominal:     General: Bowel sounds are normal. There is no distension.     Tenderness: There is no abdominal tenderness. There is no guarding or rebound.  Musculoskeletal:     Right lower leg: No edema.     Left lower leg: No edema.  Skin:    General: Skin is warm and dry.     Capillary  Refill: Capillary refill takes less than 2 seconds.     Findings: Bruising present.     Comments: Mild bruising left lateral ankle.  Neurological:     General: No focal deficit present.     Mental Status: She is alert and oriented to person, place, and time.      Labs on Admission: I have personally reviewed following labs and imaging studies  CBC: Recent Labs  Lab 07/12/22 0937 07/12/22 1653 07/14/22 0444 07/15/22 0523 07/16/22 0511 07/17/22 0423 07/18/22 1728  WBC 9.9  --   --  15.8* 14.2* 13.1* 14.8*  NEUTROABS  --   --   --  11.0* 9.7* 8.8* 11.6*  HGB 10.5*   < > 9.6* 8.8* 8.5* 8.9* 9.1*  HCT 33.1*   < > 30.6* 28.4* 26.9* 28.7* 29.2*  MCV 86.6  --   --  87.7 85.7 86.7 86.1  PLT 278  --   --  253 228 233 223   < > = values in this interval not displayed.   Basic Metabolic Panel: Recent Labs  Lab 07/18/22 1728  NA 138  K 5.2*  CL 109  CO2 24  GLUCOSE 129*  BUN 50*  CREATININE 2.35*  CALCIUM 8.7*   GFR: Estimated Creatinine Clearance: 24.3 mL/min (A) (by C-G formula based on SCr of 2.35 mg/dL (H)). Liver Function Tests: Recent Labs  Lab 07/18/22 1728  AST 16  ALT 16  ALKPHOS 61  BILITOT 0.8  PROT 7.0  ALBUMIN 3.1*   No results for input(s): "LIPASE", "AMYLASE" in the last 168 hours. No results for input(s): "AMMONIA" in the last 168 hours. Coagulation Profile: Recent Labs  Lab 07/14/22 0444 07/15/22 0523 07/16/22 0511 07/17/22 0423 07/18/22 0403  INR 3.7* 2.6* 1.7* 1.4* 1.5*   Cardiac Enzymes: No results for input(s): "CKTOTAL", "CKMB", "CKMBINDEX", "TROPONINI", "TROPONINIHS" in the last 168 hours. BNP (last 3 results) No results for input(s): "PROBNP" in the last 8760 hours. HbA1C: No results for input(s): "HGBA1C" in the last 72 hours. CBG: No results for input(s): "GLUCAP" in the last 168 hours. Lipid Profile: No results for input(s): "CHOL", "HDL", "LDLCALC", "TRIG", "CHOLHDL", "LDLDIRECT" in the last 72 hours. Thyroid Function  Tests: No results for input(s): "TSH", "T4TOTAL", "FREET4", "T3FREE", "THYROIDAB" in the last 72 hours. Anemia Panel: No results for input(s): "VITAMINB12", "FOLATE", "FERRITIN", "TIBC", "IRON", "RETICCTPCT" in the last 72 hours. Urine analysis:    Component Value Date/Time   COLORURINE RED (A) 07/11/2022 0100   APPEARANCEUR TURBID (A) 07/11/2022 0100   LABSPEC  07/11/2022 0100    TEST NOT REPORTED DUE TO COLOR INTERFERENCE OF URINE PIGMENT   PHURINE  07/11/2022 0100    TEST NOT REPORTED DUE TO COLOR INTERFERENCE OF URINE PIGMENT   GLUCOSEU (A) 07/11/2022 0100    TEST NOT REPORTED DUE TO COLOR INTERFERENCE OF URINE PIGMENT   HGBUR (A) 07/11/2022 0100    TEST NOT REPORTED DUE TO COLOR INTERFERENCE OF URINE PIGMENT   BILIRUBINUR (A) 07/11/2022 0100    TEST NOT REPORTED DUE TO COLOR INTERFERENCE OF URINE PIGMENT   BILIRUBINUR Negative 07/06/2020 1136   KETONESUR (A) 07/11/2022 0100    TEST NOT REPORTED DUE TO COLOR INTERFERENCE OF URINE PIGMENT   PROTEINUR (A) 07/11/2022 0100    TEST NOT REPORTED DUE TO COLOR INTERFERENCE OF URINE PIGMENT   UROBILINOGEN negative (A) 07/06/2020 1136   NITRITE (A) 07/11/2022 0100    TEST NOT REPORTED DUE TO COLOR INTERFERENCE OF URINE PIGMENT   LEUKOCYTESUR (A) 07/11/2022 0100    TEST NOT REPORTED DUE TO COLOR INTERFERENCE OF URINE PIGMENT    Radiological Exams on Admission: I have personally reviewed images DG Knee Complete 4 Views Left  Result Date: 07/18/2022 CLINICAL DATA:  Fell, weakness EXAM: LEFT KNEE - COMPLETE 4+ VIEW COMPARISON:  None Available. FINDINGS: Frontal, bilateral oblique, and cross-table lateral views of the left knee are obtained. No acute fracture, subluxation, or dislocation. 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 No joint effusion. Soft tissues are unremarkable. IMPRESSION: 1. Moderate 3 compartmental osteoarthritis.  No acute fracture. Electronically Signed   By: Randa Ngo M.D.   On: 07/18/2022 17:26  DG Ankle  Complete Left  Result Date: 07/18/2022 CLINICAL DATA:  [place], swelling EXAM: LEFT ANKLE COMPLETE - 3+ VIEW COMPARISON:  None Available. FINDINGS: Frontal, oblique, and lateral views of the left ankle are obtained. There are no acute displaced fractures. Alignment is anatomic. Mild osteoarthritis of the ankle and hindfoot. Mild anterior soft tissue swelling. IMPRESSION: 1. Mild anterior soft tissue swelling.  No acute displaced fracture. Electronically Signed   By: Randa Ngo M.D.   On: 07/18/2022 17:25    EKG: My personal interpretation of EKG shows: no EKG  Assessment/Plan Principal Problem:   AKI (acute kidney injury) ([place]) Active Problems:   Impaired ambulation   History of pulmonary embolus (PE) - Had saddle PE in 2019 requiring EKOS   ABLA (acute blood loss anemia)   Essential hypertension   Hyperlipidemia, unspecified   Long term (current) use of anticoagulants - 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 Could not afford Eliquis.    Assessment and Plan: * AKI (acute kidney injury) ([place]) Admit to med/surg bed. Continue with IVF. Repeat BMP. Likely due to poor po intake.  Impaired ambulation Does not appear that pt walked with PT during her 7 day hospital stay from 9-25 to 10-3. Will need PT consult. TOC consult for SNF placement. Dtr(tiffany) says pt was already using walker(in the house) and cane(outside the house) for assistance. Mostly for balance.  Long term (current) use of anticoagulants - 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 Could not afford Eliquis. Remains on coumadin. Could not afford Eliquis. Pharmacy consult for inr management.  Hyperlipidemia, unspecified Continue with lipitor 40 mg qhs.  Essential hypertension Stable. Pt was not receiving losartan during her hospital stay 9-25 to 10-3.  Likely her AKI due to poor po liquid intake.  ABLA (acute blood loss anemia) S/p EGD on 07-17-2022 that was negative as source of acute blood loss anemia that pt was  initially admitted for 07-10-2022.  History of pulmonary embolus (PE) - Had saddle PE in 2019 requiring EKOS On coumadin for VTE prevention. Had saddle PE in 2019 requiring EKOS   DVT prophylaxis: Coumadin Code Status: Full Code Family Communication: discussed with dtr [place]  Disposition Plan: SNF placement  Consults called: none  Admission status: Inpatient, Med-Surg   [name], DO [facility] 07/18/2022, 8:16 PM

## 2022-07-18 NOTE — Progress Notes (Signed)
   UA still with hematuria. Hold coumadin tonight.  [name], DO [facility]

## 2022-07-18 NOTE — Social Work (Signed)
CSW read through the Pt notes as she was just DC this afternoon. The Pt refused PT CSW ask the provider to order other one as the Pt will need this to possibly become accepted into a SNF. The provider also stated that they Pt may need to be readmitted. CSW did generate the Pt's PASRR. TOC will continue to follow and update in the AM on Pt's status.

## 2022-07-18 NOTE — Discharge Summary (Signed)
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  She also noticed blood in her urine for 3 days.   In the emergency room afebrile.  96% on room air.  Hemoglobin 11.2, repeat 9.9.  INR 5.1.   Patient was found to have gross hematuria with right kidneys pelvicalyceal system hematoma and E. coli UTI.  She was seen and followed by urology.  Hematuria improved after stopping warfarin. She had abnormal CT scan of the stomach on presentation that prompted upper GI endoscopy and was essentially normal.  Plan: Completed 7 days of antibiotic therapy for E. coli UTI. Hematuria stabilized, improved with conservative management and bringing down the INR. Urology recommended outpatient follow-up, resume Coumadin. Coumadin resumed 10/2, currently no indication for bridging with Lovenox or heparin as her last thromboembolic event was in [id number]. As per urology recommendation, she is going home with planned outpatient follow-up. Hemoglobin remained stable after initial drop however no ongoing bleeding or needing any blood transfusions.    Discharge Diagnoses:  Principal Problem:   Gross hematuria Active Problems:   Abnormal CT scan, stomach    Epigastric pain   ABLA (acute blood loss anemia)    Discharge Instructions  Discharge Instructions     Ambulatory referral to Gastroenterology   Complete by: As directed    What is the reason for referral?: Other   Call MD for:   Complete by: As directed    Noticing any blood in urine   Call MD for:  severe uncontrolled pain   Complete by: As directed    Diet - low sodium heart healthy   Complete by: As directed    Increase activity slowly   Complete by: As directed       Allergies as of 07/18/2022   No Known Allergies      Medication List     STOP taking these medications    traMADol 50 MG tablet Commonly known as: ULTRAM       TAKE these medications    acetaminophen 500 MG tablet Commonly known as: TYLENOL Take 500 mg by mouth every 6 (six) hours as needed.   amLODipine 5 MG tablet Commonly known as: NORVASC Take 5 mg by mouth daily.   atorvastatin 40 MG tablet Commonly known as: LIPITOR Take 40 mg by mouth at bedtime.   carvedilol 25 MG tablet Commonly known as: COREG Take 25 mg by mouth 2 (two) times daily with a meal.   losartan 100 MG tablet Commonly known as: COZAAR Take 100 mg by mouth daily.   meclizine 12.5 MG tablet Commonly known as: ANTIVERT Take 12.5 mg by mouth 2 (two) times daily.   nitroGLYCERIN 0.4 MG SL tablet Commonly known as: NITROSTAT Place under the tongue.   oxyCODONE 5 MG immediate release tablet Commonly known as: Oxy IR/ROXICODONE Take 1 tablet (5  mg total) by mouth every 4 (four) hours as needed for up to 3 days for moderate pain or breakthrough pain.   pantoprazole 40 MG tablet Commonly known as: PROTONIX Take 1 tablet (40 mg total) by mouth daily. Start taking on: July 19, 2022   warfarin 4 MG tablet Commonly known as: COUMADIN Take 4 mg by mouth. 4 mg daily and 2 mg on 'sunday   warfarin 5 MG tablet Commonly known as: COUMADIN Take 5 mg by mouth daily.               Durable Medical Equipment   (From admission, onward)           Start     Ordered   07/18/22 1105  For home use only DME standard manual wheelchair with seat cushion  Once       Comments: Patient suffers from pulmonary embolism, Dvt, bleeding  which impairs their ability to perform daily activities like bathing, grooming, and toileting in the home.  A cane or walker will not resolve issue with performing activities of daily living. A wheelchair will allow patient to safely perform daily activities. Patient can safely propel the wheelchair in the home or has a caregiver who can provide assistance. Length of need Lifetime. Accessories: elevating leg rests (ELRs), wheel locks, extensions and anti-tippers.   07/18/22 1105   07/18/22 1104  For home use only DME Walker rolling  Once       Question Answer Comment  Walker: With 5 Inch Wheels   Patient needs a walker to treat with the following condition Gross hematuria      10'$ /03/23 1105   07/18/22 1104  For home use only DME Bedside commode  Once       Question:  Patient needs a bedside commode to treat with the following condition  Answer:  Gross hematuria   07/18/22 1105            Follow-up Information     Clarene Essex, MD. Schedule an appointment as soon as possible for a visit .   Specialty: Gastroenterology Contact information: [address] [place] [address] [phone]         Salvadore Dom, MD. Schedule an appointment as soon as possible for a visit in 3 days.   Specialty: Obstetrics and Gynecology Contact information: [address] [place] [address] [phone]         Ceasar Mons, MD Follow up in 1 month(s).   Specialty: Urology Why: Call to schedule appointment Contact information: [address] [place] [address] [phone]                No Known Allergies  Consultations: Urology Gastroenterology   Procedures/Studies: CT ABDOMEN PELVIS W  CONTRAST  Result Date: 07/15/2022 CLINICAL DATA:  Follow-up right renal hematoma/lesion. EXAM: CT ABDOMEN AND PELVIS WITH CONTRAST TECHNIQUE: Multidetector CT imaging of the abdomen and pelvis was performed using the standard protocol following bolus administration of intravenous contrast. RADIATION DOSE REDUCTION: This exam was performed according to the departmental dose-optimization program which includes automated exposure control, adjustment of the mA and/or kV according to patient size and/or use of iterative reconstruction technique. CONTRAST:  11m OMNIPAQUE IOHEXOL 300 MG/ML  SOLN COMPARISON:  07/12/2022; 07/10/2022 FINDINGS: Lower chest: Limited visualization of the lower thorax demonstrates minimal bibasilar subsegmental atelectasis, right greater than left. Note is again made of a approximately 3.7 x 2.2 cm bulla/cyst within  the imaged left lower lobe (image 47, series 4). Normal heart size. Coronary artery calcifications. Calcifications within the aortic valve leaflets. No pericardial effusion though small amount of fluid is seen with the pericardial recess. Hepatobiliary: Normal hepatic contour. There is a subcentimeter hypoattenuating lesion with the anterior segment of the right lobe of the liver (image 25, series 2) which is too small to accurately characterize though favored to represent a hepatic cyst. Post cholecystectomy. No intra or extrahepatic biliary ductal dilatation. No ascites. Pancreas: Normal appearance of the pancreas. Spleen: Normal appearance of the spleen. Adrenals/Urinary Tract: Redemonstrated high density nonenhancing filling defects, presumably blood clot, within the right renal collecting system, slightly progressed compared to the 07/12/2022 examination again, the etiology of which is not depicted on this examination. Mild right-sided pelvicaliectasis without asymmetric perinephric stranding. There is preserved symmetric enhancement of the bilateral renal parenchyma.  Unchanged approximately 1.8 cm hypoattenuating nonenhancing cyst within the interpolar aspect of the left kidney (coronal image 71, series 5). Additional bilateral subcentimeter hypoattenuating renal lesions are too small to accurately characterize though favored to represent additional renal cysts. There is mild thickening of the bilateral adrenal glands without discrete nodule. Normal appearance of the urinary bladder given underdistention. Stomach/Bowel: Ingested enteric contrast extends to the level of the distal small bowel. No evidence of enteric obstruction. Previously questioned focal outpouching involving degraded curvature of the stomach is again not demonstrated on the present examination. There is persistent elevation/eventration of the left hemidiaphragm. No hiatal hernia. Normal appearance of the terminal ileum and the retrocecal appendix. No pneumoperitoneum, pneumatosis or portal venous gas. Vascular/Lymphatic: Redemonstrated aneurysmal dilatation of the infrarenal abdominal aorta measuring 36 mm in diameter (image 52, series 2). 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 There is a large amount of slightly irregular mixed calcified and noncalcified atherosclerotic plaque throughout the abdominal aorta, not resulting in hemodynamically significant stenosis. Large amount of irregular mixed calcified and noncalcified atherosclerotic plaque throughout the left common iliac artery, the bilateral external iliac arteries as well as the imaged portions of the bilateral superficial femoral arteries likely resulting in tandem areas of hemodynamically significant narrowing, incompletely evaluated on this non CTA examination. No bulky retroperitoneal, mesenteric, pelvic or inguinal lymphadenopathy. Reproductive: Normal appearance of the pelvic organs for age. No free fluid the pelvic cul-de-sac. Other: Tiny mesenteric fat containing periumbilical hernia. Right-sided  gluteal calcifications. Musculoskeletal: No acute or aggressive osseous abnormalities. Moderate to severe multilevel lumbar spine DDD, likely worse at L5-S1 with disc space height loss, endplate irregularity and posteriorly directed disc osteophyte complex at this location. IMPRESSION: 1. Redemonstrated high-density nonenhancing filling defect throughout the right renal collecting system and renal pelvis, unchanged to slightly progressed compared to the 07/12/2022 examination and while indeterminate is favored to represent blood clot, the etiology of which is again not depicted on the present examination. Further evaluation with ureteroscopy could be performed as indicated. 2. Mild right-sided pelvicaliectasis without asymmetrically delayed renal enhancement. 3. 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 Abdominal Aortic Aneurysm (ICD10-I71.9). Recommend follow-up aortic ultrasound in 2 years. This recommendation follows ACR consensus guidelines: White Paper of the ACR Incidental Findings Committee II on Vascular Findings. J Am Coll Radiol 2013; 10:789-794. 4. Suspected hemodynamically significant narrowings involving left common iliac artery as well as the bilateral external iliac and imaged portions of the bilateral superficial femoral arteries. Correlation for symptoms of PAD is advised. Aortic Atherosclerosis (ICD10-I70.0).  5. Previously questioned masslike area involving the greater curvature of the stomach is again not demonstrated on the present examination and thus may be artifactual due to associated diaphragmatic hernia/eventration. Electronically Signed   By: Sandi Mariscal M.D.   On: 07/15/2022 16:40   CT ABDOMEN PELVIS W WO CONTRAST  Result Date: 07/12/2022 CLINICAL DATA:  Hematuria, right-sided abdominal pain radiating to the right leg. EXAM: CT ABDOMEN AND PELVIS WITHOUT AND WITH CONTRAST TECHNIQUE: Multidetector CT  imaging of the abdomen and pelvis was performed following the standard protocol before and following the bolus administration of intravenous contrast. RADIATION DOSE REDUCTION: This exam was performed according to the departmental dose-optimization program which includes automated exposure control, adjustment of the mA and/or kV according to patient size and/or use of iterative reconstruction technique. CONTRAST:  147m OMNIPAQUE IOHEXOL 300 MG/ML  SOLN COMPARISON:  07/10/2022. FINDINGS: Lower chest: Bibasilar volume loss, right greater than left. Heart size within normal limits. Atherosclerotic calcification of the aorta and coronary arteries. No pericardial or pleural effusion. Distal esophagus is grossly unremarkable. Hepatobiliary: Liver is decreased in attenuation diffusely. Cholecystectomy. No biliary ductal dilatation. Pancreas: Negative. Spleen: Negative. Adrenals/Urinary Tract: Right adrenal gland is unremarkable. Nodular thickening of the medial limb left adrenal gland, unchanged. No specific follow-up necessary. Right renal edema with subtle decreased attenuation of the right renal parenchyma on portal venous phase imaging. High attenuation filling defect in the upper pole calices (27/74 does not appear to enhance, measuring at least 1.5 x 1.8 cm (10/11). An additional linear filling defect is seen within the right renal pelvis on delayed imaging (10/15). Associated fullness of the right intrarenal collecting system and right renal pelvis. Poor opacification of the visualized portion of the upper right ureter. 2.2 cm nonenhancing cyst in interpolar left kidney (4/30). No follow-up necessary. Additional subcentimeter low-attenuation lesions in the kidneys are too small to characterize. There may be a punctate stone in the upper pole left kidney. No specific follow-up necessary. Ureters are decompressed. Bladder is grossly unremarkable. Stomach/Bowel: There may be mild proximal gastric wall thickening.  Previously questioned masslike area along the greater curvature of the stomach is not readily appreciated. Small bowel, appendix and colon are unremarkable. Vascular/Lymphatic: Abdominal aorta measures 3.3 cm at the level of the superior mesenteric artery and 3.6 cm below the level of the renal arteries. Atherosclerotic calcification of the aorta. Right common iliac artery measures 2.5 cm. No pathologically enlarged lymph nodes. Reproductive: Uterus is visualized. No adnexal mass. 2.2 cm left perineal cyst. Other: No free fluid. Mesenteries and peritoneum are unremarkable. Elevated left hemidiaphragm. Musculoskeletal: Degenerative changes in the spine. No worrisome lytic or sclerotic lesions. IMPRESSION: 1. High attenuation caliceal filling defect in the upper pole right kidney without enhancement, with an additional linear filling defect in the right renal pelvis, findings indicative of hemorrhage/clot. Difficult to definitively exclude malignancy. Associated fullness of the right renal pelvis and right intrarenal collecting system. 2. Possible punctate stone in the upper pole left kidney. 3. Probable proximal gastric wall thickening. Previously questioned masslike area within the greater curvature of the gastric fundus is not readily appreciated. 4. Hepatic steatosis. 5. 3.6 cm infrarenal aortic aneurysm. Recommend follow-up every 2 years. This recommendation follows ACR consensus guidelines: White Paper of the ACR Incidental Findings Committee II on Vascular Findings. J Am Coll Radiol 2013; 10:789-794. 6. Aortic atherosclerosis (ICD10-I70.0). Coronary artery calcification. Right common iliac artery aneurysm. Electronically Signed   By: MLorin PicketM.D.   On: 07/12/2022 08:38   [place]PELVIC  COMPLETE W TRANSVAGINAL AND TORSION R/O  Result Date: 07/10/2022 CLINICAL DATA:  Pelvic pain and vaginal bleeding EXAM: TRANSABDOMINAL AND TRANSVAGINAL ULTRASOUND OF PELVIS DOPPLER ULTRASOUND OF OVARIES TECHNIQUE: Both  transabdominal and transvaginal ultrasound examinations of the pelvis were performed. Transabdominal technique was performed for global imaging of the pelvis including uterus, ovaries, adnexal regions, and pelvic cul-de-sac. It was necessary to proceed with endovaginal exam following the transabdominal exam to visualize the uterus, endometrium, ovaries, and adnexa. Color and duplex Doppler ultrasound was utilized to evaluate blood flow to the ovaries. COMPARISON:  Pelvic ultrasound 06/17/2020 and same day CT abdomen and pelvis FINDINGS: Uterus Measurements: 6.2 x 3.6 x 4.9 cm = volume: 56 mL. Round heterogenous predominantly hypoechoic mass within the anterior uterine fundus measuring 1.8 x 1.4 x 1.7 cm compatible with fibroid. Endometrium Thickness: 1.3 mm.  No focal abnormality visualized. Right ovary Not visualized Left ovary Not visualized Pulsed Doppler evaluation of both ovaries demonstrates normal low-resistance arterial and venous waveforms. Other findings No abnormal free fluid. IMPRESSION: 1. No acute sonographic abnormality in the pelvis. 2. Fibroid within the anterior uterine fundus with some mild distortion of the endometrial canal. 3. No focal endometrial abnormality or significant endometrial thickening. 4. Nonvisualization of the ovaries. Electronically Signed   By: Placido Sou M.D.   On: 07/10/2022 20:01   CT Renal Stone Study  Result Date: 07/10/2022 CLINICAL DATA:  Lower abdominal pain with hematuria x3 days. EXAM: CT ABDOMEN AND PELVIS WITHOUT CONTRAST TECHNIQUE: Multidetector CT imaging of the abdomen and pelvis was performed following the standard protocol without IV contrast. RADIATION DOSE REDUCTION: This exam was performed according to the departmental dose-optimization program which includes automated exposure control, adjustment of the mA and/or kV according to patient size and/or use of iterative reconstruction technique. COMPARISON:  CT December 10, 2017. FINDINGS: Lower chest:  No acute abnormality. Hepatobiliary: Unremarkable noncontrast enhanced appearance of the hepatic parenchyma. Gallbladder surgically absent. No biliary ductal dilation. Pancreas: No pancreatic ductal dilation or evidence of acute inflammation. Spleen: No splenomegaly. Adrenals/Urinary Tract: Bilateral adrenal glands are within normal limits. No hydronephrosis. Nonobstructive punctate left upper/interpolar renal stone. Hypodense 2.1 cm left renal lesion measures fluid density consistent with a cyst and considered benign requiring no independent imaging follow-up. Subcentimeter hypodense right renal lesion measures 9 mm on image 30/2, technically too small to accurately characterize but statistically likely to reflect a cyst. No renal, ureteral bladder calculi. Gas in the urinary bladder recommend correlation with history of recent instrumentation. Stomach/Bowel: No radiopaque enteric contrast material was administered. Masslike lobular area along the greater curvature of the gastric fundus best seen on coronal image 80/4 measuring 2.9 x 3.9 x 3.7 cm on images 18/2 and 80/4. No pathologic dilation of small or large bowel. Appendix and terminal ileum appear normal. Colonic diverticulosis without findings of acute diverticulitis. Vascular/Lymphatic: Aneurysmal dilation of the infrarenal abdominal aorta measures 3.4 cm on image 47/2 previously measuring 3.1 cm on CT December 10, 2017. 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 No pathologically enlarged abdominal or pelvic lymph nodes. Reproductive: Uterus and bilateral adnexa are unremarkable. Other: No significant abdominopelvic free fluid. Musculoskeletal: No acute osseous abnormality. Multilevel degenerative changes spine. IMPRESSION: 1. Nonobstructive punctate left upper/interpolar renal stone. No obstructive ureteral or bladder calculi. 2. Masslike 3.9 cm lobular exophytic area along the greater  curvature of the gastric fundus is nonspecific possibly reflecting a focal outpouching and fold thickening but is suspicious for a  discrete gastric mass such as a gastrointestinal stromal tumor. Suggest gastroenterology consult and further evaluation with nonemergent CT with oral and IV contrast versus upper endoscopy. 3. Colonic diverticulosis without findings of acute diverticulitis. 4. Increased size of the infrarenal abdominal aortic aneurysm now measuring 3.4 cm Recommend follow-up ultrasound every 3 years. This recommendation follows ACR consensus guidelines: White Paper of the ACR Incidental Findings Committee II on Vascular Findings. J Am Coll Radiol 2013; 10:789-794. 5. 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 6.  Aortic Atherosclerosis (ICD10-I70.0). Electronically Signed   By: Dahlia Bailiff M.D.   On: 07/10/2022 14:14   (Echo, Carotid, EGD, Colonoscopy, ERCP)    Subjective: Patient seen and examined.  He still has some right flank pain but much better.  She is eager to go home.  She thinks she can manage at home and her daughter going to help her.  Patient does have significant problem walking with her balance, she will benefit with DME's.  She will benefit with a wheelchair and walker as well as bedside commode.   Discharge Exam: Vitals:   07/18/22 0504 07/18/22 1000  BP: 130/72 109/71  Pulse: 78 77  Resp: 18 17  Temp: 98.4 F (36.9 C) 98.7 F (37.1 C)  SpO2: 94% 95%   Vitals:   07/17/22 2115 07/18/22 0500 07/18/22 0504 07/18/22 1000  BP: 113/86  130/72 109/71  Pulse: (!) 101  78 77  Resp: '19  18 17  '$ Temp: 99.3 F (37.4 C)  98.4 F (36.9 C) 98.7 F (37.1 C)  TempSrc: Oral  Oral Oral  SpO2: 97%  94% 95%  Weight:  85.6 kg    Height:        General: Pt is alert, awake, not in acute distress Cardiovascular: RRR, S1/S2 +, no rubs, no gallops Respiratory: CTA bilaterally, no wheezing, no rhonchi Abdominal: Soft, NT, ND, bowel sounds +,  no palpable tenderness today. Urine is clear on collection bag. Extremities: no edema, no cyanosis    The results of significant diagnostics from this hospitalization (including imaging, microbiology, ancillary and laboratory) are listed below for reference.     Microbiology: Recent Results (from the past 240 hour(s))  Urine Culture     Status: Abnormal   Collection Time: 07/11/22  1:00 AM   Specimen: In/Out Cath Urine  Result Value Ref Range Status   Specimen Description   Final    IN/OUT CATH URINE Performed at [facility], [place] [address] [place], [place] [address]    Special Requests   Final    NONE Performed at [facility], [place] [address], [place], [place] [address]    Culture >=100,000 COLONIES/mL ESCHERICHIA COLI (A)  Final   Report Status 07/13/2022 FINAL  Final   Organism ID, Bacteria ESCHERICHIA COLI (A)  Final      Susceptibility   Escherichia coli - MIC*    AMPICILLIN <=2 SENSITIVE Sensitive     CEFAZOLIN <=4 SENSITIVE Sensitive     CEFEPIME <=0.12 SENSITIVE Sensitive     CEFTRIAXONE <=0.25 SENSITIVE Sensitive     CIPROFLOXACIN <=0.25 SENSITIVE Sensitive     GENTAMICIN <=1 SENSITIVE Sensitive     IMIPENEM <=0.25 SENSITIVE Sensitive     NITROFURANTOIN <=16 SENSITIVE Sensitive     TRIMETH/SULFA <=20 SENSITIVE Sensitive     AMPICILLIN/SULBACTAM <=2 SENSITIVE Sensitive     PIP/TAZO <=4 SENSITIVE Sensitive     * >=100,000 COLONIES/mL ESCHERICHIA COLI  Labs: BNP (last 3 results) Recent Labs    07/10/22 1920  BNP 84.1   Basic Metabolic Panel: No results for input(s): "NA", "K", "CL", "CO2", "GLUCOSE", "BUN", "CREATININE", "CALCIUM", "MG", "PHOS" in the last 168 hours. Liver Function Tests: No results for input(s): "AST", "ALT", "ALKPHOS", "BILITOT", "PROT", "ALBUMIN" in the last 168 hours. No results for input(s): "LIPASE", "AMYLASE" in the last 168 hours. No results for input(s): "AMMONIA" in the last 168  hours. CBC: Recent Labs  Lab 07/12/22 0937 07/12/22 1653 07/13/22 1649 07/14/22 0444 07/15/22 0523 07/16/22 0511 07/17/22 0423  WBC 9.9  --   --   --  15.8* 14.2* 13.1*  NEUTROABS  --   --   --   --  11.0* 9.7* 8.8*  HGB 10.5*   < > 9.9* 9.6* 8.8* 8.5* 8.9*  HCT 33.1*   < > 31.6* 30.6* 28.4* 26.9* 28.7*  MCV 86.6  --   --   --  87.7 85.7 86.7  PLT 278  --   --   --  253 228 233   < > = values in this interval not displayed.   Cardiac Enzymes: No results for input(s): "CKTOTAL", "CKMB", "CKMBINDEX", "TROPONINI" in the last 168 hours. BNP: Invalid input(s): "POCBNP" CBG: No results for input(s): "GLUCAP" in the last 168 hours. D-Dimer No results for input(s): "DDIMER" in the last 72 hours. Hgb A1c No results for input(s): "HGBA1C" in the last 72 hours. Lipid Profile No results for input(s): "CHOL", "HDL", "LDLCALC", "TRIG", "CHOLHDL", "LDLDIRECT" in the last 72 hours. Thyroid function studies No results for input(s): "TSH", "T4TOTAL", "T3FREE", "THYROIDAB" in the last 72 hours.  Invalid input(s): "FREET3" Anemia work up No results for input(s): "VITAMINB12", "FOLATE", "FERRITIN", "TIBC", "IRON", "RETICCTPCT" in the last 72 hours. Urinalysis    Component Value Date/Time   COLORURINE RED (A) 07/11/2022 0100   APPEARANCEUR TURBID (A) 07/11/2022 0100   LABSPEC  07/11/2022 0100    TEST NOT REPORTED DUE TO COLOR INTERFERENCE OF URINE PIGMENT   PHURINE  07/11/2022 0100    TEST NOT REPORTED DUE TO COLOR INTERFERENCE OF URINE PIGMENT   GLUCOSEU (A) 07/11/2022 0100    TEST NOT REPORTED DUE TO COLOR INTERFERENCE OF URINE PIGMENT   HGBUR (A) 07/11/2022 0100    TEST NOT REPORTED DUE TO COLOR INTERFERENCE OF URINE PIGMENT   BILIRUBINUR (A) 07/11/2022 0100    TEST NOT REPORTED DUE TO COLOR INTERFERENCE OF URINE PIGMENT   BILIRUBINUR Negative 07/06/2020 1136   KETONESUR (A) 07/11/2022 0100    TEST NOT REPORTED DUE TO COLOR INTERFERENCE OF URINE PIGMENT   PROTEINUR (A) 07/11/2022  0100    TEST NOT REPORTED DUE TO COLOR INTERFERENCE OF URINE PIGMENT   UROBILINOGEN negative (A) 07/06/2020 1136   NITRITE (A) 07/11/2022 0100    TEST NOT REPORTED DUE TO COLOR INTERFERENCE OF URINE PIGMENT   LEUKOCYTESUR (A) 07/11/2022 0100    TEST NOT REPORTED DUE TO COLOR INTERFERENCE OF URINE PIGMENT   Sepsis Labs Recent Labs  Lab 07/12/22 0937 07/15/22 0523 07/16/22 0511 07/17/22 0423  WBC 9.9 15.8* 14.2* 13.1*   Microbiology Recent Results (from the past 240 hour(s))  Urine Culture     Status: Abnormal   Collection Time: 07/11/22  1:00 AM   Specimen: In/Out Cath Urine  Result Value Ref Range Status   Specimen Description   Final    IN/OUT CATH URINE Performed at [facility], [place] [address], [place], [place] [address]  Special Requests   Final    NONE Performed at [facility], [place] [address], [place], [place] [address]    Culture >=100,000 COLONIES/mL ESCHERICHIA COLI (A)  Final   Report Status 07/13/2022 FINAL  Final   Organism ID, Bacteria ESCHERICHIA COLI (A)  Final      Susceptibility   Escherichia coli - MIC*    AMPICILLIN <=2 SENSITIVE Sensitive     CEFAZOLIN <=4 SENSITIVE Sensitive     CEFEPIME <=0.12 SENSITIVE Sensitive     CEFTRIAXONE <=0.25 SENSITIVE Sensitive     CIPROFLOXACIN <=0.25 SENSITIVE Sensitive     GENTAMICIN <=1 SENSITIVE Sensitive     IMIPENEM <=0.25 SENSITIVE Sensitive     NITROFURANTOIN <=16 SENSITIVE Sensitive     TRIMETH/SULFA <=20 SENSITIVE Sensitive     AMPICILLIN/SULBACTAM <=2 SENSITIVE Sensitive     PIP/TAZO <=4 SENSITIVE Sensitive     * >=100,000 COLONIES/mL ESCHERICHIA COLI     Time coordinating discharge: 35 minutes  SIGNED:   Barb Merino, MD  [facility] 07/18/2022, 11:09 AM

## 2022-07-18 NOTE — Assessment & Plan Note (Signed)
Does not appear that pt walked with PT during her 7 day hospital stay from 9-25 to 10-3. Will need PT consult. TOC consult for SNF placement. Dtr(tiffany) says pt was already using walker(in the house) and cane(outside the house) for assistance. Mostly for balance.

## 2022-07-18 NOTE — Progress Notes (Signed)
Notified on call provider about patient complaining of nausea since patient did not have any PRN nausea medication. On call provider put in an order for Zofran IV 4 mg every 8 hours as needed for nausea, vomiting.

## 2022-07-18 NOTE — Assessment & Plan Note (Signed)
S/p EGD on 07-17-2022 that was negative as source of acute blood loss anemia that pt was initially admitted for 07-10-2022.

## 2022-07-18 NOTE — Assessment & Plan Note (Signed)
Admit to med/surg bed. Continue with IVF. Repeat BMP. Likely due to poor po intake.

## 2022-07-18 NOTE — Progress Notes (Signed)
Notified on call provider that tele called RN and said patient's heart rate got up into the 150's and came back down to the 110's. RN checked on patient and patient's heart rate was around the 80's-90's. Also, patient said at one point she was trying to get up, but other than that she was fine.

## 2022-07-18 NOTE — ED Provider Triage Note (Signed)
Emergency Medicine Provider Triage Evaluation Note  Kristi Meyers , a 66 y.o. female  was evaluated in triage.  Pt complains of left ankle pain after a fall today. She has not been able to bear weight. She was just discharged today for vaginal bleeding, hematuria, and UTI. Had a normal Endoscopy.   Review of Systems  Positive:  Negative:   Physical Exam  BP 130/80 (BP Location: Right Arm) Comment: Simultaneous filing. User may not have seen previous data.  Pulse 64 Comment: Simultaneous filing. User may not have seen previous data.  Temp 97.6 F (36.4 C) (Oral)   Resp 18   Ht '5\' 4"'$  (1.626 m)   Wt 85.6 kg   LMP 11/17/2008 (Approximate)   SpO2 95% Comment: Simultaneous filing. User may not have seen previous data.  BMI 32.39 kg/m  Gen:   Awake, no distress   Resp:  Normal effort  MSK:   Moves extremities without difficulty  Other:  Normal pulses. Left ankle swelling noted  Medical Decision Making  Medically screening exam initiated at 4:33 PM.  Appropriate orders placed.  Kristi Meyers was informed that the remainder of the evaluation will be completed by another provider, this initial triage assessment does not replace that evaluation, and the importance of remaining in the ED until their evaluation is complete.     Adolphus Birchwood, [place] 07/18/22 1635

## 2022-07-18 NOTE — TOC Initial Note (Addendum)
Transition of Care [facility]) - Initial/Assessment Note    Patient Details  Name: Kristi Meyers MRN: [id number] Date of Birth: 04-20-1956  Transition of Care [facility]) CM/SW Contact:    Kristi Kaufman, RN Phone Number: 07/18/2022, 10:38 AM  Clinical Narrative:  Received TOC consult for mold in home, attached social services resources to AVS for follow up within the community.  RN notified this RNCM that family is requesting DME: walker, wheelchair and commode. Spoke with patient at bedside patient prefers to have walker at discharge from hospital. Notified patient's daughter Kristi Meyers of patient's preference.      Notified Kristi Meyers with [facility] to request the following DME: wheelchair, walker, bedside commode. Patient is eligible to either wheelchair or walker at discharge. Notified Kristi Meyers that patient prefers walker at discharge. PEr Kristi Meyers the wheelchair will be delivered post discharge. Notified MD, awaiting DME orders.   TOC will continue to follow.                 Expected Discharge Plan: [place] Barriers to Discharge: Continued Medical Work up   Patient Goals and CMS Choice Patient states their goals for this hospitalization and ongoing recovery are:: return home with home health CMS Medicare.gov Compare Post Acute Care list provided to:: Patient Choice offered to / list presented to : Patient  Expected Discharge Plan and Services Expected Discharge Plan: [place] Acute Care Choice: Home Health                                        Prior Living Arrangements/Services                       Activities of Daily Living Home Assistive Devices/Equipment: Eyeglasses, Radio producer (specify quad or straight), Walker (specify type), Raised toilet seat with rails, Wheelchair ADL Screening (condition at time of admission) Patient's cognitive ability adequate to safely complete daily activities?: Yes Is the patient deaf or have  difficulty hearing?: No Does the patient have difficulty seeing, even when wearing glasses/contacts?: No Does the patient have difficulty concentrating, remembering, or making decisions?: No Patient able to express need for assistance with ADLs?: Yes Does the patient have difficulty dressing or bathing?: No Independently performs ADLs?: No Communication: Independent Dressing (OT): Independent Grooming: Independent Feeding: Independent Bathing: Independent Toileting: Needs assistance Is this a change from baseline?: Pre-admission baseline In/Out Bed: Needs assistance Is this a change from baseline?: Pre-admission baseline Walks in Home: Needs assistance Is this a change from baseline?: Pre-admission baseline Does the patient have difficulty walking or climbing stairs?: No Weakness of Legs: Both Weakness of Arms/Hands: Both  Permission Sought/Granted                  Emotional Assessment              Admission diagnosis:  Vaginal bleeding [N93.9] Gross hematuria [R31.0] Gastric mass [K31.89] Uterine leiomyoma, unspecified location [D25.9] Patient Active Problem List   Diagnosis Date Noted   ABLA (acute blood loss anemia)    Abnormal CT scan, stomach    Epigastric pain    Gross hematuria 07/11/2022   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 coli UTI 12/15/2017   Respiratory distress    Acute saddle pulmonary embolus ([place]) 12/10/2017   PCP:  Kristi Ruddy, MD Pharmacy:   [facility] [place], [place] [address] Phone: [phone] Fax: [phone]     Social Determinants of Health (SDOH) Interventions    Readmission Risk Interventions     No data to display

## 2022-07-18 NOTE — Plan of Care (Signed)

## 2022-07-18 NOTE — Assessment & Plan Note (Signed)
Continue with lipitor 40 mg qhs.

## 2022-07-18 NOTE — Anesthesia Postprocedure Evaluation (Signed)
Anesthesia Post Note  Patient: ANILA BOJARSKI  Procedure(s) Performed: ESOPHAGOGASTRODUODENOSCOPY (EGD) (Left) UPPER ESOPHAGEAL ENDOSCOPIC ULTRASOUND (EUS) BIOPSY     Patient location during evaluation: PACU Anesthesia Type: MAC Level of consciousness: awake and alert Pain management: pain level controlled Vital Signs Assessment: post-procedure vital signs reviewed and stable Respiratory status: spontaneous breathing, nonlabored ventilation, respiratory function stable and patient connected to nasal cannula oxygen Cardiovascular status: stable and blood pressure returned to baseline Postop Assessment: no apparent nausea or vomiting Anesthetic complications: no   No notable events documented.  Last Vitals:  Vitals:   07/18/22 0504 07/18/22 1000  BP: 130/72 109/71  Pulse: 78 77  Resp: 18 17  Temp: 36.9 C 37.1 C  SpO2: 94% 95%    Last Pain:  Vitals:   07/18/22 1000  TempSrc: Oral  PainSc:    Pain Goal: Patients Stated Pain Goal: 2 (07/18/22 0827)                 Tiajuana Amass

## 2022-07-18 NOTE — Subjective & Objective (Addendum)
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  Patient was discharged today around 1 PM.  Patient had been hospitalized from 9/25 through 07/18/2022 due to gross hematuria and acute blood loss anemia.  When she got home with her daughter, patient was trying to get into her house.  She fell outside the front door of her house.  Daughter [name]) states that patient got weak in her legs and could not support her weight.  She did not hit her head.  Patient brought back to the ER at 4:20 PM.  On arrival temp 97.6 heart rate 64 blood pressure 130/80 satting 95% on room air.  Labs show sodium 138, potassium 5.2, BUN of 50, creatinine 2.35  Last chemistry that was done in the hospital on 07/11/2022 showed a BUN of 21, creatinine 0.95.  CBC showed a white count of 14.8, hemoglobin 9.1, platelets 223  Left ankle x-ray negative for fracture.  Due to AKI and patient inability to walk, [facility] contacted for admission.

## 2022-07-18 NOTE — Progress Notes (Signed)
[facility] for Warfarin Indication:  History of DVT and saddle PE  No Known Allergies  Patient Measurements: Height: '5\' 4"'$  (162.6 cm) Weight: 85.6 kg (188 lb 11.4 oz) IBW/kg (Calculated) : 54.7  Vital Signs: Temp: 98.4 F (36.9 C) (10/03 0504) Temp Source: Oral (10/03 0504) BP: 130/72 (10/03 0504) Pulse Rate: 78 (10/03 0504)  Labs: Recent Labs    07/16/22 0511 07/17/22 0423 07/18/22 0403  HGB 8.5* 8.9*  --   HCT 26.9* 28.7*  --   PLT 228 233  --   LABPROT 20.0* 16.6* 18.1*  INR 1.7* 1.4* 1.5*     Estimated Creatinine Clearance: 60 mL/min (by C-G formula based on SCr of 0.95 mg/dL).   Medical History: Past Medical History:  Diagnosis Date   Abnormal EKG    hx of left bundle branch block    Anginal pain (HCC)    stable   GERD (gastroesophageal reflux disease)    Heart murmur    soft systolic per dr Terrence Dupont 01-75-1025 lov   High cholesterol    Hypertension    Left leg DVT ([place]) 11/2017   pulmonary embolus   Migraine    PMB (postmenopausal bleeding)    Vertigo     Medications:  On warfarin prior to admission with current regimen of warfarin 4 mg daily except 2 mg on Sunday with plans to begin 5 mg daily when she finished the '4mg'$  tablets.  Assessment: Pharmacy consulted to dose warfarin for this 66 y.o. female with a medical history significant for left lower extremity DVT and saddle PE and who was admitted with gross hematuria and supratherapeutic INR on 9/25.  Warfarin was held since admission with resolution of hematuria and is to be continued this afternoon following EGD.    Drug Interactions with Warfarin (increased INR):  Cefadroxil Pantoprazole  Today's INR: 1.5 Yesterday's INR: 1.4 Yesterday's warfarin dose: 5 mg   Goal of Therapy:  Anticoagulation Monitor platelets by anticoagulation protocol: Yes   Plan:  Repeat Warfarin 5 mg po x 1 today Monitor daily PT/INR, CBC, signs/symptoms of  bleeding    Thank you for allowing pharmacy to be a part of this patient's care.  Royetta Asal, PharmD, BCPS Clinical Pharmacist [place] Please utilize Amion for appropriate phone number to reach the unit pharmacist () 07/18/2022 7:44 AM

## 2022-07-18 NOTE — ED Provider Notes (Signed)
[facility] DEPT Provider Note   CSN: [id number] Arrival date & time: 07/18/22  1609     History  Chief Complaint  Patient presents with   Fall   Ankle Pain   Weakness    Kristi Meyers is a 66 y.o. female.  HPI Patient presents same day as being discharged from the facility now with concern of pain in her left lower extremity after a fall.  She notes that upon returning home she had mechanical fall.  She recalls the entirety.  She did not strike her head, shoulders, neck and has no pain on the aware other than her left leg, primarily her knee and ankle.  She does describe weakness, substantial, generalized, but has been present since early in her hospitalization.  History is also obtained by the patient's daughter who joins after the initial evaluation.    Home Medications Prior to Admission medications   Medication Sig Start Date End Date Taking? Authorizing Provider  acetaminophen (TYLENOL) 500 MG tablet Take 500 mg by mouth every 6 (six) hours as needed.    [provider]  amLODipine (NORVASC) 5 MG tablet Take 5 mg by mouth daily. 04/15/22   [provider]  atorvastatin (LIPITOR) 40 MG tablet Take 40 mg by mouth at bedtime.     [provider]  carvedilol (COREG) 25 MG tablet Take 25 mg by mouth 2 (two) times daily with a meal.    [provider]  losartan (COZAAR) 100 MG tablet Take 100 mg by mouth daily. 04/30/22   [provider]  meclizine (ANTIVERT) 12.5 MG tablet Take 12.5 mg by mouth 2 (two) times daily.    [provider]  nitroGLYCERIN (NITROSTAT) 0.4 MG SL tablet Place under the tongue. 06/29/22   [provider]  oxyCODONE (OXY IR/ROXICODONE) 5 MG immediate release tablet Take 1 tablet (5 mg total) by mouth every 4 (four) hours as needed for up to 3 days for moderate pain or breakthrough pain. 07/18/22 07/21/22  Barb Merino, MD  pantoprazole (PROTONIX) 40 MG tablet Take 1  tablet (40 mg total) by mouth daily. 07/19/22 08/18/22  Barb Merino, MD  warfarin (COUMADIN) 4 MG tablet Take 4 mg by mouth. 4 mg daily and 2 mg on sunday    [provider]  warfarin (COUMADIN) 5 MG tablet Take 5 mg by mouth daily. 05/12/20   [provider]      Allergies    Patient has no known allergies.    Review of Systems   Review of Systems  All other systems reviewed and are negative.   Physical Exam Updated Vital Signs BP 130/80 (BP Location: Right Arm) Comment: Simultaneous filing. User may not have seen previous data.  Pulse 64 Comment: Simultaneous filing. User may not have seen previous data.  Temp 97.6 F (36.4 C) (Oral)   Resp 18   Ht '5\' 4"'$  (1.626 m)   Wt 85.6 kg   LMP 11/17/2008 (Approximate)   SpO2 95% Comment: Simultaneous filing. User may not have seen previous data.  BMI 32.39 kg/m  Physical Exam Vitals and nursing note reviewed.  Constitutional:      General: She is not in acute distress.    Appearance: She is well-developed.  HENT:     Head: Normocephalic and atraumatic.  Eyes:     Conjunctiva/sclera: Conjunctivae normal.  Cardiovascular:     Rate and Rhythm: Normal rate and regular rhythm.  Pulmonary:     Effort: Pulmonary  effort is normal. No respiratory distress.     Breath sounds: Normal breath sounds. No stridor.  Abdominal:     General: There is no distension.  Musculoskeletal:       Legs:  Skin:    General: Skin is warm and dry.  Neurological:     Mental Status: She is alert and oriented to person, place, and time.     Cranial Nerves: No cranial nerve deficit.  Psychiatric:        Mood and Affect: Mood normal.     ED Results / Procedures / Treatments   Labs (all labs ordered are listed, but only abnormal results are displayed) Labs Reviewed  COMPREHENSIVE METABOLIC PANEL - Abnormal; Notable for the following components:      Result Value   Potassium 5.2 (*)    Glucose, Bld 129 (*)    BUN 50 (*)     Creatinine, Ser 2.35 (*)    Calcium 8.7 (*)    Albumin 3.1 (*)    GFR, Estimated 22 (*)    All other components within normal limits  CBC WITH DIFFERENTIAL/PLATELET - Abnormal; Notable for the following components:   WBC 14.8 (*)    RBC 3.39 (*)    Hemoglobin 9.1 (*)    HCT 29.2 (*)    RDW 15.9 (*)    Neutro Abs 11.6 (*)    Monocytes Absolute 1.5 (*)    Abs Immature Granulocytes 0.08 (*)    All other components within normal limits  URINALYSIS, ROUTINE W REFLEX MICROSCOPIC    EKG None  Radiology DG Knee Complete 4 Views Left  Result Date: 07/18/2022 CLINICAL DATA:  Fell, weakness EXAM: LEFT KNEE - COMPLETE 4+ VIEW COMPARISON:  None Available. FINDINGS: Frontal, bilateral oblique, and cross-table lateral views of the left knee are obtained. No acute fracture, subluxation, or dislocation. 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 No joint effusion. Soft tissues are unremarkable. IMPRESSION: 1. Moderate 3 compartmental osteoarthritis.  No acute fracture. Electronically Signed   By: Randa Ngo M.D.   On: 07/18/2022 17:26   DG Ankle Complete Left  Result Date: 07/18/2022 CLINICAL DATA:  [place], swelling EXAM: LEFT ANKLE COMPLETE - 3+ VIEW COMPARISON:  None Available. FINDINGS: Frontal, oblique, and lateral views of the left ankle are obtained. There are no acute displaced fractures. Alignment is anatomic. Mild osteoarthritis of the ankle and hindfoot. Mild anterior soft tissue swelling. IMPRESSION: 1. Mild anterior soft tissue swelling.  No acute displaced fracture. Electronically Signed   By: Randa Ngo M.D.   On: 07/18/2022 17:25    Procedures Procedures    Medications Ordered in ED Medications - No data to display  ED Course/ Medical Decision Making/ A&P This patient with a Hx of recent admission for abdominal pain, with diagnosis of bilateral DVT, renal collecting system obstruction presents to the ED for concern of weakness, fall, this involves an extensive number  of treatment options, and is a complaint that carries with it a high risk of complications and morbidity.    The differential diagnosis includes both considerations of weakness, including infection, dehydration, renal dysfunction, medication interactions and sequelae of trauma, including fractures.  Patient did not hit her head, though she is on Coumadin, she also has no neurodeficits.   Social Determinants of Health:  Advanced age, medical problems  Additional history obtained:  Additional history and/or information obtained from daughter at bedside, details included above.   After the initial evaluation, orders, including: X-rays, labs were initiated.  On repeat evaluation of the patient stayed the same  Lab Tests:  I personally interpreted labs.  The pertinent results include: Evidence for acute kidney injury with creatinine now 2.35, 6 days after being normal.  BUN 50.  Potassium 5.2. Imaging Studies ordered:  I independently visualized and interpreted imaging which showed no fracture of the knee, ankle I agree with the radiologist interpretation   Dispostion / Final MDM:  After consideration of the diagnostic results and the patient's response to treatment, this adult female presents on the same day as discharge, with concern for weakness, and pain in her left lower extremity following a fall.  Patient is awake and alert, neurovascularly intact in her distal extremities, does have pain as above.  Patient has no fractures, and no evidence for acute traumatic findings.  However, patient has evidence for acute kidney injury, possibly due to the renal collecting system lesions identified on CT scan earlier this week.  Patient received fluid resuscitation, was admitted for further monitoring, management.   Final Clinical Impression(s) / ED Diagnoses Final diagnoses:  Fall, initial encounter  AKI (acute kidney injury) [facility])     Carmin Muskrat, MD 07/18/22 [phone]

## 2022-07-18 NOTE — Assessment & Plan Note (Signed)
On coumadin for VTE prevention. Had saddle PE in 2019 requiring EKOS

## 2022-07-18 NOTE — Plan of Care (Signed)
Discharge instructions reviewed with patient and daughter, questions answered, verbalized understanding.  Patient transported via wheelchair to main entrance to be taken home by daughter.

## 2022-07-19 ENCOUNTER — Encounter (HOSPITAL_COMMUNITY): Payer: Self-pay | Admitting: Gastroenterology

## 2022-07-19 DIAGNOSIS — I10 Essential (primary) hypertension: Secondary | ICD-10-CM | POA: Diagnosis not present

## 2022-07-19 DIAGNOSIS — Z86711 Personal history of pulmonary embolism: Secondary | ICD-10-CM | POA: Diagnosis not present

## 2022-07-19 DIAGNOSIS — N179 Acute kidney failure, unspecified: Secondary | ICD-10-CM | POA: Diagnosis not present

## 2022-07-19 DIAGNOSIS — E785 Hyperlipidemia, unspecified: Secondary | ICD-10-CM | POA: Diagnosis not present

## 2022-07-19 LAB — COMPREHENSIVE METABOLIC PANEL
ALT: 14 U/L (ref 0–44)
AST: 14 U/L — ABNORMAL LOW (ref 15–41)
Albumin: 2.7 g/dL — ABNORMAL LOW (ref 3.5–5.0)
Alkaline Phosphatase: 55 U/L (ref 38–126)
Anion gap: 4 — ABNORMAL LOW (ref 5–15)
BUN: 42 mg/dL — ABNORMAL HIGH (ref 8–23)
CO2: 24 mmol/L (ref 22–32)
Calcium: 8.1 mg/dL — ABNORMAL LOW (ref 8.9–10.3)
Chloride: 113 mmol/L — ABNORMAL HIGH (ref 98–111)
Creatinine, Ser: 1.71 mg/dL — ABNORMAL HIGH (ref 0.44–1.00)
GFR, Estimated: 32 mL/min — ABNORMAL LOW (ref >60)
Glucose, Bld: 96 mg/dL (ref 70–99)
Potassium: 4.1 mmol/L (ref 3.5–5.1)
Sodium: 141 mmol/L (ref 135–145)
Total Bilirubin: 0.8 mg/dL (ref 0.3–1.2)
Total Protein: 6.3 g/dL — ABNORMAL LOW (ref 6.5–8.1)

## 2022-07-19 LAB — SURGICAL PATHOLOGY

## 2022-07-19 LAB — CBC WITH DIFFERENTIAL/PLATELET
Abs Immature Granulocytes: 0.11 10*3/uL — ABNORMAL HIGH (ref 0.00–0.07)
Basophils Absolute: 0.1 10*3/uL (ref 0.0–0.1)
Basophils Relative: 0 %
Eosinophils Absolute: 0.1 10*3/uL (ref 0.0–0.5)
Eosinophils Relative: 1 %
HCT: 25.7 % — ABNORMAL LOW (ref 36.0–46.0)
Hemoglobin: 8 g/dL — ABNORMAL LOW (ref 12.0–15.0)
Immature Granulocytes: 1 %
Lymphocytes Relative: 16 %
Lymphs Abs: 2.3 10*3/uL (ref 0.7–4.0)
MCH: 26.9 pg (ref 26.0–34.0)
MCHC: 31.1 g/dL (ref 30.0–36.0)
MCV: 86.5 fL (ref 80.0–100.0)
Monocytes Absolute: 1.6 10*3/uL — ABNORMAL HIGH (ref 0.1–1.0)
Monocytes Relative: 12 %
Neutro Abs: 9.9 10*3/uL — ABNORMAL HIGH (ref 1.7–7.7)
Neutrophils Relative %: 70 %
Platelets: 206 10*3/uL (ref 150–400)
RBC: 2.97 MIL/uL — ABNORMAL LOW (ref 3.87–5.11)
RDW: 16 % — ABNORMAL HIGH (ref 11.5–15.5)
WBC: 14.1 10*3/uL — ABNORMAL HIGH (ref 4.0–10.5)
nRBC: 0 % (ref 0.0–0.2)

## 2022-07-19 LAB — PROTIME-INR
INR: 2 — ABNORMAL HIGH (ref 0.8–1.2)
Prothrombin Time: 22.5 seconds — ABNORMAL HIGH (ref 11.4–15.2)

## 2022-07-19 LAB — HIV ANTIBODY (ROUTINE TESTING W REFLEX): HIV Screen 4th Generation wRfx: NONREACTIVE

## 2022-07-19 MED ORDER — SODIUM CHLORIDE 0.9 % IV SOLN
1.0000 g | INTRAVENOUS | Status: DC
  Administered 2022-07-19 – 2022-07-20 (×2): 1 g via INTRAVENOUS
  Filled 2022-07-19 (×3): qty 10

## 2022-07-19 MED ORDER — SODIUM CHLORIDE 0.9 % IV SOLN: INTRAVENOUS | Status: AC

## 2022-07-19 NOTE — Evaluation (Signed)
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  Patient was discharged today around 1 PM.  Patient had been hospitalized from 9/25 through 07/18/2022 due to gross hematuria and acute blood loss anemia.     When she got home with her daughter, patient was trying to get into her house.  She fell outside the front door of her house.  Patient brought back to the ER  Clinical Impression  Pt admitted with above diagnosis. Pt required +2 max assist for supine to sit, +2 min to mod assist to take a few pivotal steps to recliner with RW, pt had buckling of R knee during transfer and required +2 mod A to control descent to recliner. She is not mobilizing well enough to DC home. Pt reports pain L lateral ankle, stated it rolled inward during her fall yesterday, noted imaging negative for fracture, L ankle brace ordered.  Pt currently with functional limitations due to the deficits listed below (see PT Problem List). Pt will benefit from skilled PT to increase their independence and safety with mobility to allow discharge to the venue listed below.          Recommendations for follow up therapy are one component of a multi-disciplinary discharge planning process, led by the attending physician.  Recommendations may be updated based on patient status, additional functional criteria and insurance authorization.  Follow Up Recommendations Skilled nursing-short term rehab (<3 hours/day) Can patient physically be transported by private vehicle: No    Assistance Recommended at Discharge Frequent or constant Supervision/Assistance  Patient can return home with the following  Two people to help with walking and/or  transfers;A lot of help with bathing/dressing/bathroom;Assistance with cooking/housework;Assist for transportation;Help with stairs or ramp for entrance    Equipment Recommendations None recommended by PT  Recommendations for Other Services       Functional Status Assessment Patient has had a recent decline in their functional status and demonstrates the ability to make significant improvements in function in a reasonable and predictable amount of time.     Precautions / Restrictions Precautions Precautions: Fall Precaution Comments: fell on day of re-admission; R knee buckles, L ankle painful 2* sprain Restrictions Weight Bearing Restrictions: No      Mobility  Bed Mobility Overal bed mobility: Needs Assistance Bed Mobility: Supine to Sit     Supine to sit: Max assist, +2 for physical assistance     General bed mobility comments: assist to raise trunk, advance LLE and pivot hips    Transfers Overall transfer level: Needs assistance Equipment used: Rolling walker (2 wheels) Transfers: Sit to/from Stand, Bed to chair/wheelchair/BSC Sit to Stand: +2 safety/equipment, Mod assist, From elevated surface   Step pivot transfers: Mod assist, +2 safety/equipment       General transfer comment: pt able to take a few pivotal steps from bed to recliner with +2 min A before R knee buckled and pt required +2 mod assist to guide hips into recliner; VCs for technique    Ambulation/Gait Ambulation/Gait assistance: +2 physical assistance, +2 safety/equipment, Min assist, Mod assist Gait Distance (Feet): 3 Feet Assistive device: Rolling walker (2 wheels) Gait Pattern/deviations: Step-to pattern, Decreased step length - right, Decreased step length - left, Decreased weight shift to left  General Gait Details: see notes above for SPT  Stairs            Wheelchair Mobility    Modified Rankin (Stroke Patients Only)       Balance Overall balance assessment: Needs  assistance, History of Falls Sitting-balance support: Feet supported, No upper extremity supported Sitting balance-Leahy Scale: Fair     Standing balance support: Bilateral upper extremity supported, During functional activity Standing balance-Leahy Scale: Poor                               Pertinent Vitals/Pain Pain Assessment Pain Assessment: 0-10 Pain Score: 9  Pain Location: L ankle with movement Pain Descriptors / Indicators: Grimacing, Guarding, Sore Pain Intervention(s): Limited activity within patient's tolerance, Monitored during session, Patient requesting pain meds-RN notified, RN gave pain meds during session    Home Living Family/patient expects to be discharged to:: Private residence Living Arrangements: Alone Available Help at Discharge: Family;Available PRN/intermittently   Home Access: Stairs to enter   Entrance Stairs-Number of Steps: 1   Home Layout: One level Home Equipment: Cane - single Barista (2 wheels);BSC/3in1      Prior Function Prior Level of Function : History of Falls (last six months)             Mobility Comments: used a walker at home, used scooter at grocery store       Hand Dominance        Extremity/Trunk Assessment   Upper Extremity Assessment Upper Extremity Assessment: Defer to OT evaluation    Lower Extremity Assessment Lower Extremity Assessment: LLE deficits/detail;RLE deficits/detail RLE Deficits / Details: pt reports she needs a R TKA, knee ext +4/5, but R knee buckled in standing (pt reports this was happening at home) RLE Sensation: WNL RLE Coordination: WNL LLE Deficits / Details: edema noted lateral ankle, pt very tender/painful lateral ankle, reports she L ankle rolled inward during fall yesterday (no fx per imaging); ankle PF/DF AROM decr ~50% LLE: Unable to fully assess due to pain LLE Sensation: WNL    Cervical / Trunk Assessment Cervical / Trunk Assessment: Normal   Communication   Communication: No difficulties  Cognition Arousal/Alertness: Awake/alert Behavior During Therapy: WFL for tasks assessed/performed Overall Cognitive Status: Within Functional Limits for tasks assessed                                          General Comments      Exercises General Exercises - Lower Extremity Ankle Circles/Pumps: AROM, Left, 10 reps, Supine   Assessment/Plan    PT Assessment Patient needs continued PT services  PT Problem List Decreased range of motion;Decreased strength;Decreased mobility;Decreased activity tolerance;Pain;Decreased balance       PT Treatment Interventions Gait training;Therapeutic exercise;Therapeutic activities;Functional mobility training;Patient/family education    PT Goals (Current goals can be found in the Care Plan section)  Acute Rehab PT Goals Patient Stated Goal: to get stronger PT Goal Formulation: With patient Time For Goal Achievement: 08/02/22 Potential to Achieve Goals: Fair    Frequency Min 2X/week     Co-evaluation PT/OT/SLP Co-Evaluation/Treatment: Yes Reason for Co-Treatment: For patient/therapist safety;To address functional/ADL transfers PT goals addressed during session: Mobility/safety with mobility;Balance;Proper use of DME;Strengthening/ROM         AM-PAC PT "6 Clicks" Mobility  Outcome Measure Help needed turning from your  back to your side while in a flat bed without using bedrails?: A Lot Help needed moving from lying on your back to sitting on the side of a flat bed without using bedrails?: Total Help needed moving to and from a bed to a chair (including a wheelchair)?: Total Help needed standing up from a chair using your arms (e.g., wheelchair or bedside chair)?: Total Help needed to walk in hospital room?: Total Help needed climbing 3-5 steps with a railing? : Total 6 Click Score: 7    End of Session Equipment Utilized During Treatment: Gait belt Activity  Tolerance: Patient limited by pain;Patient limited by fatigue Patient left: in chair;with call bell/phone within reach;with nursing/sitter in room Nurse Communication: Mobility status PT Visit Diagnosis: Difficulty in walking, not elsewhere classified (R26.2);Pain;History of falling (Z91.81) Pain - Right/Left: Left Pain - part of body: Ankle and joints of foot    Time: 3734-2876 PT Time Calculation (min) (ACUTE ONLY): 23 min   Charges:   PT Evaluation $PT Eval Moderate Complexity: 1 Mod          Philomena Doheny PT 07/19/2022  Acute Rehabilitation Services  Office [phone]

## 2022-07-19 NOTE — Progress Notes (Signed)
PROGRESS NOTE    Kristi Meyers  LKG:[id number] DOB: [birth year] DOA: 07/18/2022 PCP: Billie Ruddy, MD   Brief Narrative:  HPI per Dr. 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  Patient was discharged today around 1 PM.  Patient had been hospitalized from 9/25 through 07/18/2022 due to gross hematuria and acute blood loss anemia.   When she got home with her daughter, patient was trying to get into her house.  She fell outside the front door of her house.  Daughter [name]) states that patient got weak in her legs and could not support her weight.  She did not hit her head.  Patient brought back to the ER at 4:20 PM.   On arrival temp 97.6 heart rate 64 blood pressure 130/80 satting 95% on room air.   Labs show sodium 138, potassium 5.2, BUN of 50, creatinine 2.35   Last chemistry that was done in the hospital on 07/11/2022 showed a BUN of 21, creatinine 0.95.   CBC showed a white count of 14.8, hemoglobin 9.1, platelets 223   Left ankle x-ray negative for fracture.   Due to AKI and patient inability to walk, [facility] hospital[facility] contacted for admission.    **Interim History     Assessment and Plan: * AKI (acute kidney injury) ([place]) -Admit to med/surg bed. Continue with IVF. Repeat BMP. Likely due to poor po intake. -Patient is BUN/creatinine has gone from 50/2.35 is now 42/1.71 -Avoid further nephrotoxic medications, contrast dyes, hypotension and dehydration to ensure adequate renal perfusion and will need to renally dose medications -Continue IV fluid hydration with normal saline at 75 MLS per hour for 1 day -Continue to monitor and trend renal function carefully repeat CMP in the a.m. -We will repeat CTA of the abdomen pelvis if renal function improves further and will need to discuss with interventional  radiology  Impaired ambulation -Does not appear that pt walked with PT during her 7 day hospital stay from 9-25 to 10-3. Will need PT consult. TOC consult for SNF placement. Dtr(tiffany) says pt was already using walker(in the house) and cane(outside the house) for assistance. Mostly for balance. -PT OT recommending SNF now I have ordered her a left ankle brace given that the patient has pain left lateral ankle and she stated that she rolled it inward during her fall yesterday  Long term (current) use of anticoagulants - 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 Could not afford Eliquis. -Remains on coumadin. Could not afford Eliquis. Pharmacy consult for inr management and will hold Coumadin given that she still have some hematuria  Hyperlipidemia, unspecified -Continue with lipitor 40 mg qhs.  Gross hematuria -Holding her anticoagulation -Discussed with urology who recommends IR consultation for possible embolectomy versus evaluation for possible repeat CT a scan to localize bleeding -Per urology no need for Foley catheter at this time and monitor closely  Recent E. coli UTI -Discussed with urology who recommends extending antibiotic course and we have initiated her back on IV ceftriaxone -Repeat urinalysis and urine culture pending and urinalysis could not be tested given blood in the urine  Essential hypertension Stable. Pt was not receiving losartan during her hospital stay 9-25 to 10-3.  Likely her AKI due to poor po liquid intake and we will continue to hold naproxen medications -Continue monitor blood pressures per protocol  ABLA (acute blood loss anemia) -S/p EGD on 07-17-2022 that was negative as source of acute blood loss anemia that pt was initially admitted for 07-10-2022. -CT scan done last admission and showed "Redemonstrated high-density nonenhancing filling defect throughout the right renal collecting system and renal pelvis, unchanged to slightly progressed compared to the  07/12/2022 examination and while indeterminate is favored to represent blood clot, the etiology of which is again not depicted on the present examination. Further evaluation with ureteroscopy could be performed as indicated. Mild right-sided pelvicaliectasis without asymmetrically delayed renal enhancement. 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 Abdominal Aortic Aneurysm.  Recommend follow-up aortic ultrasound in 2 years. Suspected hemodynamically significant narrowings involving left common iliac artery as well as the bilateral external iliac and imaged portions of the bilateral superficial femoral arteries. Correlation for symptoms of PAD is advised. Aortic Atherosclerosis. Previously questioned masslike area involving the greater curvature of the stomach is again not demonstrated on the present examination and thus may be artifactual due to associated diaphragmatic hernia/eventration."  History of pulmonary embolus (PE) - Had saddle PE in 2019 requiring EKOS -On coumadin for VTE prevention. Had saddle PE in 2019 requiring EKOS -INR was 2.0 but will hold Coumadin given her gross hematuria and bleeding  Hypoalbuminemia -Patient's albumin level of 3.1 is now 2.7 -Continue to monitor trend and repeat CMP in a.m.  Obesity -Complicates overall prognosis and care -Estimated body mass index is 33.57 kg/m as calculated from the following:   Height as of this encounter: '5\' 4"'$  (1.626 m).   Weight as of this encounter: 88.7 kg.  -Weight Loss and Dietary Counseling given  DVT prophylaxis: SCDs Start: 07/18/22 2236    Code Status: Full Code Family Communication: No family currently at bedside  Disposition Plan:  Level of care: Med-Surg Status is: Inpatient Remains inpatient appropriate because: He is further clinical improvement and PT OT recommending SNF   Consultants:  Discussed with urology Dr. Jeffie Pollock who  recommended IR evaluation also recommend urology Dr. Lovena Neighbours to see in the morning  Procedures:  As above  Antimicrobials:  Anti-infectives (From admission, onward)    None       Subjective: Seen and examined at bedside and was still having some flank pain and also having some abdominal discomfort.  States that she rolled her ankle yesterday and that her left leg was hurting.  No nausea or vomiting.  Denies any chest pain or shortness breath.  Still feels pretty weak.  No other concerns or complaints at this time.  Objective: Vitals:   07/19/22 0535 07/19/22 0845 07/19/22 1031 07/19/22 1349  BP: 101/67 111/82 122/78 134/88  Pulse: 76 70 66 72  Resp: '12  16 18  '$ Temp: 98.7 F (37.1 C)  98.6 F (37 C) 98.5 F (36.9 C)  TempSrc: Oral  Oral Oral  SpO2: 94%  96% 98%  Weight:      Height:        Intake/Output Summary (Last 24 hours) at 07/19/2022 1917 Last data filed at 07/19/2022 1800 Gross per 24 hour  Intake 1916.35 ml  Output 1300 ml  Net 616.35 ml   Filed Weights   07/18/22 1624 07/19/22 0500  Weight: 85.6 kg 88.7 kg   Examination: Physical Exam:  Constitutional: WN/WD obese African-American female, in no acute distress Respiratory: Diminished to auscultation bilaterally, no wheezing, rales, rhonchi or crackles. Normal respiratory effort and patient is not tachypenic. No accessory muscle use.  Unlabored breathing Cardiovascular: RRR, no murmurs / rubs / gallops. S1 and S2 auscultated.  Has a mild lower extremity edema Abdomen: Soft, non-tender, distended secondary body habitus. Bowel sounds positive.  GU: Deferred. Musculoskeletal: No clubbing / cyanosis of digits/nails. No joint deformity upper and lower extremities.  Skin: No rashes, lesions, ulcers on limited skin evaluation. No induration; Warm and dry.  Neurologic: CN 2-12 grossly intact with no focal deficits. Romberg sign and cerebellar reflexes not assessed.  Psychiatric: Normal judgment and insight. Alert and  oriented x 3. Normal mood and appropriate affect.   Data Reviewed: I have personally reviewed following labs and imaging studies  CBC: Recent Labs  Lab 07/15/22 0523 07/16/22 0511 07/17/22 0423 07/18/22 1728 07/19/22 0448  WBC 15.8* 14.2* 13.1* 14.8* 14.1*  NEUTROABS 11.0* 9.7* 8.8* 11.6* 9.9*  HGB 8.8* 8.5* 8.9* 9.1* 8.0*  HCT 28.4* 26.9* 28.7* 29.2* 25.7*  MCV 87.7 85.7 86.7 86.1 86.5  PLT 253 228 233 223 756   Basic Metabolic Panel: Recent Labs  Lab 07/18/22 1728 07/19/22 0448  NA 138 141  K 5.2* 4.1  CL 109 113*  CO2 24 24  GLUCOSE 129* 96  BUN 50* 42*  CREATININE 2.35* 1.71*  CALCIUM 8.7* 8.1*   GFR: Estimated Creatinine Clearance: 34 mL/min (A) (by C-G formula based on SCr of 1.71 mg/dL (H)). Liver Function Tests: Recent Labs  Lab 07/18/22 1728 07/19/22 0448  AST 16 14*  ALT 16 14  ALKPHOS 61 55  BILITOT 0.8 0.8  PROT 7.0 6.3*  ALBUMIN 3.1* 2.7*   No results for input(s): "LIPASE", "AMYLASE" in the last 168 hours. No results for input(s): "AMMONIA" in the last 168 hours. Coagulation Profile: Recent Labs  Lab 07/15/22 0523 07/16/22 0511 07/17/22 0423 07/18/22 0403 07/19/22 0448  INR 2.6* 1.7* 1.4* 1.5* 2.0*   Cardiac Enzymes: No results for input(s): "CKTOTAL", "CKMB", "CKMBINDEX", "TROPONINI" in the last 168 hours. BNP (last 3 results) No results for input(s): "PROBNP" in the last 8760 hours. HbA1C: No results for input(s): "HGBA1C" in the last 72 hours. CBG: No results for input(s): "GLUCAP" in the last 168 hours. Lipid Profile: No results for input(s): "CHOL", "HDL", "LDLCALC", "TRIG", "CHOLHDL", "LDLDIRECT" in the last 72 hours. Thyroid Function Tests: No results for input(s): "TSH", "T4TOTAL", "FREET4", "T3FREE", "THYROIDAB" in the last 72 hours. Anemia Panel: No results for input(s): "VITAMINB12", "FOLATE", "FERRITIN", "TIBC", "IRON", "RETICCTPCT" in the last 72 hours. Sepsis Labs: No results for input(s): "PROCALCITON",  "LATICACIDVEN" in the last 168 hours.  Recent Results (from the past 240 hour(s))  Urine Culture     Status: Abnormal   Collection Time: 07/11/22  1:00 AM   Specimen: In/Out Cath Urine  Result Value Ref Range Status   Specimen Description   Final    IN/OUT CATH URINE Performed at [facility] [address] [place], [place] [address]    Special Requests   Final    NONE Performed at [facility], [place] [address] [place], [place] [address]    Culture >=100,000 COLONIES/mL ESCHERICHIA COLI (A)  Final   Report Status 07/13/2022 FINAL  Final   Organism ID, Bacteria ESCHERICHIA COLI (A)  Final      Susceptibility   Escherichia coli - MIC*    AMPICILLIN <=2 SENSITIVE Sensitive     CEFAZOLIN <=4 SENSITIVE Sensitive     CEFEPIME <=0.12 SENSITIVE Sensitive     CEFTRIAXONE <=0.25 SENSITIVE Sensitive     CIPROFLOXACIN <=0.25 SENSITIVE Sensitive  GENTAMICIN <=1 SENSITIVE Sensitive     IMIPENEM <=0.25 SENSITIVE Sensitive     NITROFURANTOIN <=16 SENSITIVE Sensitive     TRIMETH/SULFA <=20 SENSITIVE Sensitive     AMPICILLIN/SULBACTAM <=2 SENSITIVE Sensitive     PIP/TAZO <=4 SENSITIVE Sensitive     * >=100,000 COLONIES/mL ESCHERICHIA COLI     Radiology Studies: DG Knee Complete 4 Views Left  Result Date: 07/18/2022 CLINICAL DATA:  Fell, weakness EXAM: LEFT KNEE - COMPLETE 4+ VIEW COMPARISON:  None Available. FINDINGS: Frontal, bilateral oblique, and cross-table lateral views of the left knee are obtained. No acute fracture, subluxation, or dislocation. 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 No joint effusion. Soft tissues are unremarkable. IMPRESSION: 1. Moderate 3 compartmental osteoarthritis.  No acute fracture. Electronically Signed   By: Randa Ngo M.D.   On: 07/18/2022 17:26   DG Ankle Complete Left  Result Date: 07/18/2022 CLINICAL DATA:  [place], swelling EXAM: LEFT ANKLE COMPLETE - 3+ VIEW COMPARISON:  None Available. FINDINGS:  Frontal, oblique, and lateral views of the left ankle are obtained. There are no acute displaced fractures. Alignment is anatomic. Mild osteoarthritis of the ankle and hindfoot. Mild anterior soft tissue swelling. IMPRESSION: 1. Mild anterior soft tissue swelling.  No acute displaced fracture. Electronically Signed   By: Randa Ngo M.D.   On: 07/18/2022 17:25    Scheduled Meds:  atorvastatin  40 mg Oral QHS   carvedilol  25 mg Oral BID WC   pantoprazole  40 mg Oral Daily   Continuous Infusions:  sodium chloride 75 mL/hr at 07/19/22 1143    LOS: 1 day   Raiford Noble, DO [facility] Available via Epic secure chat 7am-7pm After these hours, please refer to coverage provider listed on [email or web address] 07/19/2022, 7:17 PM

## 2022-07-19 NOTE — ED Notes (Signed)
Called patient's daughter, Darcey Nora, to let her know that her Mom is being transferred to bed 1321. She stated she really appreciated my phone call.

## 2022-07-19 NOTE — Progress Notes (Signed)
Orthopedic Tech Progress Note Patient Details:  Kristi Meyers Jan 31, 1956 [id number]  Patient ID: Kristi Meyers, female   DOB: April 15, 1956, 66 y.o.   MRN: [id number]  Kristi Meyers 07/19/2022, 2:07 PM Left aso ankle brace applied

## 2022-07-19 NOTE — Evaluation (Signed)
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  Patient was discharged today around 1 PM.  Patient had been hospitalized from 9/25 through 07/18/2022 due to gross hematuria and acute blood loss anemia.     When she got home with her daughter, patient was trying to get into her house.  She fell outside the front door of her house.  Patient brought back to the ER   Clinical Impression   Ms. Kristi Meyers is a 66 year old woman admitted with above medical history who know presents with left ankle pain, a right knee that buckles, decreased activity tolerance, generalized weakness and impaired balance. Prior to initial evaluation patient lived home alone and was independent. Now patient needing +2 assist for transfers, seated position for UB ADLs and mod -max assist for Lb bathing, dressing and toileting. Patient will benefit from skilled OT services while in hospital to improve deficits and learn compensatory strategies as needed in order to return to PLOF.  Recommend short term rehab at discharge prior to return home.       Recommendations for follow up therapy are one component of a multi-disciplinary discharge planning process, led by the attending physician.  Recommendations may be updated based on patient status, additional functional criteria and insurance authorization.   Follow Up Recommendations  Skilled nursing-short term rehab (<3 hours/day)    Assistance Recommended at Discharge Frequent or constant Supervision/Assistance  Patient can return home with the following A lot of help with walking and/or transfers;A lot of help with bathing/dressing/bathroom;Assistance with  cooking/housework;Assist for transportation;Help with stairs or ramp for entrance    Functional Status Assessment  Patient has had a recent decline in their functional status and demonstrates the ability to make significant improvements in function in a reasonable and predictable amount of time.  Equipment Recommendations  Other (comment) (defer to next venue)    Recommendations for Other Services       Precautions / Restrictions Precautions Precautions: Fall Precaution Comments: fell on day of re-admission; R knee buckles, L ankle painful 2* sprain Restrictions Weight Bearing Restrictions: No      Mobility Bed Mobility Overal bed mobility: Needs Assistance Bed Mobility: Supine to Sit     Supine to sit: Max assist, +2 for physical assistance     General bed mobility comments: assist to raise trunk, advance LLE and pivot hips    Transfers Overall transfer level: Needs assistance Equipment used: Rolling walker (2 wheels) Transfers: Sit to/from Stand, Bed to chair/wheelchair/BSC Sit to Stand: +2 safety/equipment, Mod assist, From elevated surface     Step pivot transfers: Mod assist, +2 safety/equipment     General transfer comment: pt able to take a few pivotal steps from bed to recliner with +2 min A before R knee buckled and pt required +2 mod assist to guide hips into recliner; VCs for technique      Balance Overall balance assessment: Needs assistance Sitting-balance support: No upper extremity supported, Feet supported Sitting balance-Leahy Scale: Fair     Standing balance support: Bilateral upper extremity supported, During functional activity Standing balance-Leahy Scale: Poor  ADL either performed or assessed with clinical judgement   ADL Overall ADL's : Needs assistance/impaired Eating/Feeding: Set up;Sitting   Grooming: Set up;Sitting   Upper Body Bathing: Set up;Sitting   Lower Body Bathing: Moderate  assistance;Sitting/lateral leans   Upper Body Dressing : Set up;Sitting   Lower Body Dressing: Maximal assistance;Sit to/from stand       Toileting- Water quality scientist and Hygiene: +2 for physical assistance;Maximal assistance;Sit to/from stand   Tub/ Banker: Moderate assistance;+2 for physical assistance;BSC/3in1;Rolling walker (2 wheels)   Functional mobility during ADLs: Moderate assistance;+2 for physical assistance;+2 for safety/equipment;Rolling walker (2 wheels)       Vision Patient Visual Report: No change from baseline       Perception     Praxis      Pertinent Vitals/Pain Pain Assessment Pain Assessment: 0-10 Pain Score: 9  Pain Location: L ankle with movement Pain Descriptors / Indicators: Grimacing, Guarding, Sore Pain Intervention(s): Limited activity within patient's tolerance, Repositioned     Hand Dominance Right   Extremity/Trunk Assessment Upper Extremity Assessment Upper Extremity Assessment: Overall WFL for tasks assessed (5/5 strength)   Lower Extremity Assessment Lower Extremity Assessment: Defer to PT evaluation   Cervical / Trunk Assessment Cervical / Trunk Assessment: Normal   Communication Communication Communication: No difficulties   Cognition Arousal/Alertness: Awake/alert Behavior During Therapy: WFL for tasks assessed/performed Overall Cognitive Status: Within Functional Limits for tasks assessed                                       General Comments       Exercises     Shoulder Instructions      Home Living Family/patient expects to be discharged to:: Skilled nursing facility Living Arrangements: Alone Available Help at Discharge: Family;Available PRN/intermittently   Home Access: Stairs to enter Entrance Stairs-Number of Steps: 1   Home Layout: One level               Home Equipment: Cane - single Barista (2 wheels);BSC/3in1          Prior  Functioning/Environment Prior Level of Function : History of Falls (last six months)             Mobility Comments: used a walker at home, used scooter at grocery store          OT Problem List: Decreased strength;Decreased range of motion;Decreased activity tolerance;Impaired balance (sitting and/or standing);Obesity;Pain;Decreased knowledge of use of DME or AE      OT Treatment/Interventions: Self-care/ADL training;Therapeutic exercise;DME and/or AE instruction;Therapeutic activities;Balance training;Patient/family education    OT Goals(Current goals can be found in the care plan section) Acute Rehab OT Goals Patient Stated Goal: return to being independent OT Goal Formulation: With patient Time For Goal Achievement: 08/02/22 Potential to Achieve Goals: Good  OT Frequency: Min 2X/week    Co-evaluation   Reason for Co-Treatment: For patient/therapist safety;To address functional/ADL transfers          AM-PAC OT "6 Clicks" Daily Activity     Outcome Measure Help from another person eating meals?: None Help from another person taking care of personal grooming?: A Little Help from another person toileting, which includes using toliet, bedpan, or urinal?: A Lot Help from another person bathing (including washing, rinsing, drying)?: A Lot Help from another person to put on and taking off regular upper body clothing?: A Little Help from another person to put  on and taking off regular lower body clothing?: A Lot 6 Click Score: 16   End of Session Equipment Utilized During Treatment: Gait belt;Rolling walker (2 wheels) Nurse Communication: Mobility status  Activity Tolerance: Patient tolerated treatment well Patient left: in chair;with call bell/phone within reach;with chair alarm set;with nursing/sitter in room  OT Visit Diagnosis: Pain                Time: 1660-6004 OT Time Calculation (min): 18 min Charges:  OT General Charges $OT Visit: 1 Visit OT Evaluation $OT  Eval Low Complexity: 1 Low  Gustavo Lah, OTR/L [place]  Office [phone]   Lenward Chancellor 07/19/2022, 4:51 PM

## 2022-07-20 DIAGNOSIS — N179 Acute kidney failure, unspecified: Secondary | ICD-10-CM | POA: Diagnosis not present

## 2022-07-20 DIAGNOSIS — D62 Acute posthemorrhagic anemia: Secondary | ICD-10-CM | POA: Diagnosis not present

## 2022-07-20 DIAGNOSIS — Z86711 Personal history of pulmonary embolism: Secondary | ICD-10-CM | POA: Diagnosis not present

## 2022-07-20 DIAGNOSIS — I10 Essential (primary) hypertension: Secondary | ICD-10-CM | POA: Diagnosis not present

## 2022-07-20 LAB — CBC WITH DIFFERENTIAL/PLATELET
Abs Immature Granulocytes: 0.25 10*3/uL — ABNORMAL HIGH (ref 0.00–0.07)
Basophils Absolute: 0.1 10*3/uL (ref 0.0–0.1)
Basophils Relative: 1 %
Eosinophils Absolute: 0.2 10*3/uL (ref 0.0–0.5)
Eosinophils Relative: 2 %
HCT: 26.3 % — ABNORMAL LOW (ref 36.0–46.0)
Hemoglobin: 8.1 g/dL — ABNORMAL LOW (ref 12.0–15.0)
Immature Granulocytes: 2 %
Lymphocytes Relative: 21 %
Lymphs Abs: 2.6 10*3/uL (ref 0.7–4.0)
MCH: 26.7 pg (ref 26.0–34.0)
MCHC: 30.8 g/dL (ref 30.0–36.0)
MCV: 86.8 fL (ref 80.0–100.0)
Monocytes Absolute: 1.3 10*3/uL — ABNORMAL HIGH (ref 0.1–1.0)
Monocytes Relative: 11 %
Neutro Abs: 7.8 10*3/uL — ABNORMAL HIGH (ref 1.7–7.7)
Neutrophils Relative %: 63 %
Platelets: 228 10*3/uL (ref 150–400)
RBC: 3.03 MIL/uL — ABNORMAL LOW (ref 3.87–5.11)
RDW: 16 % — ABNORMAL HIGH (ref 11.5–15.5)
WBC: 12.2 10*3/uL — ABNORMAL HIGH (ref 4.0–10.5)
nRBC: 0 % (ref 0.0–0.2)

## 2022-07-20 LAB — IRON AND TIBC
Iron: 18 ug/dL — ABNORMAL LOW (ref 28–170)
Saturation Ratios: 9 % — ABNORMAL LOW (ref 10.4–31.8)
TIBC: 197 ug/dL — ABNORMAL LOW (ref 250–450)
UIBC: 179 ug/dL

## 2022-07-20 LAB — FOLATE: Folate: 6.7 ng/mL (ref >5.9)

## 2022-07-20 LAB — COMPREHENSIVE METABOLIC PANEL
ALT: 15 U/L (ref 0–44)
AST: 14 U/L — ABNORMAL LOW (ref 15–41)
Albumin: 2.6 g/dL — ABNORMAL LOW (ref 3.5–5.0)
Alkaline Phosphatase: 53 U/L (ref 38–126)
Anion gap: 7 (ref 5–15)
BUN: 25 mg/dL — ABNORMAL HIGH (ref 8–23)
CO2: 23 mmol/L (ref 22–32)
Calcium: 8.2 mg/dL — ABNORMAL LOW (ref 8.9–10.3)
Chloride: 115 mmol/L — ABNORMAL HIGH (ref 98–111)
Creatinine, Ser: 1.1 mg/dL — ABNORMAL HIGH (ref 0.44–1.00)
GFR, Estimated: 55 mL/min — ABNORMAL LOW (ref >60)
Glucose, Bld: 92 mg/dL (ref 70–99)
Potassium: 3.8 mmol/L (ref 3.5–5.1)
Sodium: 145 mmol/L (ref 135–145)
Total Bilirubin: 0.7 mg/dL (ref 0.3–1.2)
Total Protein: 6.2 g/dL — ABNORMAL LOW (ref 6.5–8.1)

## 2022-07-20 LAB — PHOSPHORUS: Phosphorus: 2.2 mg/dL — ABNORMAL LOW (ref 2.5–4.6)

## 2022-07-20 LAB — RETICULOCYTES
Immature Retic Fract: 26.7 % — ABNORMAL HIGH (ref 2.3–15.9)
RBC.: 2.98 MIL/uL — ABNORMAL LOW (ref 3.87–5.11)
Retic Count, Absolute: 49.2 10*3/uL (ref 19.0–186.0)
Retic Ct Pct: 1.7 % (ref 0.4–3.1)

## 2022-07-20 LAB — VITAMIN B12: Vitamin B-12: 337 pg/mL (ref 180–914)

## 2022-07-20 LAB — PROTIME-INR
INR: 1.9 — ABNORMAL HIGH (ref 0.8–1.2)
Prothrombin Time: 21.7 seconds — ABNORMAL HIGH (ref 11.4–15.2)

## 2022-07-20 LAB — FERRITIN: Ferritin: 208 ng/mL (ref 11–307)

## 2022-07-20 LAB — MAGNESIUM: Magnesium: 2.1 mg/dL (ref 1.7–2.4)

## 2022-07-20 MED ORDER — K PHOS MONO-SOD PHOS DI & MONO 155-852-130 MG PO TABS
500.0000 mg | ORAL_TABLET | Freq: Two times a day (BID) | ORAL | Status: AC
  Administered 2022-07-20 (×2): 500 mg via ORAL
  Filled 2022-07-20 (×2): qty 2

## 2022-07-20 MED ORDER — POLYSACCHARIDE IRON COMPLEX 150 MG PO CAPS
150.0000 mg | ORAL_CAPSULE | Freq: Every day | ORAL | Status: DC
  Administered 2022-07-20 – 2022-07-22 (×3): 150 mg via ORAL
  Filled 2022-07-20 (×3): qty 1

## 2022-07-20 NOTE — Progress Notes (Signed)
PROGRESS NOTE    Kristi Meyers  IHK:[id number] DOB: [birth year] DOA: 07/18/2022 PCP: Billie Ruddy, MD   Brief Narrative:  Brief Narrative:  HPI per Dr. 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  Patient was discharged today around 1 PM.  Patient had been hospitalized from 9/25 through 07/18/2022 due to gross hematuria and acute blood loss anemia.   When she got home with her daughter, patient was trying to get into her house.  She fell outside the front door of her house.  Daughter [name]) states that patient got weak in her legs and could not support her weight.  She did not hit her head.  Patient brought back to the ER at 4:20 PM.   On arrival temp 97.6 heart rate 64 blood pressure 130/80 satting 95% on room air.   Labs show sodium 138, potassium 5.2, BUN of 50, creatinine 2.35   Last chemistry that was done in the hospital on 07/11/2022 showed a BUN of 21, creatinine 0.95.   CBC showed a white count of 14.8, hemoglobin 9.1, platelets 223   Left ankle x-ray negative for fracture.   Due to AKI and patient inability to walk, [facility] contacted for admission.    **Interim History  Renal function is improving and hematuria is clearing.  Case was discussed with urology who recommends no acute surgical intervention warranted at this time and recommending a CTA/embolization only if necessary if she starts actively hemorrhaging or becomes profoundly hemodynamically unstable and they are planning for outpatient cystoscopy in the coming weeks.  Assessment and Plan: AKI (acute kidney injury) ([place]) -Admit to med/surg bed. Continue with IVF. Repeat BMP. Likely due to poor po intake. -Patient is BUN/creatinine has gone from 50/2.35 is now 42/1.71 and BUN/creatinine is now 25/1.10 and improving -Avoid further  nephrotoxic medications, contrast dyes, hypotension and dehydration to ensure adequate renal perfusion and will need to renally dose medications -Continue IV fluid hydration with normal saline at 75 MLS per and will stop today -Continue to monitor and trend renal function carefully repeat CMP in the a.m. -We will repeat CTA of the abdomen pelvis if renal function improves further and will need to discuss with interventional radiology   Impaired ambulation -Does not appear that pt walked with PT during her 7 day hospital stay from 9-25 to 10-3. Will need PT consult. TOC consult for SNF placement. Dtr(tiffany) says pt was already using walker(in the house) and cane(outside the house) for assistance. Mostly for balance. -PT OT recommending SNF now I have ordered her a left ankle brace given that the patient has pain left lateral ankle and she stated that she rolled it inward during her fall yesterday   Long term (current) use of anticoagulants - 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 Could not afford Eliquis. -Remains on coumadin. Could not afford Eliquis. Pharmacy consult for inr management and will hold Coumadin given that she still have some hematuria and will continue monitor closely as this is improving   Hyperlipidemia, unspecified -Continue with lipitor 40 mg qhs.   Gross hematuria, improving  -Holding her anticoagulation -Discussed with urology who recommends IR consultation for possible embolectomy versus evaluation for possible repeat CT a scan to localize bleeding; given that she is improving we will hold off and only time we will get a CTA as if  she worsens or is becomes hemodynamically unstable -Per urology no need for Foley catheter at this time and monitor closely -Gross hematuria is improving and urology recommends no acute urological surgical intervention at this time and recommending following up with [facility] in outpatient setting for cystoscopy to complete the gross  hematuria work-up and the patient has significant gross hematuria with concern for acute bleed they may consider CTA to localize the bleed versus an IR consultation for possible embolization   Recent E. coli UTI -Discussed with urology who recommends extending antibiotic course and we have initiated her back on IV ceftriaxone -Repeat urinalysis and urine culture pending and urinalysis could not be tested given blood in the urine -WBC went from 14.8 and is now trending down to 12.2 -Repeat urine cultures pending   Essential hypertension Stable. Pt was not receiving losartan during her hospital stay 9-25 to 10-3.  Likely her AKI due to poor po liquid intake and we will continue to hold naproxen medications -Continue monitor blood pressures per protocol -Last blood pressure reading was 163/88   ABLA (acute blood loss anemia) -S/p EGD on 07-17-2022 that was negative as source of acute blood loss anemia that pt was initially admitted for 07-10-2022. -CT scan done last admission and showed "Redemonstrated high-density nonenhancing filling defect throughout the right renal collecting system and renal pelvis, unchanged to slightly progressed compared to the 07/12/2022 examination and while indeterminate is favored to represent blood clot, the etiology of which is again not depicted on the present examination. Further evaluation with ureteroscopy could be performed as indicated. Mild right-sided pelvicaliectasis without asymmetrically delayed renal enhancement. 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 Abdominal Aortic Aneurysm.  Recommend follow-up aortic ultrasound in 2 years. Suspected hemodynamically significant narrowings involving left common iliac artery as well as the bilateral external iliac and imaged portions of the bilateral superficial femoral arteries. Correlation for symptoms of PAD is advised. Aortic  Atherosclerosis. Previously questioned masslike area involving the greater curvature of the stomach is again not demonstrated on the present examination and thus may be artifactual due to associated diaphragmatic hernia/eventration." -Hemoglobin/hematocrit has been relatively stable last few days and has slightly dropped from 9.1/29.2 in 1-8.0/25.7 yesterday and today is now 8.1/26.3 with hematuria improving   History of pulmonary embolus (PE) - Had saddle PE in 2019 requiring EKOS -On coumadin for VTE prevention. Had saddle PE in 2019 requiring EKOS -INR was 2.0 but will continue to hold Coumadin given recent gross hematuria and bleeding; this is improving   Hypoalbuminemia -Patient's albumin level of 3.1 is now 2.7 yesterday and today is now 2.6 -Continue to monitor trend and repeat CMP in a.m.   Obesity -Complicates overall prognosis and care -Estimated body mass index is 35.27 kg/m as calculated from the following:   Height as of this encounter: '5\' 4"'$  (1.626 m).   Weight as of this encounter: 93.2 kg.  -Weight Loss and Dietary Counseling given  DVT prophylaxis: SCDs Start: 07/18/22 2236    Code Status: Full Code Family Communication: No family currently at bedside  Disposition Plan:  Level of care: Med-Surg Status is: Inpatient Remains inpatient appropriate because: PT OT recommending SNF   Consultants:  Urology  Procedures:  As delineated as above  Antimicrobials:  Anti-infectives (From admission, onward)    Start     Dose/Rate Route Frequency Ordered Stop   07/19/22 2015  cefTRIAXone (ROCEPHIN) 1 g in sodium chloride 0.9 %  100 mL IVPB        1 g 200 mL/hr over 30 Minutes Intravenous Every 24 hours 07/19/22 1921         Subjective: Seen and examined at bedside and she is feeling a little bit better today but still sore in her legs.  No nausea or vomiting.  Thinks that the urine is clearing up and did not notice as much blood.  Denies any other concerns or plaints  this time.  Objective: Vitals:   07/19/22 1931 07/20/22 0458 07/20/22 0708 07/20/22 1347  BP: 125/77  (!) 162/95 (!) 163/88  Pulse: 82  86 88  Resp: '18  18 18  '$ Temp: 99.1 F (37.3 C)  99 F (37.2 C) 99.4 F (37.4 C)  TempSrc: Oral  Oral Oral  SpO2: 99%  98% 97%  Weight:  93.2 kg    Height:        Intake/Output Summary (Last 24 hours) at 07/20/2022 1656 Last data filed at 07/20/2022 1400 Gross per 24 hour  Intake 1534.14 ml  Output 1150 ml  Net 384.14 ml   Filed Weights   07/18/22 1624 07/19/22 0500 07/20/22 0458  Weight: 85.6 kg 88.7 kg 93.2 kg   Examination: Physical Exam:  Constitutional: Well-nourished, well-developed obese African-American female currently in no acute distress Respiratory: Diminished to auscultation bilaterally, no wheezing, rales, rhonchi or crackles. Normal respiratory effort and patient is not tachypenic. No accessory muscle use.  Unlabored breathing Cardiovascular: RRR, no murmurs / rubs / gallops. S1 and S2 auscultated.  Mild lower extremity edema Abdomen: Soft, non-tender, distended secondary body habitus.  Bowel sounds positive.  GU: Deferred. Musculoskeletal: No clubbing / cyanosis of digits/nails. No joint deformity upper and lower extremities. Skin: No rashes, lesions, ulcers. No induration; Warm and dry.  Neurologic: CN 2-12 grossly intact with no focal deficits. Romberg sign and cerebellar reflexes not assessed.  Psychiatric: Normal judgment and insight. Alert and oriented x 3. Normal mood and appropriate affect.   Data Reviewed: I have personally reviewed following labs and imaging studies  CBC: Recent Labs  Lab 07/16/22 0511 07/17/22 0423 07/18/22 1728 07/19/22 0448 07/20/22 0454  WBC 14.2* 13.1* 14.8* 14.1* 12.2*  NEUTROABS 9.7* 8.8* 11.6* 9.9* 7.8*  HGB 8.5* 8.9* 9.1* 8.0* 8.1*  HCT 26.9* 28.7* 29.2* 25.7* 26.3*  MCV 85.7 86.7 86.1 86.5 86.8  PLT 228 233 223 206 474   Basic Metabolic Panel: Recent Labs  Lab 07/18/22 1728  07/19/22 0448 07/20/22 0454  NA 138 141 145  K 5.2* 4.1 3.8  CL 109 113* 115*  CO2 '24 24 23  '$ GLUCOSE 129* 96 92  BUN 50* 42* 25*  CREATININE 2.35* 1.71* 1.10*  CALCIUM 8.7* 8.1* 8.2*  MG  --   --  2.1  PHOS  --   --  2.2*   GFR: Estimated Creatinine Clearance: 54.2 mL/min (A) (by C-G formula based on SCr of 1.1 mg/dL (H)). Liver Function Tests: Recent Labs  Lab 07/18/22 1728 07/19/22 0448 07/20/22 0454  AST 16 14* 14*  ALT '16 14 15  '$ ALKPHOS 61 55 53  BILITOT 0.8 0.8 0.7  PROT 7.0 6.3* 6.2*  ALBUMIN 3.1* 2.7* 2.6*   No results for input(s): "LIPASE", "AMYLASE" in the last 168 hours. No results for input(s): "AMMONIA" in the last 168 hours. Coagulation Profile: Recent Labs  Lab 07/16/22 0511 07/17/22 0423 07/18/22 0403 07/19/22 0448 07/20/22 0454  INR 1.7* 1.4* 1.5* 2.0* 1.9*   Cardiac Enzymes: No results for input(s): "  CKTOTAL", "CKMB", "CKMBINDEX", "TROPONINI" in the last 168 hours. BNP (last 3 results) No results for input(s): "PROBNP" in the last 8760 hours. HbA1C: No results for input(s): "HGBA1C" in the last 72 hours. CBG: No results for input(s): "GLUCAP" in the last 168 hours. Lipid Profile: No results for input(s): "CHOL", "HDL", "LDLCALC", "TRIG", "CHOLHDL", "LDLDIRECT" in the last 72 hours. Thyroid Function Tests: No results for input(s): "TSH", "T4TOTAL", "FREET4", "T3FREE", "THYROIDAB" in the last 72 hours. Anemia Panel: Recent Labs    07/20/22 0454  VITAMINB12 337  FOLATE 6.7  FERRITIN 208  TIBC 197*  IRON 18*  RETICCTPCT 1.7   Sepsis Labs: No results for input(s): "PROCALCITON", "LATICACIDVEN" in the last 168 hours.  Recent Results (from the past 240 hour(s))  Urine Culture     Status: Abnormal   Collection Time: 07/11/22  1:00 AM   Specimen: In/Out Cath Urine  Result Value Ref Range Status   Specimen Description   Final    IN/OUT CATH URINE Performed at [facility] [address] [place], [place] [address]     Special Requests   Final    NONE Performed at [facility], [place] [address], [place], [place] [address]    Culture >=100,000 COLONIES/mL ESCHERICHIA COLI (A)  Final   Report Status 07/13/2022 FINAL  Final   Organism ID, Bacteria ESCHERICHIA COLI (A)  Final      Susceptibility   Escherichia coli - MIC*    AMPICILLIN <=2 SENSITIVE Sensitive     CEFAZOLIN <=4 SENSITIVE Sensitive     CEFEPIME <=0.12 SENSITIVE Sensitive     CEFTRIAXONE <=0.25 SENSITIVE Sensitive     CIPROFLOXACIN <=0.25 SENSITIVE Sensitive     GENTAMICIN <=1 SENSITIVE Sensitive     IMIPENEM <=0.25 SENSITIVE Sensitive     NITROFURANTOIN <=16 SENSITIVE Sensitive     TRIMETH/SULFA <=20 SENSITIVE Sensitive     AMPICILLIN/SULBACTAM <=2 SENSITIVE Sensitive     PIP/TAZO <=4 SENSITIVE Sensitive     * >=100,000 COLONIES/mL ESCHERICHIA COLI  Urine Culture     Status: None (Preliminary result)   Collection Time: 07/18/22  9:31 PM   Specimen: Urine, Clean Catch  Result Value Ref Range Status   Specimen Description   Final    URINE, CLEAN CATCH Performed at [facility], [place] [address], [place], [place] [address]    Special Requests   Final    NONE Performed at [facility], [place] [address] [place], [place] [address]    Culture   Final    CULTURE REINCUBATED FOR BETTER GROWTH Performed at [facility], [place] [address], [place], [place] [address]    Report Status PENDING  Incomplete     Radiology Studies: DG Knee Complete 4 Views Left  Result Date: 07/18/2022 CLINICAL DATA:  Fell, weakness EXAM: LEFT KNEE - COMPLETE 4+ VIEW COMPARISON:  None Available. FINDINGS: Frontal, bilateral oblique, and cross-table lateral views of the left knee are obtained. No acute fracture, subluxation, or dislocation. 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 No joint effusion. Soft tissues are unremarkable. IMPRESSION: 1. Moderate 3 compartmental osteoarthritis.  No acute  fracture. Electronically Signed   By: Randa Ngo M.D.   On: 07/18/2022 17:26   DG Ankle Complete Left  Result Date: 07/18/2022 CLINICAL DATA:  [place], swelling EXAM: LEFT ANKLE COMPLETE - 3+ VIEW COMPARISON:  None Available. FINDINGS: Frontal, oblique, and lateral views of the left ankle are obtained. There are no acute displaced fractures. Alignment  is anatomic. Mild osteoarthritis of the ankle and hindfoot. Mild anterior soft tissue swelling. IMPRESSION: 1. Mild anterior soft tissue swelling.  No acute displaced fracture. Electronically Signed   By: Randa Ngo M.D.   On: 07/18/2022 17:25    Scheduled Meds:  atorvastatin  40 mg Oral QHS   carvedilol  25 mg Oral BID WC   iron polysaccharides  150 mg Oral Daily   pantoprazole  40 mg Oral Daily   phosphorus  500 mg Oral BID   Continuous Infusions:  cefTRIAXone (ROCEPHIN)  IV Stopped (07/19/22 2051)    LOS: 2 days   Raiford Noble, DO [facility] Available via Epic secure chat 7am-7pm After these hours, please refer to coverage provider listed on [email or web address] 07/20/2022, 4:56 PM

## 2022-07-20 NOTE — NC FL2 (Signed)
[place] LEVEL OF CARE SCREENING TOOL     IDENTIFICATION  Patient Name: Kristi Meyers Birthdate: Dec 14, 1955 Sex: female Admission Date (Current Location): 07/18/2022  [facility] and [place] Number:  Herbalist and Address:  [facility],  [place], [place]      Provider Number: [id number]  Attending Physician Name and Address:  Kerney Elbe, DO  Relative Name and Phone Number:  [name] [phone]    Current Level of Care: Hospital Recommended Level of Care: [place] Prior Approval Number:    Date Approved/Denied:   PASRR Number: [id number]  Discharge Plan: SNF    Current Diagnoses: Patient Active Problem List   Diagnosis Date Noted   Impaired ambulation 07/18/2022   ABLA (acute blood loss anemia)    Abnormal CT scan, stomach    Epigastric pain    Long term (current) use of anticoagulants - 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 Could not afford Eliquis. 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) 02/18/2018   AKI (acute kidney injury) ([place]) 12/15/2017    Orientation RESPIRATION BLADDER Height & Weight     Self, Time, Situation, Place  Normal Continent Weight: 205 lb 7.5 oz (93.2 kg) Height:  '5\' 4"'$  (162.6 cm)  BEHAVIORAL SYMPTOMS/MOOD NEUROLOGICAL BOWEL NUTRITION STATUS      Continent Diet (see dc summary)  AMBULATORY STATUS COMMUNICATION OF NEEDS Skin   Extensive Assist Verbally Normal                       Personal Care Assistance Level of Assistance  Bathing, Feeding, Dressing Bathing Assistance:  Maximum assistance Feeding assistance: Independent Dressing Assistance: Maximum assistance     Functional Limitations Info  Sight, Hearing, Speech Sight Info: Impaired (glasses) Hearing Info: Adequate Speech Info: Adequate    SPECIAL CARE FACTORS FREQUENCY  PT (By licensed PT), OT (By licensed OT)     PT Frequency: 5x week OT Frequency: 5x week            Contractures Contractures Info: Not present    Additional Factors Info  Code Status, Allergies Code Status Info: Full Allergies Info: NKA           Current Medications (07/20/2022):  This is the current hospital active medication list Current Facility-Administered Medications  Medication Dose Route Frequency Provider Last Rate Last Admin   acetaminophen (TYLENOL) tablet 650 mg  650 mg Oral Q6H PRN [name], DO   650 mg at 07/19/22 1206   Or   acetaminophen (TYLENOL) suppository 650 mg  650 mg Rectal Q6H PRN [name], DO       atorvastatin (LIPITOR) tablet 40 mg  40 mg Oral QHS [name], DO   40 mg at 07/19/22 2104   carvedilol (COREG) tablet 25 mg  25 mg Oral BID WC [name], DO   25 mg at 07/20/22 0834   cefTRIAXone (ROCEPHIN) 1 g in sodium chloride 0.9 % 100 mL IVPB  1 g Intravenous Q24H Raiford Noble [place], DO   Stopped at 07/19/22 2051   iron polysaccharides (  NIFEREX) capsule 150 mg  150 mg Oral Daily Raiford Noble [place], DO   150 mg at 07/20/22 0839   ondansetron (ZOFRAN) tablet 4 mg  4 mg Oral Q6H PRN [name], DO       Or   ondansetron [facility]) injection 4 mg  4 mg Intravenous Q6H PRN [name], DO       oxyCODONE (Oxy IR/ROXICODONE) immediate release tablet 5 mg  5 mg Oral Q4H PRN [name], DO   5 mg at 07/20/22 0655   pantoprazole (PROTONIX) EC tablet 40 mg  40 mg Oral Daily [name], DO   40 mg at 07/20/22 0834   phosphorus (K PHOS NEUTRAL) tablet 500 mg  500 mg Oral BID Raiford Noble [place], DO   500 mg at 07/20/22 1129     Discharge Medications: Please see discharge summary for a list of  discharge medications.  Relevant Imaging Results:  Relevant Lab Results:   Additional Information SS#: [id number]  Servando Snare, LCSW

## 2022-07-20 NOTE — Progress Notes (Signed)
Patient up and walking in hall with mobility tech. Patient used walker. Patient stated she needed a brace no order for a brace seen on chart. Patient is currently sitting in the chair eating ice cream.

## 2022-07-20 NOTE — Plan of Care (Signed)
  Problem: Education: Goal: Knowledge of General Education information will improve Description: Including pain rating scale, medication(s)/side effects and non-pharmacologic comfort measures Outcome: Not Progressing   

## 2022-07-20 NOTE — TOC Initial Note (Addendum)
Transition of Care [facility]) - Initial/Assessment Note    Patient Details  Name: Kristi Meyers MRN: [id number] Date of Birth: 1956/05/07  [facility]) CM/SW Contact:    Servando Snare, LCSW Phone Number: 07/20/2022, 11:36 AM  Clinical Narrative:     Patient is from home alone. Patient reports using a cane and walker at baseline. Patient reports she still drives and can bathe and dress in dependently She reports she cooks and clean when she is able to. She reports to be supported by her daughters. Patient is agreeable to SNF for rehab and has been to rehab before. TOC to work patient up for SNF.          TOC spoke with patient daughter Kristi Meyers regarding SNF. Family is agreeable and stated preferred facilities.    Expected Discharge Plan: Skilled Nursing Facility Barriers to Discharge: Continued Medical Work up   Patient Goals and CMS Choice Patient states their goals for this hospitalization and ongoing recovery are:: rehab if thats going to help my legs CMS Medicare.gov Compare Post Acute Care list provided to:: Patient Choice offered to / list presented to : Patient  Expected Discharge Plan and Services Expected Discharge Plan: [place] In-house Referral: NA Discharge Planning Services: NA Post Acute Care Choice: [place] Living arrangements for the past 2 months: Apartment                 DME Arranged: N/A DME Agency: NA       HH Arranged: NA [place] Agency: NA        Prior Living Arrangements/Services Living arrangements for the past 2 months: Apartment Lives with:: Self Patient language and need for interpreter reviewed:: Yes Do you feel safe going back to the place where you live?: Yes      Need for Family Participation in Patient Care: Yes (Comment) Care giver support system in place?: Yes (comment) Current home services: DME Criminal Activity/Legal Involvement Pertinent to Current Situation/Hospitalization: No - Comment as  needed  Activities of Daily Living Home Assistive Devices/Equipment: Eyeglasses, Environmental consultant (specify type) ADL Screening (condition at time of admission) Patient's cognitive ability adequate to safely complete daily activities?: Yes Is the patient deaf or have difficulty hearing?: No Does the patient have difficulty seeing, even when wearing glasses/contacts?: No Does the patient have difficulty concentrating, remembering, or making decisions?: No Patient able to express need for assistance with ADLs?: Yes Does the patient have difficulty dressing or bathing?: Yes Independently performs ADLs?: No Communication: Independent Dressing (OT): Needs assistance Is this a change from baseline?: Pre-admission baseline Grooming: Independent Feeding: Independent Bathing: Needs assistance Is this a change from baseline?: Pre-admission baseline Toileting: Needs assistance Is this a change from baseline?: Pre-admission baseline In/Out Bed: Needs assistance Is this a change from baseline?: Pre-admission baseline Walks in Home: Needs assistance Is this a change from baseline?: Pre-admission baseline Does the patient have difficulty walking or climbing stairs?: Yes Weakness of Legs: Both Weakness of Arms/Hands: None  Permission Sought/Granted Permission sought to share information with : Family Supports Permission granted to share information with : Yes, Verbal Permission Granted  Share Information with NAME: Kristi Meyers [phone] or Kristi Meyers [phone]     Permission granted to share info w Relationship: daughters     Emotional Assessment Appearance:: Appears younger than stated age Attitude/Demeanor/Rapport: Engaged Affect (typically observed): Accepting Orientation: : Oriented to Self, Oriented to Place, Oriented to  Time, Oriented to Situation Alcohol / Substance Use: Not Applicable Psych Involvement: No (  comment)  Admission diagnosis:  AKI (acute kidney injury) ([place]) [N17.9] Fall, initial  encounter [W19.XXXA] Patient Active Problem List   Diagnosis Date Noted   Impaired ambulation 07/18/2022   ABLA (acute blood loss anemia)    Abnormal CT scan, stomach    Epigastric pain    Long term (current) use of anticoagulants - 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 Could not afford Eliquis. 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 [place], [address] [place] [address] Phone: [phone] Fax: [phone]     Social Determinants of Health (SDOH) Interventions    Readmission Risk Interventions     No data to display

## 2022-07-20 NOTE — Plan of Care (Signed)

## 2022-07-20 NOTE — Consult Note (Addendum)
Urology Consult   Physician requesting consult: Dr. Raiford Noble  Reason for consult: Gross hematuria  History of Present Illness: Kristi Meyers is a 66 y.o. female with history of uterine polyp, PE on warfarin, hypertension who was recently admitted last week for gross hematuria in the setting of supratherapeutic INR to 5.1. Imaging during that admission showed small hematoma in right collecting system with punctate nonobstructing stone in the left kidney, and left renal cyst without any concerning lesions or obstructive uropathy. She was found to have an E. Coli UTI and was given appropriate antibiotics. She was discharged on 07/18/22. Later that day, she sustained a fall while attempting to enter her home and injured her ankle. She presented to the ED for evaluation. She was afebrile and hemodynamically stable. Initial labs in the ED with Leukocytosis to 14.8, Hgb 9.1, AKI with Cr 2.3 (baseline 0.9). She had significant left ankle pain. She was admitted for further evaluation. Urology is consulted for gross hematuria.  Today, she reports that she continues to have significant left ankle pain and is unable to ambulate. She denies any issues with voiding, denies any dysuria or suprapubic discomfort.  Labs this morning with stable Hgb at 8.0 (8.1 yesterday). Cr improved 1.10 with fluid resuscitation. No repeat imaging has been obtained this admission.  Past Medical History:  Diagnosis Date   Abnormal EKG    hx of left bundle branch block    Acute saddle pulmonary embolus (HCC) 12/10/2017   Anginal pain (HCC)    stable   GERD (gastroesophageal reflux disease)    Heart murmur    soft systolic per dr Terrence Dupont [id number] lov   High cholesterol    Hypertension    Left leg DVT ([place]) 11/2017   pulmonary embolus   Migraine    PMB (postmenopausal bleeding)    Vertigo     Past Surgical History:  Procedure Laterality Date   BIOPSY  07/17/2022   Procedure: BIOPSY;  Surgeon: Irving Copas., MD;  Location: [facility];  Service: Gastroenterology;;   [place]  2011   [place] N/A 09/07/2020   Procedure: DILATATION & CURETTAGE/HYSTEROSCOPY WITH MYOSURE;  Surgeon: Salvadore Dom, MD;  Location: [facility];  Service: Gynecology;  Laterality: N/A;   ESOPHAGOGASTRODUODENOSCOPY Left 07/17/2022   Procedure: ESOPHAGOGASTRODUODENOSCOPY (EGD);  Surgeon: Irving Copas., MD;  Location: [facility];  Service: Gastroenterology;  Laterality: Left;   IR ANGIOGRAM PULMONARY BILATERAL SELECTIVE  12/10/2017   IR ANGIOGRAM SELECTIVE EACH ADDITIONAL VESSEL  12/10/2017   IR ANGIOGRAM SELECTIVE EACH ADDITIONAL VESSEL  12/10/2017   IR INFUSION THROMBOL ARTERIAL INITIAL (MS)  12/10/2017   IR INFUSION THROMBOL ARTERIAL INITIAL (MS)  12/10/2017   IR THROMB F/U EVAL ART/VEN FINAL DAY (MS)  12/11/2017   IR US GUIDE VASC ACCESS RIGHT  12/10/2017   UPPER ESOPHAGEAL ENDOSCOPIC ULTRASOUND (EUS) N/A 07/17/2022   Procedure: UPPER ESOPHAGEAL ENDOSCOPIC ULTRASOUND (EUS);  Surgeon: Irving Copas., MD;  Location: [facility];  Service: Gastroenterology;  Laterality: N/A;    Current Hospital Medications:  Home Meds:  No outpatient medications have been marked as taking for the 07/18/22 encounter [facility] Encounter).    Scheduled Meds:  atorvastatin  40 mg Oral QHS   carvedilol  25 mg Oral BID WC   pantoprazole  40 mg Oral Daily   Continuous Infusions:  sodium chloride 75 mL/hr at 07/20/22 0438   cefTRIAXone (ROCEPHIN)  IV Stopped (07/19/22 2051)   PRN Meds:.acetaminophen **OR** acetaminophen,  ondansetron **OR** ondansetron (ZOFRAN) IV, oxyCODONE  Allergies: No Known Allergies  History reviewed. No pertinent family history.  Social History:  reports that she quit smoking about 4 years ago. Her smoking use included cigarettes. She has a 5.00 pack-year smoking history. She has never used smokeless tobacco. She reports  current drug use. Drug: Marijuana. She reports that she does not drink alcohol.  ROS: A complete review of systems was performed.  All systems are negative except for pertinent findings as noted.  Physical Exam:  Vital signs in last 24 hours: Temp:  [98.5 F (36.9 C)-99.1 F (37.3 C)] 99 F (37.2 C) (10/05 0708) Pulse Rate:  [66-86] 86 (10/05 0708) Resp:  [16-18] 18 (10/05 0708) BP: (111-162)/(77-95) 162/95 (10/05 0708) SpO2:  [96 %-99 %] 98 % (10/05 0708) Weight:  [93.2 kg] 93.2 kg (10/05 0458) Constitutional:  Alert and oriented, No acute distress Cardiovascular: Regular rate  Respiratory: Normal respiratory effort GI: Abdomen is soft, nontender, nondistended, no abdominal masses GU: No CVA tenderness, external urinary device in place, urine in cannister is clear light tea colored     Laboratory Data:  Recent Labs    07/18/22 1728 07/19/22 0448 07/20/22 0454  WBC 14.8* 14.1* 12.2*  HGB 9.1* 8.0* 8.1*  HCT 29.2* 25.7* 26.3*  PLT 223 206 228    Recent Labs    07/18/22 1728 07/19/22 0448 07/20/22 0454  NA 138 141 145  K 5.2* 4.1 3.8  CL 109 113* 115*  GLUCOSE 129* 96 92  BUN 50* 42* 25*  CALCIUM 8.7* 8.1* 8.2*  CREATININE 2.35* 1.71* 1.10*     Results for orders placed or performed during the hospital encounter of 07/18/22 (from the past 24 hour(s))  Protime-INR     Status: Abnormal   Collection Time: 07/20/22  4:54 AM  Result Value Ref Range   Prothrombin Time 21.7 (H) 11.4 - 15.2 seconds   INR 1.9 (H) 0.8 - 1.2  CBC with Differential/Platelet     Status: Abnormal   Collection Time: 07/20/22  4:54 AM  Result Value Ref Range   WBC 12.2 (H) 4.0 - 10.5 K/uL   RBC 3.03 (L) 3.87 - 5.11 MIL/uL   Hemoglobin 8.1 (L) 12.0 - 15.0 g/dL   HCT 26.3 (L) 36.0 - 46.0 %   MCV 86.8 80.0 - 100.0 fL   MCH 26.7 26.0 - 34.0 pg   MCHC 30.8 30.0 - 36.0 g/dL   RDW 16.0 (H) 11.5 - 15.5 %   Platelets 228 150 - 400 K/uL   nRBC 0.0 0.0 - 0.2 %   Neutrophils Relative % 63 %    Neutro Abs 7.8 (H) 1.7 - 7.7 K/uL   Lymphocytes Relative 21 %   Lymphs Abs 2.6 0.7 - 4.0 K/uL   Monocytes Relative 11 %   Monocytes Absolute 1.3 (H) 0.1 - 1.0 K/uL   Eosinophils Relative 2 %   Eosinophils Absolute 0.2 0.0 - 0.5 K/uL   Basophils Relative 1 %   Basophils Absolute 0.1 0.0 - 0.1 K/uL   Immature Granulocytes 2 %   Abs Immature Granulocytes 0.25 (H) 0.00 - 0.07 K/uL  Comprehensive metabolic panel     Status: Abnormal   Collection Time: 07/20/22  4:54 AM  Result Value Ref Range   Sodium 145 135 - 145 mmol/L   Potassium 3.8 3.5 - 5.1 mmol/L   Chloride 115 (H) 98 - 111 mmol/L   CO2 23 22 - 32 mmol/L   Glucose, Bld 92 70 -  99 mg/dL   BUN 25 (H) 8 - 23 mg/dL   Creatinine, Ser 1.10 (H) 0.44 - 1.00 mg/dL   Calcium 8.2 (L) 8.9 - 10.3 mg/dL   Total Protein 6.2 (L) 6.5 - 8.1 g/dL   Albumin 2.6 (L) 3.5 - 5.0 g/dL   AST 14 (L) 15 - 41 U/L   ALT 15 0 - 44 U/L   Alkaline Phosphatase 53 38 - 126 U/L   Total Bilirubin 0.7 0.3 - 1.2 mg/dL   GFR, Estimated 55 (L) >60 mL/min   Anion gap 7 5 - 15  Phosphorus     Status: Abnormal   Collection Time: 07/20/22  4:54 AM  Result Value Ref Range   Phosphorus 2.2 (L) 2.5 - 4.6 mg/dL  Magnesium     Status: None   Collection Time: 07/20/22  4:54 AM  Result Value Ref Range   Magnesium 2.1 1.7 - 2.4 mg/dL  Vitamin B12     Status: None   Collection Time: 07/20/22  4:54 AM  Result Value Ref Range   Vitamin B-12 337 180 - 914 pg/mL  Folate     Status: None   Collection Time: 07/20/22  4:54 AM  Result Value Ref Range   Folate 6.7 >5.9 ng/mL  Iron and TIBC     Status: Abnormal   Collection Time: 07/20/22  4:54 AM  Result Value Ref Range   Iron 18 (L) 28 - 170 ug/dL   TIBC 197 (L) 250 - 450 ug/dL   Saturation Ratios 9 (L) 10.4 - 31.8 %   UIBC 179 ug/dL  Ferritin     Status: None   Collection Time: 07/20/22  4:54 AM  Result Value Ref Range   Ferritin 208 11 - 307 ng/mL  Reticulocytes     Status: Abnormal   Collection Time: 07/20/22   4:54 AM  Result Value Ref Range   Retic Ct Pct 1.7 0.4 - 3.1 %   RBC. 2.98 (L) 3.87 - 5.11 MIL/uL   Retic Count, Absolute 49.2 19.0 - 186.0 K/uL   Immature Retic Fract 26.7 (H) 2.3 - 15.9 %   Recent Results (from the past 240 hour(s))  Urine Culture     Status: Abnormal   Collection Time: 07/11/22  1:00 AM   Specimen: In/Out Cath Urine  Result Value Ref Range Status   Specimen Description   Final    IN/OUT CATH URINE Performed at [facility], [place] [address], [place], [place] [address]    Special Requests   Final    NONE Performed at [facility], [place] [address], [place], [place] [address]    Culture >=100,000 COLONIES/mL ESCHERICHIA COLI (A)  Final   Report Status 07/13/2022 FINAL  Final   Organism ID, Bacteria ESCHERICHIA COLI (A)  Final      Susceptibility   Escherichia coli - MIC*    AMPICILLIN <=2 SENSITIVE Sensitive     CEFAZOLIN <=4 SENSITIVE Sensitive     CEFEPIME <=0.12 SENSITIVE Sensitive     CEFTRIAXONE <=0.25 SENSITIVE Sensitive     CIPROFLOXACIN <=0.25 SENSITIVE Sensitive     GENTAMICIN <=1 SENSITIVE Sensitive     IMIPENEM <=0.25 SENSITIVE Sensitive     NITROFURANTOIN <=16 SENSITIVE Sensitive     TRIMETH/SULFA <=20 SENSITIVE Sensitive     AMPICILLIN/SULBACTAM <=2 SENSITIVE Sensitive     PIP/TAZO <=4 SENSITIVE Sensitive     * >=100,000 COLONIES/mL ESCHERICHIA COLI    Renal Function: Recent Labs  07/18/22 1728 07/19/22 0448 07/20/22 0454  CREATININE 2.35* 1.71* 1.10*   Estimated Creatinine Clearance: 54.2 mL/min (A) (by C-G formula based on SCr of 1.1 mg/dL (H)).  Radiologic Imaging: DG Knee Complete 4 Views Left  Result Date: 07/18/2022 CLINICAL DATA:  Fell, weakness EXAM: LEFT KNEE - COMPLETE 4+ VIEW COMPARISON:  None Available. FINDINGS: Frontal, bilateral oblique, and cross-table lateral views of the left knee are obtained. No acute fracture, subluxation, or dislocation. There is moderate 3 compartmental  osteoarthritis. No joint effusion. Soft tissues are unremarkable. IMPRESSION: 1. Moderate 3 compartmental osteoarthritis.  No acute fracture. Electronically Signed   By: Randa Ngo M.D.   On: 07/18/2022 17:26   DG Ankle Complete Left  Result Date: 07/18/2022 CLINICAL DATA:  [place], swelling EXAM: LEFT ANKLE COMPLETE - 3+ VIEW COMPARISON:  None Available. FINDINGS: Frontal, oblique, and lateral views of the left ankle are obtained. There are no acute displaced fractures. Alignment is anatomic. Mild osteoarthritis of the ankle and hindfoot. Mild anterior soft tissue swelling. IMPRESSION: 1. Mild anterior soft tissue swelling.  No acute displaced fracture. Electronically Signed   By: Randa Ngo M.D.   On: 07/18/2022 17:25    I independently reviewed the above imaging studies.  Impression: 66 year old female with history of uterine polyp, PE on warfarin recently admitted for gross hematuria in setting of supratherapeutic INR and UTI. Readmitted after sustaining a fall on day of discharge. Urology consulted for gross hematuria.  Pt afebrile and stable. Hgb stable over the last 24 hrs at 8.0. Cr improved greatly with fluid resuscitation. UA with color interference due to hematuria. Urine culture in process. Currently receiving IV Rocephin. No repeat imaging obtained this admission but imaging from last admission notable for small clot in right renal pelvis with non obstructing left punctate renal stone and left renal cyst without any further concerning lesions or obstructive uropathy. Urine in cannister this morning is light tea colored. See picture above. Gross hematuria has resolved. Microscopic hematuria may persist until the clot in renal pelvis completely breaks down is resorbed.   Recommendation: - No indication for acute urologic surgical intervention at this time. - Continue IV Ceftriaxone and follow up urine culture to tailor antibiotics appropriately.  - Patient will need to follow up  with [facility] in outpatient setting for cystoscopy to complete the gross hematuria workup. - If patient has recurrence of significant gross hematuria with concern for acute bleed, may consider CTA to localize bleed vs IR consult for possible embolization.  Coralyn Pear, MD Resident Physician [facility] 07/20/2022, 7:32 AM

## 2022-07-20 NOTE — Progress Notes (Signed)
Patient requesting to go back to bed. Rn and CNA at bedside with the Steady. Patient tolerated well and transferred back to bed.

## 2022-07-20 NOTE — Progress Notes (Signed)
Mobility Specialist - Progress Note   07/20/22 1557  Mobility  Activity Ambulated with assistance in hallway  Activity Response Tolerated well  Distance Ambulated (ft) 40 ft  $Mobility charge 1 Mobility  Level of Assistance Moderate assist, patient does 50-74%  Assistive Device Front wheel walker  HOB Elevated/Bed Position Self regulated  Range of Motion/Exercises Active  Transport method Ambulatory  Mobility Referral No   Pt was seen by MS due to nurse requesting assistance in ambulating. Pt received in bed & agreeable to mobility. Pt MaxA from sit-to-stand. Pt ModA during ambulation. Pt body very stiff during ambulation. Pt left in recliner with all needs met & call bell in reach. Will continue to let PT see this pt.  Kristi Meyers

## 2022-07-21 ENCOUNTER — Encounter: Payer: Self-pay | Admitting: Gastroenterology

## 2022-07-21 DIAGNOSIS — Z86711 Personal history of pulmonary embolism: Secondary | ICD-10-CM | POA: Diagnosis not present

## 2022-07-21 DIAGNOSIS — I10 Essential (primary) hypertension: Secondary | ICD-10-CM | POA: Diagnosis not present

## 2022-07-21 DIAGNOSIS — N179 Acute kidney failure, unspecified: Secondary | ICD-10-CM | POA: Diagnosis not present

## 2022-07-21 DIAGNOSIS — D62 Acute posthemorrhagic anemia: Secondary | ICD-10-CM | POA: Diagnosis not present

## 2022-07-21 LAB — CBC WITH DIFFERENTIAL/PLATELET
Abs Immature Granulocytes: 0.06 10*3/uL (ref 0.00–0.07)
Basophils Absolute: 0.1 10*3/uL (ref 0.0–0.1)
Basophils Relative: 1 %
Eosinophils Absolute: 0.1 10*3/uL (ref 0.0–0.5)
Eosinophils Relative: 1 %
HCT: 25.7 % — ABNORMAL LOW (ref 36.0–46.0)
Hemoglobin: 8 g/dL — ABNORMAL LOW (ref 12.0–15.0)
Immature Granulocytes: 1 %
Lymphocytes Relative: 23 %
Lymphs Abs: 2.7 10*3/uL (ref 0.7–4.0)
MCH: 27 pg (ref 26.0–34.0)
MCHC: 31.1 g/dL (ref 30.0–36.0)
MCV: 86.8 fL (ref 80.0–100.0)
Monocytes Absolute: 1.2 10*3/uL — ABNORMAL HIGH (ref 0.1–1.0)
Monocytes Relative: 10 %
Neutro Abs: 7.7 10*3/uL (ref 1.7–7.7)
Neutrophils Relative %: 64 %
Platelets: 276 10*3/uL (ref 150–400)
RBC: 2.96 MIL/uL — ABNORMAL LOW (ref 3.87–5.11)
RDW: 16.2 % — ABNORMAL HIGH (ref 11.5–15.5)
WBC: 11.8 10*3/uL — ABNORMAL HIGH (ref 4.0–10.5)
nRBC: 0 % (ref 0.0–0.2)

## 2022-07-21 LAB — COMPREHENSIVE METABOLIC PANEL
ALT: 16 U/L (ref 0–44)
AST: 14 U/L — ABNORMAL LOW (ref 15–41)
Albumin: 2.7 g/dL — ABNORMAL LOW (ref 3.5–5.0)
Alkaline Phosphatase: 50 U/L (ref 38–126)
Anion gap: 9 (ref 5–15)
BUN: 15 mg/dL (ref 8–23)
CO2: 23 mmol/L (ref 22–32)
Calcium: 7.8 mg/dL — ABNORMAL LOW (ref 8.9–10.3)
Chloride: 111 mmol/L (ref 98–111)
Creatinine, Ser: 0.92 mg/dL (ref 0.44–1.00)
GFR, Estimated: >60 mL/min (ref >60)
Glucose, Bld: 102 mg/dL — ABNORMAL HIGH (ref 70–99)
Potassium: 3.3 mmol/L — ABNORMAL LOW (ref 3.5–5.1)
Sodium: 143 mmol/L (ref 135–145)
Total Bilirubin: 0.7 mg/dL (ref 0.3–1.2)
Total Protein: 6 g/dL — ABNORMAL LOW (ref 6.5–8.1)

## 2022-07-21 LAB — PROTIME-INR
INR: 1.8 — ABNORMAL HIGH (ref 0.8–1.2)
Prothrombin Time: 20.9 seconds — ABNORMAL HIGH (ref 11.4–15.2)

## 2022-07-21 LAB — MAGNESIUM: Magnesium: 1.6 mg/dL — ABNORMAL LOW (ref 1.7–2.4)

## 2022-07-21 LAB — PHOSPHORUS: Phosphorus: 2.8 mg/dL (ref 2.5–4.6)

## 2022-07-21 MED ORDER — POTASSIUM CHLORIDE CRYS ER 20 MEQ PO TBCR
40.0000 meq | EXTENDED_RELEASE_TABLET | Freq: Two times a day (BID) | ORAL | Status: AC
  Administered 2022-07-21 (×2): 40 meq via ORAL
  Filled 2022-07-21 (×2): qty 2

## 2022-07-21 MED ORDER — ONDANSETRON HCL 4 MG PO TABS: 4.0000 mg | ORAL_TABLET | Freq: Four times a day (QID) | ORAL | 0 refills | Status: AC | PRN

## 2022-07-21 MED ORDER — WARFARIN SODIUM 4 MG PO TABS: 4.0000 mg | ORAL_TABLET | Freq: Every day | ORAL | 0 refills | Status: AC

## 2022-07-21 MED ORDER — MAGNESIUM SULFATE 2 GM/50ML IV SOLN
2.0000 g | Freq: Once | INTRAVENOUS | Status: AC
  Administered 2022-07-21: 2 g via INTRAVENOUS
  Filled 2022-07-21: qty 50

## 2022-07-21 MED ORDER — CEFADROXIL 500 MG PO CAPS: 500.0000 mg | ORAL_CAPSULE | Freq: Two times a day (BID) | ORAL | Status: DC

## 2022-07-21 MED ORDER — OXYCODONE HCL 5 MG PO TABS: 5.0000 mg | ORAL_TABLET | Freq: Four times a day (QID) | ORAL | 0 refills | Status: AC | PRN

## 2022-07-21 MED ORDER — POTASSIUM CHLORIDE 10 MEQ/100ML IV SOLN: 10.0000 meq | INTRAVENOUS | Status: DC

## 2022-07-21 MED ORDER — POLYSACCHARIDE IRON COMPLEX 150 MG PO CAPS: 150.0000 mg | ORAL_CAPSULE | Freq: Every day | ORAL | 0 refills | Status: AC

## 2022-07-21 MED ORDER — CEFADROXIL 500 MG PO CAPS
500.0000 mg | ORAL_CAPSULE | Freq: Two times a day (BID) | ORAL | Status: DC
  Administered 2022-07-21 – 2022-07-22 (×2): 500 mg via ORAL
  Filled 2022-07-21 (×4): qty 1

## 2022-07-21 NOTE — Plan of Care (Signed)
?  Problem: Clinical Measurements: ?Goal: Will remain free from infection ?Outcome: Progressing ?  ?

## 2022-07-21 NOTE — Progress Notes (Signed)
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  Patient was discharged today around 1 PM.  Patient had been hospitalized from 9/25 through 07/18/2022 due to gross hematuria and acute blood loss anemia.     When she got home with her daughter, patient was trying to get into her house.  She fell outside the front door of her house.  Patient brought back to the ER    PT Comments    Pt received supine in bed somewhat reluctant to mobilize but with encouragement agreeable to be seen, reporting 4/10 pain in L ankle and some fear regarding buckling of her R knee. Pt required max assist +2 for bed mobility but suspect pt able to complete more of this particular mobility task herself. Required min guard +2 for sit to stand transfer from elevated surface and max encouragement to ambulate in room with RW, min guard only, distance limited by pain and fatigue. Pt in recliner and repositioned for comfort with breakfast in front of her, encouraged to her to and mobilize with nursing outside of therapy session. Discharge destination remains appropriate we will continue to follow acutely.   Recommendations for follow up therapy are one component of a multi-disciplinary discharge planning process, led by the attending physician.  Recommendations may be updated based on patient status, additional functional criteria and insurance authorization.  Follow Up Recommendations  Skilled nursing-short term rehab (<3 hours/day) Can patient physically be transported by private vehicle: No   Assistance Recommended at Discharge Frequent or constant Supervision/Assistance  Patient can return home with the following Two people to help  with walking and/or transfers;A lot of help with bathing/dressing/bathroom;Assistance with cooking/housework;Assist for transportation;Help with stairs or ramp for entrance   Equipment Recommendations  None recommended by PT    Recommendations for Other Services       Precautions / Restrictions Precautions Precautions: Fall Precaution Comments: fell on day of re-admission; R knee buckles, L ankle painful 2* sprain Restrictions Weight Bearing Restrictions: No     Mobility  Bed Mobility Overal bed mobility: Needs Assistance Bed Mobility: Supine to Sit     Supine to sit: Max assist, +2 for physical assistance     General bed mobility comments: assist to raise trunk, advance BLE and pivot hips with use of chuck pad    Transfers Overall transfer level: Needs assistance Equipment used: Rolling walker (2 wheels) Transfers: Sit to/from Stand Sit to Stand: +2 safety/equipment, From elevated surface, Min guard           General transfer comment: Pt able to stand with min guard +2 for safety from elevated surface, no physical assist required or overt LOB noted.    Ambulation/Gait Ambulation/Gait assistance: Min guard Gait Distance (Feet): 10 Feet Assistive device: Rolling walker (2 wheels) Gait Pattern/deviations: Step-to pattern, Decreased step length - right, Decreased step length - left, Decreased weight shift to left Gait velocity: decreased     General Gait Details: Pt mentioned she felt "fearful of taking steps" but with encouragement walked 83f in room with heavy BUE support on RW, min guard, no physical assist required or overt LOB noted.   Stairs             Wheelchair Mobility    Modified  Rankin (Stroke Patients Only)       Balance Overall balance assessment: Needs assistance Sitting-balance support: No upper extremity supported, Feet supported Sitting balance-Leahy Scale: Fair     Standing balance support: Bilateral upper extremity supported,  During functional activity Standing balance-Leahy Scale: Poor                              Cognition Arousal/Alertness: Awake/alert Behavior During Therapy: WFL for tasks assessed/performed Overall Cognitive Status: Within Functional Limits for tasks assessed                                          Exercises General Exercises - Lower Extremity Ankle Circles/Pumps: AROM, Left, 10 reps, Supine    General Comments        Pertinent Vitals/Pain Pain Assessment Pain Assessment: Faces Pain Score: 4  Faces Pain Scale: Hurts little more Pain Location: L ankle with movement Pain Descriptors / Indicators: Grimacing, Guarding, Sore Pain Intervention(s): Limited activity within patient's tolerance, Monitored during session, Repositioned    Home Living                          Prior Function            PT Goals (current goals can now be found in the care plan section) Acute Rehab PT Goals Patient Stated Goal: to get stronger PT Goal Formulation: With patient Time For Goal Achievement: 08/02/22 Potential to Achieve Goals: Fair Progress towards PT goals: Progressing toward goals    Frequency    Min 2X/week      PT Plan Current plan remains appropriate    Co-evaluation              AM-PAC PT "6 Clicks" Mobility   Outcome Measure  Help needed turning from your back to your side while in a flat bed without using bedrails?: A Lot Help needed moving from lying on your back to sitting on the side of a flat bed without using bedrails?: A Lot Help needed moving to and from a bed to a chair (including a wheelchair)?: A Lot Help needed standing up from a chair using your arms (e.g., wheelchair or bedside chair)?: A Lot Help needed to walk in hospital room?: A Lot Help needed climbing 3-5 steps with a railing? : Total 6 Click Score: 11    End of Session Equipment Utilized During Treatment: Gait belt Activity Tolerance: Patient  limited by pain;Patient limited by fatigue Patient left: in chair;with call bell/phone within reach;with chair alarm set Nurse Communication: Mobility status PT Visit Diagnosis: Difficulty in walking, not elsewhere classified (R26.2);Pain;History of falling (Z91.81) Pain - Right/Left: Left Pain - part of body: Ankle and joints of foot     Time: 1000-1011 PT Time Calculation (min) (ACUTE ONLY): 11 min  Charges:  $Gait Training: 8-22 mins                     Coolidge Breeze, PT, DPT [facility] Department Office: [phone] Weekend pager: [phone]   Coolidge Breeze 07/21/2022, 10:47 AM

## 2022-07-21 NOTE — Progress Notes (Signed)
Pt called out asking to go to the bathroom. This nurse and another nurse, Loma Sousa, RN assisted the patient to the bedside commode. The nurses gave the patient a new gown and changed all the bed linen per patient request. Pt also requested that the purewick be replaced. The purewick was replaced. Pt c/o nausea and this nurse administered IV zofran. Pt was transferred back to bed. This nurse will continue to monitor patient.

## 2022-07-21 NOTE — Discharge Summary (Signed)
Physician Discharge Summary   Patient: Kristi Meyers MRN: [id number] DOB: [birth year]  Admit date:     07/18/2022  Discharge date: 07/21/22  Discharge Physician: Raiford Noble, DO    PCP: Billie Ruddy, MD   Recommendations at discharge:   Follow-up with PCP within 1 to 2 weeks and repeat CBC, CMP, mag, Phos within 1 week Follow-up with cardiology Dr. 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 Could not afford Eliquis.  Resolved Problems:   * No resolved hospital problems. [facility] Course: HPI per Dr. 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  Patient was discharged today around 1 PM.  Patient had been hospitalized from 9/25 through 07/18/2022 due to gross hematuria and acute blood loss anemia.   When she got home with her daughter, patient was trying to get into her house.  She fell outside the front door of her house.  Daughter [name]) states that patient got weak in her legs and could not support her weight.  She did not hit her head.  Patient brought back to the ER at 4:20 PM.   On arrival temp 97.6 heart rate 64 blood pressure 130/80 satting 95% on room air.   Labs show sodium 138, potassium 5.2, BUN of 50, creatinine 2.35   Last chemistry that was done in the hospital on 07/11/2022 showed a BUN of 21, creatinine 0.95.   CBC showed a white count of 14.8, hemoglobin 9.1,  platelets 223   Left ankle x-ray negative for fracture.   Due to AKI and patient inability to walk, [facility] hospitalist contacted for admission.    **Interim History  Renal function is improving and hematuria is clearing.  Case was discussed with urology who recommends no acute surgical intervention warranted at this time and recommending a CTA/embolization only if necessary if she starts actively hemorrhaging or becomes profoundly hemodynamically unstable and they are planning for outpatient cystoscopy in the coming weeks.  She is improved and her hematuria is improving and after discussion with urology it is okay to resume her anticoagulation with Coumadin we will place her back on 4 mg per cardiology direction.  She will follow-up with Dr. Terrence Dupont in the outpatient setting and have a repeat INR check on Monday or Tuesday of the coming week.  She is medically stable for discharge  Assessment and Plan: AKI (acute kidney injury) ([place]) -Admit to med/surg bed. Continue with IVF. Repeat BMP. Likely due to poor po intake. -Patient is BUN/creatinine has gone from 50/2.35 is now 42/1.71 and BUN/creatinine is now 25/1.10 yesterday and today is now 15/0.90 and improving -Avoid further nephrotoxic medications, contrast dyes, hypotension and dehydration to ensure adequate renal perfusion and will need to renally dose medications -Continue IV fluid hydration with normal saline at 75 MLS per and will stop today -Continue to monitor and trend renal function carefully repeat CMP in the a.m. -We will  repeat CTA of the abdomen pelvis if renal function improves further and will need to discuss with interventional radiology   Impaired ambulation -Does not appear that pt walked with PT during her 7 day hospital stay from 9-25 to 10-3. Will need PT consult. TOC consult for SNF placement. Dtr(tiffany) says pt was already using walker(in the house) and cane(outside the house) for assistance. Mostly for balance. -PT OT  recommending SNF now I have ordered her a left ankle brace given that the patient has pain left lateral ankle and she stated that she rolled it inward during her fall prior to admission   Long term (current) use of anticoagulants - 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 Could not afford Eliquis. -Remains on coumadin. Could not afford Eliquis. Pharmacy consult for inr management and will hold Coumadin given that she still have some hematuria and will continue monitor closely as this is improving   Hyperlipidemia, unspecified -Continue with lipitor 40 mg qhs.   Gross hematuria, improving  -Holding her anticoagulation -Discussed with urology who recommends IR consultation for possible embolectomy versus evaluation for possible repeat CT a scan to localize bleeding; given that she is improving we will hold off and only time we will get a CTA as if she worsens or is becomes hemodynamically unstable -Per urology no need for Foley catheter at this time and monitor closely -Gross hematuria is improving and urology recommends no acute urological surgical intervention at this time and recommending following up with [facility] in outpatient setting for cystoscopy to complete the gross hematuria work-up and the patient has significant gross hematuria with concern for acute bleed they may consider CTA to localize the bleed versus an IR consultation for possible embolization -Okay to resume anticoagulation per urology and follow-up in outpatient setting for cystoscopy   Recent E. coli UTI -Discussed with urology who recommends extending antibiotic course and we have initiated her back on IV ceftriaxone -Repeat urinalysis and urine culture pending and urinalysis could not be tested given blood in the urine -WBC went from 14.8 and is now trending down to 12.2 -Repeat urine cultures pending we will change to p.o. cefadroxil and will need outpatient follow-up for further monitoring and continuing  antibiotics for cellulitis for 14 days total treatment  Hypokalemia -Patient's potassium is now 3.3 -Replete with p.o. KCl 40 mEq twice daily x2 doses -Continue monitor and trend and repeat CMP in a.m.  Hypomagnesemia -Patient's mag level is now 1.6 -Replete with IV mag sulfate 2 g -Continue monitor and replete as necessary -Repeat mag level within 1 week   Essential hypertension Stable. Pt was not receiving losartan during her hospital stay 9-25 to 10-3.  Likely her AKI due to poor po liquid intake and we will continue to hold naproxen medications -Continue monitor blood pressures per protocol -Last blood pressure reading was 147/94   ABLA (acute blood loss anemia) -S/p EGD on 07-17-2022 that was negative as source of acute blood loss anemia that pt was initially admitted for 07-10-2022. -CT scan done last admission and showed "Redemonstrated high-density nonenhancing filling defect throughout the right renal collecting system and renal pelvis, unchanged to slightly progressed compared to the 07/12/2022 examination and while indeterminate is favored to represent blood clot, the etiology of which is again not depicted on the present examination. Further evaluation with ureteroscopy could be performed as indicated. Mild right-sided pelvicaliectasis without asymmetrically delayed renal enhancement. 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 Abdominal Aortic Aneurysm.  Recommend follow-up aortic ultrasound in 2 years. Suspected hemodynamically significant narrowings involving left common iliac artery as well as the bilateral external iliac and imaged portions of the bilateral superficial femoral arteries. Correlation for symptoms of PAD is advised. Aortic Atherosclerosis. 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 Lorriane Shire next week   Hypoalbuminemia -Patient's albumin level of 3.1 -> 2.7 -> 2.6 yesterday and today is now 3.7 -Continue to monitor trend and repeat CMP in a.m.   Obesity -Complicates overall prognosis and care -Estimated body mass index is 34.02 kg/m as calculated from the following:   Height as of this encounter: '5\' 4"'$  (1.626 m).   Weight as of this encounter: 89.9 kg.  -Weight Loss and Dietary Counseling given  Consultants: Urology  Procedures performed: As above  Disposition: Skilled nursing facility Diet recommendation:  Discharge Diet Orders (From admission, onward)     Start     Ordered   07/21/22 0000  Diet - low sodium heart healthy        07/21/22 1459           Cardiac diet DISCHARGE MEDICATION: Allergies as of 07/21/2022   No Known Allergies      Medication List     TAKE these medications    acetaminophen 500 MG tablet Commonly known as: TYLENOL Take 500 mg by mouth every 6 (six) hours as needed.   amLODipine 5 MG tablet Commonly known as: NORVASC Take 5 mg by mouth daily.   atorvastatin 40 MG tablet Commonly known as: LIPITOR Take 40 mg by mouth at bedtime.   carvedilol 25 MG tablet Commonly known as: COREG Take 25 mg by mouth 2 (two) times daily with a meal.   cefadroxil 500 MG capsule Commonly known as: DURICEF Take 1 capsule (500 mg total) by mouth 2 (two) times daily.   iron polysaccharides 150 MG capsule Commonly known as: NIFEREX Take 1 capsule (150 mg total) by  mouth daily. Start taking on: July 22, 2022   meclizine 12.5 MG tablet Commonly known as: ANTIVERT Take 12.5 mg by mouth 2 (two) times daily.   nitroGLYCERIN 0.4 MG SL tablet Commonly known as: NITROSTAT Place under the tongue.   ondansetron 4 MG tablet Commonly known as: ZOFRAN Take 1 tablet (4 mg total) by mouth every 6 (six) hours as needed for nausea.   oxyCODONE 5 MG immediate release tablet Commonly known as: Oxy IR/ROXICODONE Take 1 tablet (5 mg total) by mouth every 6 (six) hours as needed for up to 3 days for moderate pain or breakthrough pain. What changed: when to take this   pantoprazole 40 MG tablet Commonly known as: PROTONIX Take 1 tablet (40 mg total) by mouth daily.   warfarin 4 MG tablet Commonly known as: COUMADIN Take 1 tablet (4 mg total) by mouth daily at 4 PM. 4 mg daily and follow up with Dr. Terrence Dupont within 1 week What changed:  when to take this additional instructions Another medication with the same name was removed. Continue taking this medication, and follow the directions you see here.        Follow-up Information     Ceasar Mons, MD. Schedule an appointment as soon as possible for a visit in 1 month(s).   Specialty: Urology Contact information: [place] [address] [phone]                Discharge Exam: Filed Weights   07/19/22 0500 07/20/22 0458 07/21/22 0627  Weight: 88.7 kg 93.2 kg 89.9 kg   Vitals:   07/21/22 0743 07/21/22 1407  BP: (!) 144/94 (!) 147/94  Pulse: 84 75  Resp:  18  Temp:  98.8 F (37.1 C)  SpO2: 100%    Examination: Physical Exam:  Constitutional: WN/WD obese African-American female currently no acute distress Respiratory: Diminished to auscultation bilaterally, no wheezing, rales, rhonchi or crackles. Normal respiratory effort and patient is not tachypenic. No accessory muscle use.  Unlabored breathing Cardiovascular: RRR, no murmurs / rubs /  gallops. S1 and S2 auscultated. No extremity edema.  Abdomen: Soft, non-tender, distended secondary body habitus. bowel sounds positive.  GU: Deferred. Musculoskeletal: No clubbing / cyanosis of digits/nails. No joint deformity upper and lower extremities.  Skin: No rashes, lesions, ulcers on limited skin evaluation. No induration; Warm and dry.  Neurologic: CN 2-12 grossly intact with no focal deficits. Romberg sign and cerebellar reflexes not assessed.  Psychiatric: Normal judgment and insight. Alert and oriented x 3. Normal mood and appropriate affect.   Condition at discharge: stable  The results of significant diagnostics from this hospitalization (including imaging, microbiology, ancillary and laboratory) are listed below for reference.   Imaging Studies: DG Knee Complete 4 Views Left  Result Date: 07/18/2022 CLINICAL DATA:  Fell, weakness EXAM: LEFT KNEE - COMPLETE 4+ VIEW COMPARISON:  None Available. FINDINGS: Frontal, bilateral oblique, and cross-table lateral views of the left knee are obtained. No acute fracture, subluxation, or dislocation. 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 No joint effusion. Soft tissues are unremarkable. IMPRESSION: 1. Moderate 3 compartmental osteoarthritis.  No acute fracture. Electronically Signed   By: Randa Ngo M.D.   On: 07/18/2022 17:26   DG Ankle Complete Left  Result Date: 07/18/2022 CLINICAL DATA:  [place], swelling EXAM: LEFT ANKLE COMPLETE - 3+ VIEW COMPARISON:  None Available. FINDINGS: Frontal, oblique, and lateral views of the left ankle are obtained. There are no acute displaced fractures. Alignment is anatomic. Mild osteoarthritis of the ankle and hindfoot. Mild anterior soft tissue swelling. IMPRESSION: 1. Mild anterior soft tissue swelling.  No acute displaced fracture. Electronically Signed   By: Randa Ngo M.D.   On: 07/18/2022 17:25   CT ABDOMEN PELVIS W CONTRAST  Result Date: 07/15/2022 CLINICAL DATA:  Follow-up right  renal hematoma/lesion. EXAM: CT ABDOMEN AND PELVIS WITH CONTRAST TECHNIQUE: Multidetector CT imaging of the abdomen and pelvis was performed using the standard protocol following bolus administration of intravenous contrast. RADIATION DOSE REDUCTION: This exam was performed according to the departmental dose-optimization program which includes automated exposure control, adjustment of the mA and/or kV according to patient size and/or use of iterative reconstruction technique. CONTRAST:  173m OMNIPAQUE IOHEXOL 300 MG/ML  SOLN COMPARISON:  07/12/2022; 07/10/2022 FINDINGS: Lower chest: Limited visualization of the lower thorax demonstrates minimal bibasilar subsegmental atelectasis, right greater than left. Note is again made of a approximately 3.7 x 2.2 cm bulla/cyst within the imaged left lower  lobe (image 47, series 4). Normal heart size. Coronary artery calcifications. Calcifications within the aortic valve leaflets. No pericardial effusion though small amount of fluid is seen with the pericardial recess. Hepatobiliary: Normal hepatic contour. There is a subcentimeter hypoattenuating lesion with the anterior segment of the right lobe of the liver (image 25, series 2) which is too small to accurately characterize though favored to represent a hepatic cyst. Post cholecystectomy. No intra or extrahepatic biliary ductal dilatation. No ascites. Pancreas: Normal appearance of the pancreas. Spleen: Normal appearance of the spleen. Adrenals/Urinary Tract: Redemonstrated high density nonenhancing filling defects, presumably blood clot, within the right renal collecting system, slightly progressed compared to the 07/12/2022 examination again, the etiology of which is not depicted on this examination. Mild right-sided pelvicaliectasis without asymmetric perinephric stranding. There is preserved symmetric enhancement of the bilateral renal parenchyma. Unchanged approximately 1.8 cm hypoattenuating nonenhancing cyst within the  interpolar aspect of the left kidney (coronal image 71, series 5). Additional bilateral subcentimeter hypoattenuating renal lesions are too small to accurately characterize though favored to represent additional renal cysts. There is mild thickening of the bilateral adrenal glands without discrete nodule. Normal appearance of the urinary bladder given underdistention. Stomach/Bowel: Ingested enteric contrast extends to the level of the distal small bowel. No evidence of enteric obstruction. Previously questioned focal outpouching involving degraded curvature of the stomach is again not demonstrated on the present examination. There is persistent elevation/eventration of the left hemidiaphragm. No hiatal hernia. Normal appearance of the terminal ileum and the retrocecal appendix. No pneumoperitoneum, pneumatosis or portal venous gas. Vascular/Lymphatic: Redemonstrated aneurysmal dilatation of the infrarenal abdominal aorta measuring 36 mm in diameter (image 52, series 2). 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 There is a large amount of slightly irregular mixed calcified and noncalcified atherosclerotic plaque throughout the abdominal aorta, not resulting in hemodynamically significant stenosis. Large amount of irregular mixed calcified and noncalcified atherosclerotic plaque throughout the left common iliac artery, the bilateral external iliac arteries as well as the imaged portions of the bilateral superficial femoral arteries likely resulting in tandem areas of hemodynamically significant narrowing, incompletely evaluated on this non CTA examination. No bulky retroperitoneal, mesenteric, pelvic or inguinal lymphadenopathy. Reproductive: Normal appearance of the pelvic organs for age. No free fluid the pelvic cul-de-sac. Other: Tiny mesenteric fat containing periumbilical hernia. Right-sided gluteal calcifications. Musculoskeletal: No acute or aggressive osseous  abnormalities. Moderate to severe multilevel lumbar spine DDD, likely worse at L5-S1 with disc space height loss, endplate irregularity and posteriorly directed disc osteophyte complex at this location. IMPRESSION: 1. Redemonstrated high-density nonenhancing filling defect throughout the right renal collecting system and renal pelvis, unchanged to slightly progressed compared to the 07/12/2022 examination and while indeterminate is favored to represent blood clot, the etiology of which is again not depicted on the present examination. Further evaluation with ureteroscopy could be performed as indicated. 2. Mild right-sided pelvicaliectasis without asymmetrically delayed renal enhancement. 3. 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 Abdominal Aortic Aneurysm (ICD10-I71.9). Recommend follow-up aortic ultrasound in 2 years. This recommendation follows ACR consensus guidelines: White Paper of the ACR Incidental Findings Committee II on Vascular Findings. J Am Coll Radiol 2013; 10:789-794. 4. Suspected hemodynamically significant narrowings involving left common iliac artery as well as the bilateral external iliac and imaged portions of the bilateral superficial femoral arteries. Correlation for symptoms of PAD is advised. Aortic Atherosclerosis (ICD10-I70.0). 5. Previously questioned masslike  area involving the greater curvature of the stomach is again not demonstrated on the present examination and thus may be artifactual due to associated diaphragmatic hernia/eventration. Electronically Signed   By: Sandi Mariscal M.D.   On: 07/15/2022 16:40   CT ABDOMEN PELVIS W WO CONTRAST  Result Date: 07/12/2022 CLINICAL DATA:  Hematuria, right-sided abdominal pain radiating to the right leg. EXAM: CT ABDOMEN AND PELVIS WITHOUT AND WITH CONTRAST TECHNIQUE: Multidetector CT imaging of the abdomen and pelvis was performed following the standard  protocol before and following the bolus administration of intravenous contrast. RADIATION DOSE REDUCTION: This exam was performed according to the departmental dose-optimization program which includes automated exposure control, adjustment of the mA and/or kV according to patient size and/or use of iterative reconstruction technique. CONTRAST:  174m OMNIPAQUE IOHEXOL 300 MG/ML  SOLN COMPARISON:  07/10/2022. FINDINGS: Lower chest: Bibasilar volume loss, right greater than left. Heart size within normal limits. Atherosclerotic calcification of the aorta and coronary arteries. No pericardial or pleural effusion. Distal esophagus is grossly unremarkable. Hepatobiliary: Liver is decreased in attenuation diffusely. Cholecystectomy. No biliary ductal dilatation. Pancreas: Negative. Spleen: Negative. Adrenals/Urinary Tract: Right adrenal gland is unremarkable. Nodular thickening of the medial limb left adrenal gland, unchanged. No specific follow-up necessary. Right renal edema with subtle decreased attenuation of the right renal parenchyma on portal venous phase imaging. High attenuation filling defect in the upper pole calices (29/67 does not appear to enhance, measuring at least 1.5 x 1.8 cm (10/11). An additional linear filling defect is seen within the right renal pelvis on delayed imaging (10/15). Associated fullness of the right intrarenal collecting system and right renal pelvis. Poor opacification of the visualized portion of the upper right ureter. 2.2 cm nonenhancing cyst in interpolar left kidney (4/30). No follow-up necessary. Additional subcentimeter low-attenuation lesions in the kidneys are too small to characterize. There may be a punctate stone in the upper pole left kidney. No specific follow-up necessary. Ureters are decompressed. Bladder is grossly unremarkable. Stomach/Bowel: There may be mild proximal gastric wall thickening. Previously questioned masslike area along the greater curvature of the  stomach is not readily appreciated. Small bowel, appendix and colon are unremarkable. Vascular/Lymphatic: Abdominal aorta measures 3.3 cm at the level of the superior mesenteric artery and 3.6 cm below the level of the renal arteries. Atherosclerotic calcification of the aorta. Right common iliac artery measures 2.5 cm. No pathologically enlarged lymph nodes. Reproductive: Uterus is visualized. No adnexal mass. 2.2 cm left perineal cyst. Other: No free fluid. Mesenteries and peritoneum are unremarkable. Elevated left hemidiaphragm. Musculoskeletal: Degenerative changes in the spine. No worrisome lytic or sclerotic lesions. IMPRESSION: 1. High attenuation caliceal filling defect in the upper pole right kidney without enhancement, with an additional linear filling defect in the right renal pelvis, findings indicative of hemorrhage/clot. Difficult to definitively exclude malignancy. Associated fullness of the right renal pelvis and right intrarenal collecting system. 2. Possible punctate stone in the upper pole left kidney. 3. Probable proximal gastric wall thickening. Previously questioned masslike area within the greater curvature of the gastric fundus is not readily appreciated. 4. Hepatic steatosis. 5. 3.6 cm infrarenal aortic aneurysm. Recommend follow-up every 2 years. This recommendation follows ACR consensus guidelines: White Paper of the ACR Incidental Findings Committee II on Vascular Findings. J Am Coll Radiol 2013; 10:789-794. 6. Aortic atherosclerosis (ICD10-I70.0). Coronary artery calcification. Right common iliac artery aneurysm. Electronically Signed   By: MLorin PicketM.D.   On: 07/12/2022 08:38   [place]PELV[place] COMPLETE W TRANSVAGINAL AND  TORSION R/O  Result Date: 07/10/2022 CLINICAL DATA:  Pelvic pain and vaginal bleeding EXAM: TRANSABDOMINAL AND TRANSVAGINAL ULTRASOUND OF PELVIS DOPPLER ULTRASOUND OF OVARIES TECHNIQUE: Both transabdominal and transvaginal ultrasound examinations of the pelvis were  performed. Transabdominal technique was performed for global imaging of the pelvis including uterus, ovaries, adnexal regions, and pelvic cul-de-sac. It was necessary to proceed with endovaginal exam following the transabdominal exam to visualize the uterus, endometrium, ovaries, and adnexa. Color and duplex Doppler ultrasound was utilized to evaluate blood flow to the ovaries. COMPARISON:  Pelvic ultrasound 06/17/2020 and same day CT abdomen and pelvis FINDINGS: Uterus Measurements: 6.2 x 3.6 x 4.9 cm = volume: 56 mL. Round heterogenous predominantly hypoechoic mass within the anterior uterine fundus measuring 1.8 x 1.4 x 1.7 cm compatible with fibroid. Endometrium Thickness: 1.3 mm.  No focal abnormality visualized. Right ovary Not visualized Left ovary Not visualized Pulsed Doppler evaluation of both ovaries demonstrates normal low-resistance arterial and venous waveforms. Other findings No abnormal free fluid. IMPRESSION: 1. No acute sonographic abnormality in the pelvis. 2. Fibroid within the anterior uterine fundus with some mild distortion of the endometrial canal. 3. No focal endometrial abnormality or significant endometrial thickening. 4. Nonvisualization of the ovaries. Electronically Signed   By: Placido Sou M.D.   On: 07/10/2022 20:01   CT Renal Stone Study  Result Date: 07/10/2022 CLINICAL DATA:  Lower abdominal pain with hematuria x3 days. EXAM: CT ABDOMEN AND PELVIS WITHOUT CONTRAST TECHNIQUE: Multidetector CT imaging of the abdomen and pelvis was performed following the standard protocol without IV contrast. RADIATION DOSE REDUCTION: This exam was performed according to the departmental dose-optimization program which includes automated exposure control, adjustment of the mA and/or kV according to patient size and/or use of iterative reconstruction technique. COMPARISON:  CT December 10, 2017. FINDINGS: Lower chest: No acute abnormality. Hepatobiliary: Unremarkable noncontrast enhanced  appearance of the hepatic parenchyma. Gallbladder surgically absent. No biliary ductal dilation. Pancreas: No pancreatic ductal dilation or evidence of acute inflammation. Spleen: No splenomegaly. Adrenals/Urinary Tract: Bilateral adrenal glands are within normal limits. No hydronephrosis. Nonobstructive punctate left upper/interpolar renal stone. Hypodense 2.1 cm left renal lesion measures fluid density consistent with a cyst and considered benign requiring no independent imaging follow-up. Subcentimeter hypodense right renal lesion measures 9 mm on image 30/2, technically too small to accurately characterize but statistically likely to reflect a cyst. No renal, ureteral bladder calculi. Gas in the urinary bladder recommend correlation with history of recent instrumentation. Stomach/Bowel: No radiopaque enteric contrast material was administered. Masslike lobular area along the greater curvature of the gastric fundus best seen on coronal image 80/4 measuring 2.9 x 3.9 x 3.7 cm on images 18/2 and 80/4. No pathologic dilation of small or large bowel. Appendix and terminal ileum appear normal. Colonic diverticulosis without findings of acute diverticulitis. Vascular/Lymphatic: Aneurysmal dilation of the infrarenal abdominal aorta measures 3.4 cm on image 47/2 previously measuring 3.1 cm on CT December 10, 2017. 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 No pathologically enlarged abdominal or pelvic lymph nodes. Reproductive: Uterus and bilateral adnexa are unremarkable. Other: No significant abdominopelvic free fluid. Musculoskeletal: No acute osseous abnormality. Multilevel degenerative changes spine. IMPRESSION: 1. Nonobstructive punctate left upper/interpolar renal stone. No obstructive ureteral or bladder calculi. 2. Masslike 3.9 cm lobular exophytic area along the greater curvature of the gastric fundus is nonspecific possibly reflecting a focal  outpouching and fold thickening but is suspicious for a discrete gastric mass such  as a gastrointestinal stromal tumor. Suggest gastroenterology consult and further evaluation with nonemergent CT with oral and IV contrast versus upper endoscopy. 3. Colonic diverticulosis without findings of acute diverticulitis. 4. Increased size of the infrarenal abdominal aortic aneurysm now measuring 3.4 cm Recommend follow-up ultrasound every 3 years. This recommendation follows ACR consensus guidelines: White Paper of the ACR Incidental Findings Committee II on Vascular Findings. J Am Coll Radiol 2013; 10:789-794. 5. 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 6.  Aortic Atherosclerosis (ICD10-I70.0). Electronically Signed   By: Dahlia Bailiff M.D.   On: 07/10/2022 14:14    Microbiology: Results for orders placed or performed during the hospital encounter of 07/18/22  Urine Culture     Status: None (Preliminary result)   Collection Time: 07/18/22  9:31 PM   Specimen: Urine, Clean Catch  Result Value Ref Range Status   Specimen Description   Final    URINE, CLEAN CATCH Performed at [facility], [place] [address], [place], [place] [address]    Special Requests   Final    NONE Performed at [facility], [place] [address], [place], [place] [address]    Culture   Final    CULTURE REINCUBATED FOR BETTER GROWTH Performed at [facility], [place] [address], [place], [place] [address]    Report Status PENDING  Incomplete   Labs: CBC: Recent Labs  Lab 07/17/22 0423 07/18/22 1728 07/19/22 0448 07/20/22 0454 07/21/22 0850  WBC 13.1* 14.8* 14.1* 12.2* 11.8*  NEUTROABS 8.8* 11.6* 9.9* 7.8* 7.7  HGB 8.9* 9.1* 8.0* 8.1* 8.0*  HCT 28.7* 29.2* 25.7* 26.3* 25.7*  MCV 86.7 86.1 86.5 86.8 86.8  PLT 233 223 206 228 630   Basic Metabolic Panel: Recent Labs  Lab 07/18/22 1728 07/19/22 0448 07/20/22 0454 07/21/22 0850  NA 138 141 145 143   K 5.2* 4.1 3.8 3.3*  CL 109 113* 115* 111  CO2 '24 24 23 23  '$ GLUCOSE 129* 96 92 102*  BUN 50* 42* 25* 15  CREATININE 2.35* 1.71* 1.10* 0.92  CALCIUM 8.7* 8.1* 8.2* 7.8*  MG  --   --  2.1 1.6*  PHOS  --   --  2.2* 2.8   Liver Function Tests: Recent Labs  Lab 07/18/22 1728 07/19/22 0448 07/20/22 0454 07/21/22 0850  AST 16 14* 14* 14*  ALT '16 14 15 16  '$ ALKPHOS 61 55 53 50  BILITOT 0.8 0.8 0.7 0.7  PROT 7.0 6.3* 6.2* 6.0*  ALBUMIN 3.1* 2.7* 2.6* 2.7*   CBG: No results for input(s): "GLUCAP" in the last 168 hours.  Discharge time spent: greater than 30 minutes.  Signed: Raiford Noble, DO [facility] 07/21/2022

## 2022-07-21 NOTE — TOC Progression Note (Signed)
Transition of Care [facility]) - Progression Note    Patient Details  Name: Kristi Meyers MRN: [id number] Date of Birth: [birth year]  [facility] CM/SW Contact  Leeroy Cha, RN Phone Number: 07/21/2022, 10:43 AM  Clinical Narrative:    Snf stay approved through Bernadene Bell [phone]   Expected Discharge Plan: [place] Barriers to Discharge: Continued Medical Work up  Expected Discharge Plan and Services Expected Discharge Plan: [place] In-house Referral: NA Discharge Planning Services: NA Post Acute Care Choice: [place] Living arrangements for the past 2 months: Apartment                 DME Arranged: N/A DME Agency: NA       HH Arranged: NA HH Agency: NA         Social Determinants of Health (SDOH) Interventions    Readmission Risk Interventions   No data to display

## 2022-07-21 NOTE — Progress Notes (Signed)
This CSW reached out to [facility] as they currently do not have any open beds until Monday or Tuesday, CSW also called [facility] which the rep was gone for the day and also called Janie from [facility] also N/A. TOC will update with information as it comes.

## 2022-07-22 LAB — URINE CULTURE: Culture: 20000 — AB

## 2022-07-22 LAB — PROTIME-INR
INR: 1.7 — ABNORMAL HIGH (ref 0.8–1.2)
Prothrombin Time: 20.1 seconds — ABNORMAL HIGH (ref 11.4–15.2)

## 2022-07-22 MED ORDER — BISACODYL 10 MG RE SUPP: 10.0000 mg | Freq: Every day | RECTAL | Status: DC | PRN

## 2022-07-22 MED ORDER — POLYETHYLENE GLYCOL 3350 17 G PO PACK
17.0000 g | PACK | Freq: Two times a day (BID) | ORAL | Status: DC
  Administered 2022-07-22: 17 g via ORAL
  Filled 2022-07-22: qty 1

## 2022-07-22 MED ORDER — POLYETHYLENE GLYCOL 3350 17 G PO PACK: 17.0000 g | PACK | Freq: Two times a day (BID) | ORAL | 0 refills | Status: AC

## 2022-07-22 MED ORDER — SENNOSIDES-DOCUSATE SODIUM 8.6-50 MG PO TABS: 1.0000 | ORAL_TABLET | Freq: Every day | ORAL | Status: AC

## 2022-07-22 MED ORDER — FOSFOMYCIN TROMETHAMINE 3 G PO PACK
3.0000 g | PACK | Freq: Once | ORAL | Status: DC
  Filled 2022-07-22: qty 3

## 2022-07-22 MED ORDER — SENNOSIDES-DOCUSATE SODIUM 8.6-50 MG PO TABS
1.0000 | ORAL_TABLET | Freq: Two times a day (BID) | ORAL | Status: DC
  Administered 2022-07-22: 1 via ORAL
  Filled 2022-07-22: qty 1

## 2022-07-22 NOTE — Discharge Summary (Addendum)
Physician Discharge Summary   Patient: Kristi Meyers MRN: [id number] DOB: [birth year]  Admit date:     07/18/2022  Discharge date: 07/22/22  Discharge Physician: Raiford Noble, DO    PCP: Billie Ruddy, MD   Recommendations at discharge:   Follow-up with PCP within 1 to 2 weeks and repeat CBC, CMP, mag, Phos within 1 week Follow-up with cardiology Dr. 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 Could not afford Eliquis.  Resolved Problems:   * No resolved hospital problems. [facility] Course: HPI per Dr. 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  Patient was discharged today around 1 PM.  Patient had been hospitalized from 9/25 through 07/18/2022 due to gross hematuria and acute blood loss anemia.   When she got home with her daughter, patient was trying to get into her house.  She fell outside the front door of her house.  Daughter [name]) states that patient got weak in her legs and could not support her weight.  She did not hit her head.  Patient brought back to the ER at 4:20 PM.   On arrival temp 97.6 heart rate 64 blood pressure 130/80 satting 95% on room air.   Labs show sodium 138, potassium 5.2, BUN of 50, creatinine 2.35   Last chemistry that was done in the hospital on 07/11/2022 showed a BUN of 21, creatinine 0.95.   CBC showed a white count of 14.8, hemoglobin 9.1,  platelets 223   Left ankle x-ray negative for fracture.   Due to AKI and patient inability to walk, [facility] hospitalist contacted for admission.    **Interim History  Renal function is improving and hematuria is clearing.  Case was discussed with urology who recommends no acute surgical intervention warranted at this time and recommending a CTA/embolization only if necessary if she starts actively hemorrhaging or becomes profoundly hemodynamically unstable and they are planning for outpatient cystoscopy in the coming weeks.  She is improved and her hematuria is improving and after discussion with urology it is okay to resume her anticoagulation with Coumadin we will place her back on 4 mg per cardiology direction.  She will follow-up with Dr. Terrence Dupont in the outpatient setting and have a repeat INR check on Monday or Tuesday of the coming week.  She is medically stable for discharge  ADDENDUM 07/22/2022: patient did not unfortunately yesterday given that she actually had no bed availability at her [facility].  She is now out of bed at [facility] and remains medically stable for discharge.  She denies any other concerns or complaints  Assessment and Plan: AKI (acute kidney injury) ([place]) -Admit to med/surg bed. Continue with IVF. Repeat BMP. Likely due to poor po intake. -Patient is BUN/creatinine has gone from 50/2.35 is now 42/1.71 and BUN/creatinine is now 25/1.10 yesterday and today is now 15/0.90 and improving -Avoid further nephrotoxic medications, contrast dyes,  hypotension and dehydration to ensure adequate renal perfusion and will need to renally dose medications -Continue IV fluid hydration with normal saline at 75 MLS per and will stop today -Continue to monitor and trend renal function carefully repeat CMP in the a.m. -We will repeat CTA of the abdomen pelvis if renal function improves further and will need to discuss with interventional radiology   Impaired  ambulation -Does not appear that pt walked with PT during her 7 day hospital stay from 9-25 to 10-3. Will need PT consult. TOC consult for SNF placement. Dtr(tiffany) says pt was already using walker(in the house) and cane(outside the house) for assistance. Mostly for balance. -PT OT recommending SNF now I have ordered her a left ankle brace given that the patient has pain left lateral ankle and she stated that she rolled it inward during her fall prior to admission   Long term (current) use of anticoagulants - 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 Could not afford Eliquis. -Remains on coumadin. Could not afford Eliquis. Pharmacy consult for inr management and will hold Coumadin given that she still have some hematuria and will continue monitor closely as this is improving   Hyperlipidemia, unspecified -Continue with lipitor 40 mg qhs.   Gross hematuria, improving  -Holding her anticoagulation -Discussed with urology who recommends IR consultation for possible embolectomy versus evaluation for possible repeat CT a scan to localize bleeding; given that she is improving we will hold off and only time we will get a CTA as if she worsens or is becomes hemodynamically unstable -Per urology no need for Foley catheter at this time and monitor closely -Gross hematuria is improving and urology recommends no acute urological surgical intervention at this time and recommending following up with [facility] in outpatient setting for cystoscopy to complete the gross hematuria work-up and the patient has significant gross hematuria with concern for acute bleed they may consider CTA to localize the bleed versus an IR consultation for possible embolization -Okay to resume anticoagulation per urology and follow-up in outpatient setting for cystoscopy   Recent E. coli UTI -Discussed with urology who recommends extending antibiotic course and we have initiated her back on IV ceftriaxone -Repeat urinalysis  and urine culture pending and urinalysis could not be tested given blood in the urine -WBC went from 14.8 and is now trending down to 11.8 on last check -Repeat urine cultures pending we will change to p.o. cefadroxil and will need outpatient follow-up for further monitoring and continuing antibiotics for UTI for 14 days total treatment however I spoke with Dr. Gale Journey of ID and he feels that she has been adequately treated and does not need any further treatment  -Repeat urine culture shows 20,000 colony-forming units of Enterococcus avium but after ID waiting they feel that this is significant and does not need to be treated  Hypokalemia -Patient's potassium is now 3.3 -Replete with p.o. KCl 40 mEq twice daily x2 doses -Continue monitor and trend and repeat CMP in a.m.  Hypomagnesemia -Patient's mag level is now 1.6 -Replete with IV mag sulfate 2 g -Continue monitor and replete as necessary -Repeat mag level within 1 week   Essential hypertension Stable. Pt was not receiving losartan during her hospital stay 9-25 to 10-3.  Likely her AKI due to poor po liquid intake and we will continue to hold naproxen medications -Continue monitor blood pressures per protocol -Last blood pressure reading was 147/94   ABLA (acute blood loss anemia) -S/p EGD on  07-17-2022 that was negative as source of acute blood loss anemia that pt was initially admitted for 07-10-2022. -CT scan done last admission and showed "Redemonstrated high-density nonenhancing filling defect throughout the right renal collecting system and renal pelvis, unchanged to slightly progressed compared to the 07/12/2022 examination and while indeterminate is favored to represent blood clot, the etiology of which is again not depicted on the present examination. Further evaluation with ureteroscopy could be performed as indicated. Mild right-sided pelvicaliectasis without asymmetrically delayed renal enhancement. 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 Abdominal Aortic Aneurysm.  Recommend follow-up aortic ultrasound in 2 years. Suspected hemodynamically significant narrowings involving left common iliac artery as well as the bilateral external iliac and imaged portions of the bilateral superficial femoral arteries. Correlation for symptoms of PAD is advised. Aortic Atherosclerosis. 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 Lorriane Shire next week   Hypoalbuminemia -Patient's albumin level of 3.1 -> 2.7 -> 2.6 yesterday and today is now 3.7 -Continue to monitor trend and repeat CMP in a.m.  Constipation -Initiate bowel regimen placed on MiraLAX 17 g p.o. twice daily as well as senna docusate 1 tab p.o. twice daily and also have a bisacodyl 10 mg suppository if necessary   Obesity -Complicates overall prognosis and care -Estimated body mass index is 33.49 kg/m as calculated from the following:   Height as of this encounter: '5\' 4"'$  (1.626 m).   Weight as of this encounter: 88.5 kg.  -Weight Loss and Dietary Counseling given  Consultants: Urology  Procedures performed: As above  Disposition: Skilled nursing facility Diet recommendation:  Discharge Diet Orders (From admission, onward)     Start     Ordered   07/21/22 0000  Diet - low sodium heart healthy        07/21/22 1459            Cardiac diet DISCHARGE MEDICATION: Allergies as of 07/22/2022   No Known Allergies      Medication List     TAKE these medications    acetaminophen 500 MG tablet Commonly known as: TYLENOL Take 500 mg by mouth every 6 (six) hours as needed.   amLODipine 5 MG tablet Commonly known as: NORVASC Take 5 mg by mouth daily.   atorvastatin 40 MG tablet Commonly known as: LIPITOR Take 40 mg by mouth at bedtime.   carvedilol 25 MG tablet Commonly known as: COREG Take 25 mg by mouth 2 (two) times daily with a meal.   iron polysaccharides 150 MG capsule Commonly known as: NIFEREX Take 1 capsule (150 mg total) by mouth daily.   meclizine 12.5 MG tablet Commonly known as: ANTIVERT Take 12.5 mg by mouth 2 (two) times daily.   nitroGLYCERIN 0.4 MG SL tablet Commonly known as: NITROSTAT Place under the tongue.   ondansetron 4 MG tablet Commonly known as: ZOFRAN Take 1 tablet (4 mg total) by mouth every 6 (six)  hours as needed for nausea.   oxyCODONE 5 MG immediate release tablet Commonly known as: Oxy IR/ROXICODONE Take 1 tablet (5 mg total) by mouth every 6 (six) hours as needed for up to 3 days for moderate pain or breakthrough pain. What changed: when to take this   pantoprazole 40 MG tablet Commonly known as: PROTONIX Take 1 tablet (40 mg total) by mouth daily.   polyethylene glycol 17 g packet Commonly known as: MIRALAX / GLYCOLAX Take 17 g by mouth 2 (two) times daily.   senna-docusate 8.6-50 MG tablet Commonly known as: Senokot-S Take 1 tablet by mouth at bedtime.   warfarin 4 MG tablet Commonly known as: COUMADIN Take 1 tablet (4 mg total) by mouth daily at 4 PM. 4 mg daily and follow up with Dr. Terrence Dupont within 1 week What changed:  when to take this additional instructions Another medication with the same name was removed. Continue taking this medication, and follow the directions you see here.        Follow-up Information     Ceasar Mons, MD. Schedule an appointment as soon as possible for a visit in 1 month(s).   Specialty: Urology Contact information: [place] [address] [phone]                Discharge Exam: Filed Weights   07/20/22 0458 07/21/22 0627 07/22/22 0500  Weight: 93.2 kg 89.9 kg 88.5 kg   Vitals:   07/22/22 0623 07/22/22 1520  BP: (!) 156/92 (!) 155/91  Pulse: 84 80  Resp: 16 18  Temp: 98.4 F (36.9 C) 98.3 F (36.8 C)  SpO2: 95% 98%   Examination: Physical Exam:  Constitutional: WN/WD obese African-American female currently no acute distress Respiratory: Diminished to auscultation bilaterally, no wheezing, rales, rhonchi or crackles. Normal respiratory effort and patient is not tachypenic. No accessory muscle use.  Unlabored breathing Cardiovascular: RRR, no murmurs / rubs / gallops. S1 and S2 auscultated. No extremity edema.  Abdomen: Soft, non-tender, distended secondary body habitus. bowel sounds positive.  GU: Deferred. Musculoskeletal: No clubbing / cyanosis of digits/nails. No joint deformity upper and lower extremities.  Skin: No rashes, lesions, ulcers on limited skin evaluation. No induration; Warm and dry.  Neurologic: CN 2-12 grossly intact with no focal deficits. Romberg sign and cerebellar reflexes not assessed.  Psychiatric: Normal judgment and insight. Alert and oriented x 3. Normal mood and appropriate affect.   Condition at discharge: stable  The results of significant diagnostics from this hospitalization (including imaging, microbiology, ancillary and laboratory) are listed below for reference.   Imaging Studies: DG Knee Complete 4 Views Left  Result Date: 07/18/2022 CLINICAL DATA:  Fell, weakness EXAM: LEFT KNEE - COMPLETE 4+ VIEW COMPARISON:  None Available. FINDINGS: Frontal, bilateral oblique, and cross-table lateral views of the left knee are obtained. No acute fracture, subluxation, or dislocation. There is  moderate 3 compartmental osteoarthritis. No joint effusion. Soft tissues are unremarkable. IMPRESSION: 1. Moderate 3 compartmental osteoarthritis.  No acute fracture. Electronically Signed   By: Randa Ngo M.D.   On: 07/18/2022 17:26   DG Ankle Complete Left  Result Date: 07/18/2022 CLINICAL DATA:  [place], swelling EXAM: LEFT ANKLE COMPLETE - 3+ VIEW COMPARISON:  None Available. FINDINGS: Frontal, oblique, and lateral views of the left ankle are obtained. There are no acute displaced fractures. Alignment is anatomic. Mild osteoarthritis of the ankle and hindfoot. Mild anterior soft tissue swelling. IMPRESSION: 1. Mild anterior soft tissue swelling.  No acute displaced fracture. Electronically Signed   By: Randa Ngo M.D.   On: 07/18/2022 17:25   CT ABDOMEN PELVIS W CONTRAST  Result Date: 07/15/2022 CLINICAL DATA:  Follow-up right renal hematoma/lesion. EXAM: CT ABDOMEN AND PELVIS WITH CONTRAST TECHNIQUE: Multidetector CT imaging of the abdomen and pelvis was performed using the standard protocol following bolus administration of intravenous contrast. RADIATION DOSE REDUCTION: This exam was performed according to the departmental dose-optimization program which includes automated exposure control, adjustment of the mA and/or kV according to patient size and/or use of iterative reconstruction technique. CONTRAST:  158m OMNIPAQUE IOHEXOL 300 MG/ML  SOLN COMPARISON:  07/12/2022; 07/10/2022 FINDINGS: Lower chest: Limited visualization of the lower thorax demonstrates minimal bibasilar subsegmental atelectasis, right greater than left. Note is again made of a approximately 3.7 x 2.2 cm bulla/cyst within the imaged left lower lobe (image 47, series 4). Normal heart size. Coronary artery calcifications. Calcifications within the aortic valve leaflets. No pericardial effusion though small amount of fluid is seen with the pericardial recess. Hepatobiliary: Normal hepatic contour. There is a subcentimeter  hypoattenuating lesion with the anterior segment of the right lobe of the liver (image 25, series 2) which is too small to accurately characterize though favored to represent a hepatic cyst. Post cholecystectomy. No intra or extrahepatic biliary ductal dilatation. No ascites. Pancreas: Normal appearance of the pancreas. Spleen: Normal appearance of the spleen. Adrenals/Urinary Tract: Redemonstrated high density nonenhancing filling defects, presumably blood clot, within the right renal collecting system, slightly progressed compared to the 07/12/2022 examination again, the etiology of which is not depicted on this examination. Mild right-sided pelvicaliectasis without asymmetric perinephric stranding. There is preserved symmetric enhancement of the bilateral renal parenchyma. Unchanged approximately 1.8 cm hypoattenuating nonenhancing cyst within the interpolar aspect of the left kidney (coronal image 71, series 5). Additional bilateral subcentimeter hypoattenuating renal lesions are too small to accurately characterize though favored to represent additional renal cysts. There is mild thickening of the bilateral adrenal glands without discrete nodule. Normal appearance of the urinary bladder given underdistention. Stomach/Bowel: Ingested enteric contrast extends to the level of the distal small bowel. No evidence of enteric obstruction. Previously questioned focal outpouching involving degraded curvature of the stomach is again not demonstrated on the present examination. There is persistent elevation/eventration of the left hemidiaphragm. No hiatal hernia. Normal appearance of the terminal ileum and the retrocecal appendix. No pneumoperitoneum, pneumatosis or portal venous gas. Vascular/Lymphatic: Redemonstrated aneurysmal dilatation of the infrarenal abdominal aorta measuring 36 mm in diameter (image 52, series 2). 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 There is a  large amount of slightly irregular mixed calcified and noncalcified atherosclerotic plaque throughout the abdominal aorta, not resulting in hemodynamically significant stenosis. Large amount of irregular mixed calcified and noncalcified atherosclerotic plaque throughout the left common iliac artery, the bilateral external iliac arteries as well as the imaged portions of the bilateral superficial femoral arteries likely resulting in tandem areas of hemodynamically significant narrowing, incompletely evaluated on this non CTA examination. No bulky retroperitoneal, mesenteric, pelvic or inguinal lymphadenopathy. Reproductive: Normal appearance of the pelvic organs for age. No free fluid the pelvic cul-de-sac. Other: Tiny mesenteric fat containing periumbilical hernia. Right-sided gluteal calcifications. Musculoskeletal: No acute or aggressive osseous abnormalities. Moderate to severe multilevel lumbar spine DDD, likely worse at L5-S1 with disc space height loss, endplate irregularity and posteriorly directed disc osteophyte complex at this location. IMPRESSION: 1. Redemonstrated high-density nonenhancing filling defect throughout the right renal  collecting system and renal pelvis, unchanged to slightly progressed compared to the 07/12/2022 examination and while indeterminate is favored to represent blood clot, the etiology of which is again not depicted on the present examination. Further evaluation with ureteroscopy could be performed as indicated. 2. Mild right-sided pelvicaliectasis without asymmetrically delayed renal enhancement. 3. 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 Abdominal Aortic Aneurysm (ICD10-I71.9). Recommend follow-up aortic ultrasound in 2 years. This recommendation follows ACR consensus guidelines: White Paper of the ACR Incidental Findings Committee II on Vascular Findings. J Am Coll Radiol 2013; 10:789-794. 4.  Suspected hemodynamically significant narrowings involving left common iliac artery as well as the bilateral external iliac and imaged portions of the bilateral superficial femoral arteries. Correlation for symptoms of PAD is advised. Aortic Atherosclerosis (ICD10-I70.0). 5. Previously questioned masslike area involving the greater curvature of the stomach is again not demonstrated on the present examination and thus may be artifactual due to associated diaphragmatic hernia/eventration. Electronically Signed   By: Sandi Mariscal M.D.   On: 07/15/2022 16:40   CT ABDOMEN PELVIS W WO CONTRAST  Result Date: 07/12/2022 CLINICAL DATA:  Hematuria, right-sided abdominal pain radiating to the right leg. EXAM: CT ABDOMEN AND PELVIS WITHOUT AND WITH CONTRAST TECHNIQUE: Multidetector CT imaging of the abdomen and pelvis was performed following the standard protocol before and following the bolus administration of intravenous contrast. RADIATION DOSE REDUCTION: This exam was performed according to the departmental dose-optimization program which includes automated exposure control, adjustment of the mA and/or kV according to patient size and/or use of iterative reconstruction technique. CONTRAST:  181m OMNIPAQUE IOHEXOL 300 MG/ML  SOLN COMPARISON:  07/10/2022. FINDINGS: Lower chest: Bibasilar volume loss, right greater than left. Heart size within normal limits. Atherosclerotic calcification of the aorta and coronary arteries. No pericardial or pleural effusion. Distal esophagus is grossly unremarkable. Hepatobiliary: Liver is decreased in attenuation diffusely. Cholecystectomy. No biliary ductal dilatation. Pancreas: Negative. Spleen: Negative. Adrenals/Urinary Tract: Right adrenal gland is unremarkable. Nodular thickening of the medial limb left adrenal gland, unchanged. No specific follow-up necessary. Right renal edema with subtle decreased attenuation of the right renal parenchyma on portal venous phase imaging. High  attenuation filling defect in the upper pole calices (28/46 does not appear to enhance, measuring at least 1.5 x 1.8 cm (10/11). An additional linear filling defect is seen within the right renal pelvis on delayed imaging (10/15). Associated fullness of the right intrarenal collecting system and right renal pelvis. Poor opacification of the visualized portion of the upper right ureter. 2.2 cm nonenhancing cyst in interpolar left kidney (4/30). No follow-up necessary. Additional subcentimeter low-attenuation lesions in the kidneys are too small to characterize. There may be a punctate stone in the upper pole left kidney. No specific follow-up necessary. Ureters are decompressed. Bladder is grossly unremarkable. Stomach/Bowel: There may be mild proximal gastric wall thickening. Previously questioned masslike area along the greater curvature of the stomach is not readily appreciated. Small bowel, appendix and colon are unremarkable. Vascular/Lymphatic: Abdominal aorta measures 3.3 cm at the level of the superior mesenteric artery and 3.6 cm below the level of the renal arteries. Atherosclerotic calcification of the aorta. Right common iliac artery measures 2.5 cm. No pathologically enlarged lymph nodes. Reproductive: Uterus is visualized. No adnexal mass. 2.2 cm left perineal cyst. Other: No free fluid. Mesenteries and peritoneum are unremarkable. Elevated left hemidiaphragm. Musculoskeletal: Degenerative changes in the spine. No worrisome lytic or sclerotic lesions. IMPRESSION: 1. High attenuation caliceal  filling defect in the upper pole right kidney without enhancement, with an additional linear filling defect in the right renal pelvis, findings indicative of hemorrhage/clot. Difficult to definitively exclude malignancy. Associated fullness of the right renal pelvis and right intrarenal collecting system. 2. Possible punctate stone in the upper pole left kidney. 3. Probable proximal gastric wall thickening.  Previously questioned masslike area within the greater curvature of the gastric fundus is not readily appreciated. 4. Hepatic steatosis. 5. 3.6 cm infrarenal aortic aneurysm. Recommend follow-up every 2 years. This recommendation follows ACR consensus guidelines: White Paper of the ACR Incidental Findings Committee II on Vascular Findings. J Am Coll Radiol 2013; 10:789-794. 6. Aortic atherosclerosis (ICD10-I70.0). Coronary artery calcification. Right common iliac artery aneurysm. Electronically Signed   By: Lorin Picket M.D.   On: 07/12/2022 08:38   US PELVIC COMPLETE W TRANSVAGINAL AND TORSION R/O  Result Date: 07/10/2022 CLINICAL DATA:  Pelvic pain and vaginal bleeding EXAM: TRANSABDOMINAL AND TRANSVAGINAL ULTRASOUND OF PELVIS DOPPLER ULTRASOUND OF OVARIES TECHNIQUE: Both transabdominal and transvaginal ultrasound examinations of the pelvis were performed. Transabdominal technique was performed for global imaging of the pelvis including uterus, ovaries, adnexal regions, and pelvic cul-de-sac. It was necessary to proceed with endovaginal exam following the transabdominal exam to visualize the uterus, endometrium, ovaries, and adnexa. Color and duplex Doppler ultrasound was utilized to evaluate blood flow to the ovaries. COMPARISON:  Pelvic ultrasound 06/17/2020 and same day CT abdomen and pelvis FINDINGS: Uterus Measurements: 6.2 x 3.6 x 4.9 cm = volume: 56 mL. Round heterogenous predominantly hypoechoic mass within the anterior uterine fundus measuring 1.8 x 1.4 x 1.7 cm compatible with fibroid. Endometrium Thickness: 1.3 mm.  No focal abnormality visualized. Right ovary Not visualized Left ovary Not visualized Pulsed Doppler evaluation of both ovaries demonstrates normal low-resistance arterial and venous waveforms. Other findings No abnormal free fluid. IMPRESSION: 1. No acute sonographic abnormality in the pelvis. 2. Fibroid within the anterior uterine fundus with some mild distortion of the endometrial  canal. 3. No focal endometrial abnormality or significant endometrial thickening. 4. Nonvisualization of the ovaries. Electronically Signed   By: Placido Sou M.D.   On: 07/10/2022 20:01   CT Renal Stone Study  Result Date: 07/10/2022 CLINICAL DATA:  Lower abdominal pain with hematuria x3 days. EXAM: CT ABDOMEN AND PELVIS WITHOUT CONTRAST TECHNIQUE: Multidetector CT imaging of the abdomen and pelvis was performed following the standard protocol without IV contrast. RADIATION DOSE REDUCTION: This exam was performed according to the departmental dose-optimization program which includes automated exposure control, adjustment of the mA and/or kV according to patient size and/or use of iterative reconstruction technique. COMPARISON:  CT December 10, 2017. FINDINGS: Lower chest: No acute abnormality. Hepatobiliary: Unremarkable noncontrast enhanced appearance of the hepatic parenchyma. Gallbladder surgically absent. No biliary ductal dilation. Pancreas: No pancreatic ductal dilation or evidence of acute inflammation. Spleen: No splenomegaly. Adrenals/Urinary Tract: Bilateral adrenal glands are within normal limits. No hydronephrosis. Nonobstructive punctate left upper/interpolar renal stone. Hypodense 2.1 cm left renal lesion measures fluid density consistent with a cyst and considered benign requiring no independent imaging follow-up. Subcentimeter hypodense right renal lesion measures 9 mm on image 30/2, technically too small to accurately characterize but statistically likely to reflect a cyst. No renal, ureteral bladder calculi. Gas in the urinary bladder recommend correlation with history of recent instrumentation. Stomach/Bowel: No radiopaque enteric contrast material was administered. Masslike lobular area along the greater curvature of the gastric fundus best seen on coronal image 80/4 measuring 2.9 x 3.9 x 3.7  cm on images 18/2 and 80/4. No pathologic dilation of small or large bowel. Appendix and terminal  ileum appear normal. Colonic diverticulosis without findings of acute diverticulitis. Vascular/Lymphatic: Aneurysmal dilation of the infrarenal abdominal aorta measures 3.4 cm on image 47/2 previously measuring 3.1 cm on CT December 10, 2017. 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 No pathologically enlarged abdominal or pelvic lymph nodes. Reproductive: Uterus and bilateral adnexa are unremarkable. Other: No significant abdominopelvic free fluid. Musculoskeletal: No acute osseous abnormality. Multilevel degenerative changes spine. IMPRESSION: 1. Nonobstructive punctate left upper/interpolar renal stone. No obstructive ureteral or bladder calculi. 2. Masslike 3.9 cm lobular exophytic area along the greater curvature of the gastric fundus is nonspecific possibly reflecting a focal outpouching and fold thickening but is suspicious for a discrete gastric mass such as a gastrointestinal stromal tumor. Suggest gastroenterology consult and further evaluation with nonemergent CT with oral and IV contrast versus upper endoscopy. 3. Colonic diverticulosis without findings of acute diverticulitis. 4. Increased size of the infrarenal abdominal aortic aneurysm now measuring 3.4 cm Recommend follow-up ultrasound every 3 years. This recommendation follows ACR consensus guidelines: White Paper of the ACR Incidental Findings Committee II on Vascular Findings. J Am Coll Radiol 2013; 10:789-794. 5. 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 6.  Aortic Atherosclerosis (ICD10-I70.0). Electronically Signed   By: Dahlia Bailiff M.D.   On: 07/10/2022 14:14    Microbiology: Results for orders placed or performed during the hospital encounter of 07/18/22  Urine Culture     Status: Abnormal   Collection Time: 07/18/22  9:31 PM   Specimen: Urine, Clean Catch  Result Value Ref Range Status   Specimen Description   Final    URINE,  CLEAN CATCH Performed at [facility], [place] [address] [place], [place] [address]    Special Requests   Final    NONE Performed at [facility], [place] [address] [place],  [address]    Culture 20,000 COLONIES/mL ENTEROCOCCUS AVIUM (A)  Final   Report Status 07/22/2022 FINAL  Final   Organism ID, Bacteria ENTEROCOCCUS AVIUM (A)  Final      Susceptibility   Enterococcus avium - MIC*    AMPICILLIN 16 RESISTANT Resistant     NITROFURANTOIN 32 SENSITIVE Sensitive     VANCOMYCIN <=0.5 SENSITIVE Sensitive     * 20,000 COLONIES/mL ENTEROCOCCUS AVIUM   Labs: CBC: Recent Labs  Lab 07/17/22 0423 07/18/22 1728 07/19/22 0448 07/20/22 0454 07/21/22 0850  WBC 13.1* 14.8* 14.1* 12.2* 11.8*  NEUTROABS 8.8* 11.6* 9.9* 7.8* 7.7  HGB 8.9* 9.1* 8.0* 8.1* 8.0*  HCT 28.7* 29.2* 25.7* 26.3* 25.7*  MCV 86.7 86.1 86.5 86.8 86.8  PLT 233 223 206 228 546   Basic Metabolic Panel: Recent Labs  Lab 07/18/22 1728 07/19/22 0448 07/20/22 0454 07/21/22 0850  NA 138 141 145 143  K 5.2* 4.1 3.8 3.3*  CL 109 113* 115* 111  CO2 '24 24 23 23  '$ GLUCOSE 129* 96 92 102*  BUN 50* 42* 25* 15  CREATININE 2.35* 1.71* 1.10* 0.92  CALCIUM 8.7* 8.1* 8.2* 7.8*  MG  --   --  2.1 1.6*  PHOS  --   --  2.2* 2.8   Liver Function Tests: Recent Labs  Lab 07/18/22 1728 07/19/22 0448 07/20/22 0454 07/21/22 0850  AST 16 14* 14* 14*  ALT '16 14 15 16  '$ ALKPHOS 61 55 53  50  BILITOT 0.8 0.8 0.7 0.7  PROT 7.0 6.3* 6.2* 6.0*  ALBUMIN 3.1* 2.7* 2.6* 2.7*   CBG: No results for input(s): "GLUCAP" in the last 168 hours.  Discharge time spent: greater than 30 minutes.  Signed: Raiford Noble, DO [facility] 07/22/2022

## 2022-07-22 NOTE — TOC Progression Note (Addendum)
Transition of Care [facility] - Progression Note    Patient Details  Name: Kristi Meyers MRN: [id number] Date of Birth: 07-Nov-1955  Transition [facility]) CM/SW Contact  Leeroy Cha, RN Phone Number: 07/22/2022, 8:04 AM  Clinical Narrative:    Message sent to Harriet Pho at [phone] patient is ready to come when there is a bed ready. Tcf-Kitty at [facility] will not have a bed until Monday .  No admissions during the weekend. Tct-Janie Holdern at [facility] will check bed status and call back. Tcf-wendy at [facility]-bed is available. Tct-dtg -message left that pt will be going to [facility] .no beds available at [facility] until next week. Spoke with the patient wants me to call the dtg. 1035-message left for dtg on phone to call wendy at [facility] [phone] given. 1126/tct-tiffany beard the daughter-informed that patient would be going to [facility] today and to please go ahead and call wendy  at the above number to get paperwork done. Wendy at [facility] and md made aware of this update. Daughter is to be at [facility] at 1330 tofday to sign papers and then patient can gfo via ptar. Auth called to [facility].  Papers signed ptar called at [phone]. Expected Discharge Plan: [place] Barriers to Discharge: Continued Medical Work up  Expected Discharge Plan and Services Expected Discharge Plan: [place] In-house Referral: NA Discharge Planning Services: NA Post Acute Care Choice: [place] Living arrangements for the past 2 months: Apartment Expected Discharge Date: 07/21/22               DME Arranged: N/A DME Agency: NA       HH Arranged: NA HH Agency: NA         Social Determinants of Health (SDOH) Interventions    Readmission Risk Interventions   No data to display

## 2022-07-22 NOTE — Progress Notes (Signed)
Patient alert and oriented. No distress noted. Patient discharged. PETAR here to transport, daughter at bedside. Patient left without incident.

## 2022-08-21 ENCOUNTER — Telehealth: Payer: Self-pay | Admitting: Family Medicine

## 2022-08-21 NOTE — Telephone Encounter (Signed)
Ok

## 2022-08-21 NOTE — Telephone Encounter (Signed)
Pam rn [facility] is calling and need VO for SN 1x1, 2x1, 1x2, 2 month x1 and prn x 2 for acute kidney failure

## 2022-08-23 NOTE — Telephone Encounter (Signed)
ATC Pam, no answer. Left message on confidential VM.

## 2022-08-24 ENCOUNTER — Encounter: Payer: Self-pay | Admitting: Family Medicine

## 2022-08-24 ENCOUNTER — Ambulatory Visit (INDEPENDENT_AMBULATORY_CARE_PROVIDER_SITE_OTHER): Payer: Medicare HMO | Admitting: Family Medicine

## 2022-08-24 VITALS — BP 120/84 | HR 74 | Temp 98.6°F

## 2022-08-24 DIAGNOSIS — N179 Acute kidney failure, unspecified: Secondary | ICD-10-CM

## 2022-08-24 DIAGNOSIS — I10 Essential (primary) hypertension: Secondary | ICD-10-CM

## 2022-08-24 DIAGNOSIS — E876 Hypokalemia: Secondary | ICD-10-CM

## 2022-08-24 DIAGNOSIS — D62 Acute posthemorrhagic anemia: Secondary | ICD-10-CM

## 2022-08-24 DIAGNOSIS — I700 Atherosclerosis of aorta: Secondary | ICD-10-CM

## 2022-08-24 DIAGNOSIS — Z86711 Personal history of pulmonary embolism: Secondary | ICD-10-CM | POA: Diagnosis not present

## 2022-08-24 DIAGNOSIS — Z7901 Long term (current) use of anticoagulants: Secondary | ICD-10-CM | POA: Diagnosis not present

## 2022-08-24 LAB — COMPREHENSIVE METABOLIC PANEL
ALT: 9 U/L (ref 0–35)
AST: 13 U/L (ref 0–37)
Albumin: 3.8 g/dL (ref 3.5–5.2)
Alkaline Phosphatase: 72 U/L (ref 39–117)
BUN: 12 mg/dL (ref 6–23)
CO2: 30 mEq/L (ref 19–32)
Calcium: 9.5 mg/dL (ref 8.4–10.5)
Chloride: 108 mEq/L (ref 96–112)
Creatinine, Ser: 0.97 mg/dL (ref 0.40–1.20)
GFR: 59.94 mL/min — ABNORMAL LOW (ref >60.00)
Glucose, Bld: 111 mg/dL — ABNORMAL HIGH (ref 70–99)
Potassium: 4 mEq/L (ref 3.5–5.1)
Sodium: 144 mEq/L (ref 135–145)
Total Bilirubin: 0.5 mg/dL (ref 0.2–1.2)
Total Protein: 7.2 g/dL (ref 6.0–8.3)

## 2022-08-24 LAB — CBC WITH DIFFERENTIAL/PLATELET
Basophils Absolute: 0.1 10*3/uL (ref 0.0–0.1)
Basophils Relative: 1.2 % (ref 0.0–3.0)
Eosinophils Absolute: 0.2 10*3/uL (ref 0.0–0.7)
Eosinophils Relative: 2.3 % (ref 0.0–5.0)
HCT: 31.5 % — ABNORMAL LOW (ref 36.0–46.0)
Hemoglobin: 10.1 g/dL — ABNORMAL LOW (ref 12.0–15.0)
Lymphocytes Relative: 27.3 % (ref 12.0–46.0)
Lymphs Abs: 2.6 10*3/uL (ref 0.7–4.0)
MCHC: 32 g/dL (ref 30.0–36.0)
MCV: 82.4 fl (ref 78.0–100.0)
Monocytes Absolute: 0.8 10*3/uL (ref 0.1–1.0)
Monocytes Relative: 8.5 % (ref 3.0–12.0)
Neutro Abs: 5.7 10*3/uL (ref 1.4–7.7)
Neutrophils Relative %: 60.7 % (ref 43.0–77.0)
Platelets: 302 10*3/uL (ref 150.0–400.0)
RBC: 3.82 Mil/uL — ABNORMAL LOW (ref 3.87–5.11)
RDW: 18 % — ABNORMAL HIGH (ref 11.5–15.5)
WBC: 9.4 10*3/uL (ref 4.0–10.5)

## 2022-08-24 LAB — PHOSPHORUS: Phosphorus: 3.2 mg/dL (ref 2.3–4.6)

## 2022-08-24 LAB — MAGNESIUM: Magnesium: 1.9 mg/dL (ref 1.5–2.5)

## 2022-08-24 NOTE — Progress Notes (Signed)
Subjective:    Patient ID: Kristi Meyers, female    DOB: 04/05/1956, 66 y.o.   MRN: [id number]  Chief Complaint  Patient presents with   GI Problem    Started 2 months, was bleeding, went to hospital, was sent home. Then had a fall, and was back in hospital. Following up from both hospital visits.  Now only has pain when she has to go to bathroom.     HPI Patient was seen today for HFU.  Patient hospitalized 9/25-10/3/23 and again 10/3-10/7/23 for acute blood loss anemia.  Patient initially discharged from hospital but and fell outside the front door as her legs get weak.  "HPI per Dr. 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  Patient was discharged today around 1 PM.  Patient had been hospitalized from 9/25 through 07/18/2022 due to gross hematuria and acute blood loss anemia.   When she got home with her daughter, patient was trying to get into her house.  She fell outside the front door of her house.  Daughter Kristi Meyers) states that patient got weak in her legs and could not support her weight.  She did not hit her head.  Patient brought back to the ER at 4:20 PM.   On arrival temp 97.6 heart rate 64 blood pressure 130/80 satting 95% on room air.   Labs show sodium 138, potassium 5.2, BUN of 50, creatinine 2.35   Last chemistry that was done in the hospital on 07/11/2022 showed a BUN of 21, creatinine 0.95.   CBC showed a white count of 14.8, hemoglobin 9.1, platelets 223   Left ankle x-ray negative for fracture.   Due to AKI and patient inability to walk, [facility] contacted for admission.    Interim History  Renal function is improving and hematuria is clearing.  Case was discussed with urology who recommends no acute surgical intervention warranted at this time and recommending a CTA/embolization only  if necessary if she starts actively hemorrhaging or becomes profoundly hemodynamically unstable and they are planning for outpatient cystoscopy in the coming weeks.  She is improved and her hematuria is improving and after discussion with urology it is okay to resume her anticoagulation with Coumadin we will place her back on 4 mg per cardiology direction.  She will follow-up with Dr. Terrence Meyers in the outpatient setting and have a repeat INR check on Monday or Tuesday of the coming week.  She is medically stable for discharge   ADDENDUM 07/22/2022: patient did not unfortunately yesterday given that she actually had no bed availability at her [facility].  She is now out of bed at [facility] and remains medically stable for discharge.  She denies any other concerns or complaints   Assessment and Plan: AKI (acute kidney injury) ([place]) -Admit to med/surg bed. Continue with IVF. Repeat BMP. Likely due to poor po intake. -Patient is BUN/creatinine has gone from 50/2.35 is now 42/1.71 and BUN/creatinine is now 25/1.10 yesterday and today is now 15/0.90 and improving -Avoid further nephrotoxic medications, contrast dyes, hypotension and dehydration to ensure adequate renal perfusion and will need to renally dose medications -Continue IV fluid hydration with normal saline at 75 MLS per and will stop today -Continue to monitor and trend renal function carefully repeat CMP in the a.m. -We will repeat CTA of the abdomen pelvis if  renal function improves further and will need to discuss with interventional radiology   Impaired ambulation -Does not appear that pt walked with PT during her 7 day hospital stay from 9-25 to 10-3. Will need PT consult. TOC consult for SNF placement. Dtr(tiffany) says pt was already using walker(in the house) and cane(outside the house) for assistance. Mostly for balance. -PT OT recommending SNF now I have ordered her a left ankle brace given that the patient has pain left  lateral ankle and she stated that she rolled it inward during her fall prior to admission   Long term (current) use of anticoagulants - 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 Could not afford Eliquis. -Remains on coumadin. Could not afford Eliquis. Pharmacy consult for inr management and will hold Coumadin given that she still have some hematuria and will continue monitor closely as this is improving   Hyperlipidemia, unspecified -Continue with lipitor 40 mg qhs.   Gross hematuria, improving  -Holding her anticoagulation -Discussed with urology who recommends IR consultation for possible embolectomy versus evaluation for possible repeat CT a scan to localize bleeding; given that she is improving we will hold off and only time we will get a CTA as if she worsens or is becomes hemodynamically unstable -Per urology no need for Foley catheter at this time and monitor closely -Gross hematuria is improving and urology recommends no acute urological surgical intervention at this time and recommending following up with [facility] in outpatient setting for cystoscopy to complete the gross hematuria work-up and the patient has significant gross hematuria with concern for acute bleed they may consider CTA to localize the bleed versus an IR consultation for possible embolization -Okay to resume anticoagulation per urology and follow-up in outpatient setting for cystoscopy   Recent E. coli UTI -Discussed with urology who recommends extending antibiotic course and we have initiated her back on IV ceftriaxone -Repeat urinalysis and urine culture pending and urinalysis could not be tested given blood in the urine -WBC went from 14.8 and is now trending down to 11.8 on last check -Repeat urine cultures pending we will change to p.o. cefadroxil and will need outpatient follow-up for further monitoring and continuing antibiotics for UTI for 14 days total treatment however I spoke with Dr. Gale Meyers of ID and he  feels that she has been adequately treated and does not need any further treatment  -Repeat urine culture shows 20,000 colony-forming units of Enterococcus avium but after ID waiting they feel that this is significant and does not need to be treated   Hypokalemia -Patient's potassium is now 3.3 -Replete with p.o. KCl 40 mEq twice daily x2 doses -Continue monitor and trend and repeat CMP in a.m.   Hypomagnesemia -Patient's mag level is now 1.6 -Replete with IV mag sulfate 2 g -Continue monitor and replete as necessary -Repeat mag level within 1 week   Essential hypertension Stable. Pt was not receiving losartan during her hospital stay 9-25 to 10-3.  Likely her AKI due to poor po liquid intake and we will continue to hold naproxen medications -Continue monitor blood pressures per protocol -Last blood pressure reading was 147/94   ABLA (acute blood loss anemia) -S/p EGD on 07-17-2022 that was negative as source of acute blood loss anemia that pt was initially admitted for 07-10-2022. -CT scan done last admission and showed "Redemonstrated high-density nonenhancing filling defect throughout the right renal collecting system and renal pelvis, unchanged to slightly progressed compared to the 07/12/2022 examination and while  indeterminate is favored to represent blood clot, the etiology of which is again not depicted on the present examination. Further evaluation with ureteroscopy could be performed as indicated. Mild right-sided pelvicaliectasis without asymmetrically delayed renal enhancement. 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 Abdominal Aortic Aneurysm.  Recommend follow-up aortic ultrasound in 2 years. Suspected hemodynamically significant narrowings involving left common iliac artery as well as the bilateral external iliac and imaged portions of the bilateral superficial femoral arteries. Correlation for  symptoms of PAD is advised. Aortic Atherosclerosis. 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 Lorriane Shire next week   Hypoalbuminemia -Patient's albumin level of 3.1 -> 2.7 -> 2.6 yesterday and today is now 3.7 -Continue to monitor trend and repeat CMP in a.m.   Constipation -Initiate bowel regimen placed on MiraLAX 17 g p.o. twice daily as well as senna docusate 1 tab p.o. twice daily and also have a bisacodyl 10 mg suppository if necessary   Obesity -Complicates overall prognosis and care -Estimated body mass index is 33.49 kg/m as calculated from the following:   Height as of this encounter: '5\' 4"'$  (1.626 m).   Weight as of this encounter: 88.5 kg.  -Weight Loss and Dietary Counseling given"  Patient states she is doing better since returning home from hospital.  States appetite has increased.  Denies constipation, dizziness, hematuria.  Patient thinks she has an upcoming urology appointment.  States taking nitroglycerin sublingual just about every night.  Inquires about refill.  States had recent cardiology appointment  Past Medical History:  Diagnosis Date   Abnormal EKG    hx of left bundle branch block    Acute saddle pulmonary embolus (HCC) 12/10/2017   Anginal pain (HCC)    stable   GERD (gastroesophageal reflux disease)    Heart murmur    soft systolic per dr Kristi Meyers [id number] lov   High cholesterol     Hypertension    Left leg DVT ([place]) 11/2017   pulmonary embolus   Migraine    PMB (postmenopausal bleeding)    Vertigo     No Known Allergies  ROS General: Denies fever, chills, night sweats, changes in weight, changes in appetite HEENT: Denies headaches, ear pain, changes in vision, rhinorrhea, sore throat CV: Denies CP, palpitations, SOB, orthopnea Pulm: Denies SOB, cough, wheezing GI: Denies abdominal pain, nausea, vomiting, diarrhea, constipation GU: Denies dysuria, hematuria, frequency, vaginal discharge Msk: Denies muscle cramps, joint pains Neuro: Denies weakness, numbness, tingling Skin: Denies rashes, bruising Psych: Denies depression, anxiety, hallucinations     Objective:    Blood pressure 120/84, pulse 74, temperature 98.6 F (37 C), temperature source Oral, last menstrual period 11/17/2008, SpO2 96 %.  Gen. Pleasant, well-nourished, in no distress, normal affect   HEENT: [place]/AT, face symmetric, conjunctiva clear, no scleral icterus, PERRLA, EOMI, nares patent without drainage Lungs: no accessory muscle use, CTAB, no wheezes or rales Cardiovascular: RRR, no m/r/g, no peripheral  edema Neuro:  A&Ox3, CN II-XII intact, gait not assessed as sitting in transport wheelchair Skin:  Warm, no lesions/ rash   Wt Readings from Last 3 Encounters:  07/22/22 195 lb 1.7 oz (88.5 kg)  07/18/22 188 lb 11.4 oz (85.6 kg)  09/20/20 194 lb (88 kg)    Lab Results  Component Value Date   WBC 11.8 (H) 07/21/2022   HGB 8.0 (L) 07/21/2022   HCT 25.7 (L) 07/21/2022   PLT 276 07/21/2022   GLUCOSE 102 (H) 07/21/2022   ALT 16 07/21/2022   AST 14 (L) 07/21/2022   NA 143 07/21/2022   K 3.3 (L) 07/21/2022   CL 111 07/21/2022   CREATININE 0.92 07/21/2022   BUN 15 07/21/2022   CO2 23 07/21/2022   TSH 1.187 12/09/2017   INR 1.7 (H) 07/22/2022    Assessment/Plan:  AKI (acute kidney injury) ([place]) -Resolving -Creatinine 2.35 on 07/18/2022=> 1.71 on 07/19/2022=> 1.10 on 04/19/2022, and  0.92 on 04/20/2022  - Plan: Magnesium, Phosphorus, CMP  History of pulmonary embolus (PE) - Had saddle PE in 2019 requiring EKOS  -Continue Coumadin 2/2 difficulty affording Eliquis - Plan: CBC with Differential/Platelet  Chronic anticoagulation -Continue Coumadin 4 mg daily  Essential hypertension -Controlled -Continue current medications - Plan: CMP  Atherosclerosis of aorta (HCC) -Continue Lipitor 40 mg daily -Continue lifestyle modifications  ABLA (acute blood loss anemia) -Gross hematuria resolved -Hemoglobin/hematocrit 8.0/25.7 on 07/21/2022 -We will recheck this visit -Patient encouraged to keep appointment with urology -Given strict precautions  - Plan: CBC with Differential/Platelet  Hypocalcemia -Calcium 7.8 on 07/21/2022 -Plan: CMP, PTH  Hypokalemia -Potassium 3.3 with 07/21/2022 -Recheck -Plan: Mag, CMP  F/u as needed  Grier Mitts, MD

## 2022-08-24 NOTE — Patient Instructions (Addendum)
I do not see any recent visits from cardiology listed.  It is important that your cardiologist know you are taking nitroglycerin every night.  You should contact their office so that an appointment can be made for you to follow-up with them.  It also looks like you have refills on the med.  Reach out to your pharmacy to check on this.  Consider drinking boost or Ensure shakes to help improve your protein.

## 2022-08-25 LAB — EXTRA SPECIMEN

## 2022-08-25 LAB — PARATHYROID HORMONE, INTACT (NO CA): PTH: 87 pg/mL — ABNORMAL HIGH (ref 16–77)

## 2022-09-18 ENCOUNTER — Telehealth: Payer: Self-pay | Admitting: Family Medicine

## 2022-09-18 NOTE — Telephone Encounter (Signed)
Left message for patient to call back and schedule Medicare Annual Wellness Visit (AWV) either virtually or in office. Left  my Herbie Drape number [phone]    AWV-I PER PAL[place] 04/15/20 please schedule with Nurse Health Adviser   45 min for awv-i and in office appointments 30 min for awv-s  phone/virtual appointments

## 2022-09-19 NOTE — Telephone Encounter (Signed)
Returned patients call  Patient returned my call 09/18/22

## 2022-10-24 ENCOUNTER — Ambulatory Visit: Payer: Medicare HMO | Admitting: Podiatry

## 2022-11-01 ENCOUNTER — Ambulatory Visit: Payer: Medicare HMO | Admitting: Podiatry

## 2022-11-08 ENCOUNTER — Ambulatory Visit (INDEPENDENT_AMBULATORY_CARE_PROVIDER_SITE_OTHER): Payer: Medicare HMO | Admitting: Podiatry

## 2022-11-08 DIAGNOSIS — R601 Generalized edema: Secondary | ICD-10-CM

## 2022-11-08 NOTE — Progress Notes (Signed)
Subjective:  Patient ID: Kristi Meyers, female    DOB: 02/24/1956,  MRN: [id number]  Chief Complaint  Patient presents with   Foot Pain    Pt stated that her ankle swells up     67 y.o. female presents with the above complaint.  Patient presents with complaint left generalized ankle swelling.  Patient states that it is causing her some discomfort.  She does want to get it evaluated as most of the left ankle.  She has not seen and was prior to seeing me.  She does not wear any compression socks.  Chemosis was up in a dependent position but he gets better during the next morning while she has been laying on her bed.  She wants to make sure that there is no acute complaints.   Review of Systems: Negative except as noted in the HPI. Denies N/V/F/Ch.  Past Medical History:  Diagnosis Date   Abnormal EKG    hx of left bundle branch block    Acute saddle pulmonary embolus (HCC) 12/10/2017   Anginal pain (HCC)    stable   GERD (gastroesophageal reflux disease)    Heart murmur    soft systolic per dr Terrence Dupont [phone] lov   High cholesterol    Hypertension    Left leg DVT ([place]) 11/2017   pulmonary embolus   Migraine    PMB (postmenopausal bleeding)    Vertigo     Current Outpatient Medications:    acetaminophen (TYLENOL) 500 MG tablet, Take 500 mg by mouth every 6 (six) hours as needed., Disp: , Rfl:    amLODipine (NORVASC) 5 MG tablet, Take 5 mg by mouth daily., Disp: , Rfl:    atorvastatin (LIPITOR) 40 MG tablet, Take 40 mg by mouth at bedtime. , Disp: , Rfl:    carvedilol (COREG) 25 MG tablet, Take 25 mg by mouth 2 (two) times daily with a meal., Disp: , Rfl:    iron polysaccharides (NIFEREX) 150 MG capsule, Take 1 capsule (150 mg total) by mouth daily., Disp: 30 capsule, Rfl: 0   meclizine (ANTIVERT) 12.5 MG tablet, Take 12.5 mg by mouth 2 (two) times daily., Disp: , Rfl:    nitroGLYCERIN (NITROSTAT) 0.4 MG SL tablet, Place under the tongue., Disp: , Rfl:    ondansetron  (ZOFRAN) 4 MG tablet, Take 1 tablet (4 mg total) by mouth every 6 (six) hours as needed for nausea., Disp: 20 tablet, Rfl: 0   pantoprazole (PROTONIX) 40 MG tablet, Take 1 tablet (40 mg total) by mouth daily., Disp: 30 tablet, Rfl: 0   polyethylene glycol (MIRALAX / GLYCOLAX) 17 g packet, Take 17 g by mouth 2 (two) times daily., Disp: 14 each, Rfl: 0   senna-docusate (SENOKOT-S) 8.6-50 MG tablet, Take 1 tablet by mouth at bedtime., Disp: , Rfl:    warfarin (COUMADIN) 4 MG tablet, Take 1 tablet (4 mg total) by mouth daily at 4 PM. 4 mg daily and follow up with Dr. Terrence Dupont within 1 week, Disp: 30 tablet, Rfl: 0  Social History   Tobacco Use  Smoking Status Former   Packs/day: 0.25   Years: 20.00   Total pack years: 5.00   Types: Cigarettes   Quit date: 10/16/2017   Years since quitting: 5.1  Smokeless Tobacco Never    No Known Allergies Objective:  There were no vitals filed for this visit. There is no height or weight on file to calculate BMI. Constitutional Well developed. Well nourished.  Vascular Dorsalis pedis pulses palpable  bilaterally. Posterior tibial pulses palpable bilaterally. Capillary refill normal to all digits.  No cyanosis or clubbing noted. Pedal hair growth normal.  Neurologic Normal speech. Oriented to person, place, and time. Epicritic sensation to light touch grossly present bilaterally.  Dermatologic Nails well groomed and normal in appearance. No open wounds. No skin lesions.  Orthopedic: No localized pain noted around the ankle.  No pain with range of motion of the ankle joint no deep intra-articular ankle pain noted.  Generalized swelling noted circumferentially around the ankle nonpitting edema.   Radiographs: None Assessment:   1. Generalized edema    Plan:  Patient was evaluated and treated and all questions answered.  Left ankle generalized edema noted -All questions and concerns were discussed with the patient in extensive detail. -I  encouraged her to do importance of RICE protocol with elevation as well as compression anklet were dispensed.  This will help with the swelling.  She states understanding and will wear that.  No follow-ups on file.   Left ankle swelling generalized compression anklet

## 2022-11-13 ENCOUNTER — Telehealth: Payer: Self-pay | Admitting: Family Medicine

## 2022-11-13 NOTE — Telephone Encounter (Signed)
Tried calling patient to schedule Medicare Annual Wellness Visit (AWV) either virtually or in office.  my jabber number [phone]   No answer  AWV-I PER PALMETTO 04/15/20 please schedule with Nurse Health Adviser   45 min for awv-i  in office appointments 30 min for awv-s & awv-i phone/virtual appointments

## 2022-11-28 ENCOUNTER — Ambulatory Visit (INDEPENDENT_AMBULATORY_CARE_PROVIDER_SITE_OTHER): Payer: Medicare HMO | Admitting: Orthopedic Surgery

## 2022-11-28 ENCOUNTER — Encounter: Payer: Self-pay | Admitting: Orthopedic Surgery

## 2022-11-28 DIAGNOSIS — M1711 Unilateral primary osteoarthritis, right knee: Secondary | ICD-10-CM

## 2022-11-28 MED ORDER — METHYLPREDNISOLONE ACETATE 40 MG/ML IJ SUSP
40.0000 mg | INTRAMUSCULAR | Status: AC | PRN
  Administered 2022-11-28: 40 mg via INTRA_ARTICULAR

## 2022-11-28 MED ORDER — LIDOCAINE HCL (PF) 1 % IJ SOLN
5.0000 mL | INTRAMUSCULAR | Status: AC | PRN
  Administered 2022-11-28: 5 mL

## 2022-11-28 NOTE — Progress Notes (Signed)
Office Visit Note   Patient: Kristi Meyers           Date of Birth: 04-10-1956           MRN: [id number] Visit Date: 11/28/2022              Requested by: Billie Ruddy, MD [place],  [place] [address] PCP: Billie Ruddy, MD  Chief Complaint  Patient presents with   Right Knee - Follow-up      HPI: Patient is a 67 year old woman with osteoarthritis of the right knee.  Patient states her last injection in April provided her good relief.  Assessment & Plan: Visit Diagnoses:  1. Primary osteoarthritis of right knee     Plan: Patient underwent repeat injection in the right knee.  She will follow-up as needed.  Follow-Up Instructions: Return if symptoms worsen or fail to improve.   Ortho Exam  Patient is alert, oriented, no adenopathy, well-dressed, normal affect, normal respiratory effort. Examination patient is a mild effusion of the right knee there is crepitation with range of motion collaterals and cruciates are stable.  Imaging: No results found. No images are attached to the encounter.  Labs: Lab Results  Component Value Date   REPTSTATUS 07/22/2022 FINAL 07/18/2022   CULT 20,000 COLONIES/mL ENTEROCOCCUS AVIUM (A) 07/18/2022   LABORGA ENTEROCOCCUS AVIUM (A) 07/18/2022     Lab Results  Component Value Date   ALBUMIN 3.8 08/24/2022   ALBUMIN 2.7 (L) 07/21/2022   ALBUMIN 2.6 (L) 07/20/2022    Lab Results  Component Value Date   MG 1.9 08/24/2022   MG 1.6 (L) 07/21/2022   MG 2.1 07/20/2022   No results found for: "VD25OH"  No results found for: "PREALBUMIN"    Latest Ref Rng & Units 08/24/2022    1:52 PM 07/21/2022    8:50 AM 07/20/2022    4:54 AM  CBC EXTENDED  WBC 4.0 - 10.5 K/uL 9.4  11.8  12.2   RBC 3.87 - 5.11 Mil/uL 3.82  2.96  3.03    2.98   Hemoglobin 12.0 - 15.0 g/dL 10.1  8.0  8.1   HCT 36.0 - 46.0 % 31.5  25.7  26.3   Platelets 150.0 - 400.0 K/uL 302.0  276  228   NEUT# 1.4 - 7.7 K/uL 5.7  7.7  7.8   Lymph#  0.7 - 4.0 K/uL 2.6  2.7  2.6      There is no height or weight on file to calculate BMI.  Orders:  No orders of the defined types were placed in this encounter.  No orders of the defined types were placed in this encounter.    Procedures: Large Joint Inj: R knee on 11/28/2022 4:35 PM Indications: pain and diagnostic evaluation Details: 22 G 1.5 in needle, anteromedial approach  Arthrogram: No  Medications: 5 mL lidocaine (PF) 1 %; 40 mg methylPREDNISolone acetate 40 MG/ML Outcome: tolerated well, no immediate complications Procedure, treatment alternatives, risks and benefits explained, specific risks discussed. Consent was given by the patient. Immediately prior to procedure a time out was called to verify the correct patient, procedure, equipment, support staff and site/side marked as required. Patient was prepped and draped in the usual sterile fashion.      Clinical Data: No additional findings.  ROS:  All other systems negative, except as noted in the HPI. Review of Systems  Objective: Vital Signs: LMP 11/17/2008 (Approximate)   Specialty Comments:  No specialty comments  available.  PMFS History: Patient Active Problem List   Diagnosis Date Noted   Impaired ambulation 07/18/2022   ABLA (acute blood loss anemia)    Abnormal CT scan, stomach    Epigastric pain    Long term (current) use of anticoagulants - 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 Could not afford Eliquis. 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   Postmenopausal bleeding 07/01/2020   Fibroid 07/01/2020   History of pulmonary embolus (PE) - Had saddle PE in 2019 requiring EKOS 07/01/2020   Primary localized osteoarthritis of right knee 03/14/2019   Abdominal aortic aneurysm (AAA) 3.0 cm to 5.0 cm in diameter in female [facility]) 02/18/2018   AKI (acute kidney injury) ([place]) 12/15/2017   Past  Medical History:  Diagnosis Date   Abnormal EKG    hx of left bundle branch block    Acute saddle pulmonary embolus (HCC) 12/10/2017   Anginal pain (HCC)    stable   GERD (gastroesophageal reflux disease)    Heart murmur    soft systolic per dr Terrence Dupont [id number] lov   High cholesterol    Hypertension    Left leg DVT ([place]) 11/2017   pulmonary embolus   Migraine    PMB (postmenopausal bleeding)    Vertigo     History reviewed. No pertinent family history.  Past Surgical History:  Procedure Laterality Date   BIOPSY  07/17/2022   Procedure: BIOPSY;  Surgeon: Rush Landmark Telford Nab., MD;  Location: [facility];  Service: Gastroenterology;;   CHOLECYSTECTOMY  2011   [place] N/A 09/07/2020   Procedure: DILATATION & CURETTAGE/HYSTEROSCOPY WITH MYOSURE;  Surgeon: Salvadore Dom, MD;  Location: [facility];  Service: Gynecology;  Laterality: N/A;   ESOPHAGOGASTRODUODENOSCOPY Left 07/17/2022   Procedure: ESOPHAGOGASTRODUODENOSCOPY (EGD);  Surgeon: Irving Copas., MD;  Location: [facility];  Service: Gastroenterology;  Laterality: Left;   IR ANGIOGRAM PULMONARY BILATERAL SELECTIVE  12/10/2017   IR ANGIOGRAM SELECTIVE EACH ADDITIONAL VESSEL  12/10/2017   IR ANGIOGRAM SELECTIVE EACH ADDITIONAL VESSEL  12/10/2017   IR INFUSION THROMBOL ARTERIAL INITIAL (MS)  12/10/2017   IR INFUSION THROMBOL ARTERIAL INITIAL (MS)  12/10/2017   IR THROMB F/U EVAL ART/VEN FINAL DAY (MS)  12/11/2017   IR US GUIDE VASC ACCESS RIGHT  12/10/2017   UPPER ESOPHAGEAL ENDOSCOPIC ULTRASOUND (EUS) N/A 07/17/2022   Procedure: UPPER ESOPHAGEAL ENDOSCOPIC ULTRASOUND (EUS);  Surgeon: Irving Copas., MD;  Location: [facility];  Service: Gastroenterology;  Laterality: N/A;   Social History   Occupational History   Not on file  Tobacco Use   Smoking status: Former    Packs/day: 0.25    Years: 20.00    Total pack years: 5.00    Types: Cigarettes     Quit date: 10/16/2017    Years since quitting: 5.1   Smokeless tobacco: Never  Vaping Use   Vaping Use: Never used  Substance and Sexual Activity   Alcohol use: No   Drug use: Yes    Types: Marijuana    Comment: occ marijuana last used sept 2021   Sexual activity: Not on file

## 2023-01-02 ENCOUNTER — Telehealth: Payer: Self-pay | Admitting: Family Medicine

## 2023-01-02 NOTE — Telephone Encounter (Signed)
Called patient to schedule Medicare Annual Wellness Visit (AWV). Left message for patient to call back and schedule Medicare Annual Wellness Visit (AWV).  Last date of AWV: AWV-I PER PALMETTO 04/15/20  Please schedule an appointment at any time with [facility] or [employer] .  If any questions, please contact me at [phone].  Thank you ,  Barkley Boards AWV direct phone # [phone]   Several messages have been left   lm 01/02/23 [birth year]  12/5/212/4/23   LM 05/30/21  04/29/21  03/23/21  01/05/21, 11/12/20; AWV-I PER PALMETTO 04/15/20

## 2023-02-27 ENCOUNTER — Telehealth: Payer: Self-pay | Admitting: Family Medicine

## 2023-02-27 NOTE — Telephone Encounter (Signed)
Called patient to schedule Medicare Annual Wellness Visit (AWV). Left message for patient to call back and schedule Medicare Annual Wellness Visit (AWV).  Last date of AWV:  AWV-I PER PA*LMETTO 04/15/20  Please schedule an appointment at any time with [facility] or [employer].  If any questions, please contact me at [phone].  Thank you ,  Rudell Cobb AWV direct phone # [phone]

## 2023-03-16 ENCOUNTER — Ambulatory Visit: Payer: Medicare HMO | Admitting: Family

## 2023-03-20 ENCOUNTER — Encounter: Payer: Self-pay | Admitting: Family

## 2023-03-20 ENCOUNTER — Ambulatory Visit (INDEPENDENT_AMBULATORY_CARE_PROVIDER_SITE_OTHER): Payer: Medicare HMO | Admitting: Family

## 2023-03-20 ENCOUNTER — Telehealth: Payer: Self-pay

## 2023-03-20 DIAGNOSIS — M1711 Unilateral primary osteoarthritis, right knee: Secondary | ICD-10-CM | POA: Diagnosis not present

## 2023-03-20 NOTE — Progress Notes (Signed)
Office Visit Note   Patient: Kristi Meyers           Date of Birth: 1956/02/15           MRN: [id number] Visit Date: 03/20/2023              Requested by: Deeann Saint, MD [address],  [place] [address] PCP: Deeann Saint, MD  Chief Complaint  Patient presents with   Right Knee - Pain   Left Knee - Pain      HPI: The patient is a 67 year old woman seen in follow-up for osteoarthritis right knee she has had return of her pain locking catching giving way about 2 weeks ago.  Her last Depo-Medrol injection was February 13 of this year.  She states she has previously had supplemental injections which worked well for her.  Assessment & Plan: Visit Diagnoses: No diagnosis found.  Plan: Depo-Medrol injection right knee.  We will proceed with prior authorization for supplemental injection right knee.  She will follow-up as needed for that injection  Follow-Up Instructions: No follow-ups on file.   Right Knee Exam   Tenderness  The patient is experiencing tenderness in the medial joint line.  Range of Motion  The patient has normal right knee ROM.  Tests  Varus: negative Valgus: negative  Other  Swelling: mild      Patient is alert, oriented, no adenopathy, well-dressed, normal affect, normal respiratory effort.   Imaging: No results found. No images are attached to the encounter.  Labs: Lab Results  Component Value Date   REPTSTATUS 07/22/2022 FINAL 07/18/2022   CULT 20,000 COLONIES/mL ENTEROCOCCUS AVIUM (A) 07/18/2022   LABORGA ENTEROCOCCUS AVIUM (A) 07/18/2022     Lab Results  Component Value Date   ALBUMIN 3.8 08/24/2022   ALBUMIN 2.7 (L) 07/21/2022   ALBUMIN 2.6 (L) 07/20/2022    Lab Results  Component Value Date   MG 1.9 08/24/2022   MG 1.6 (L) 07/21/2022   MG 2.1 07/20/2022   No results found for: "VD25OH"  No results found for: "PREALBUMIN"    Latest Ref Rng & Units 08/24/2022    1:52 PM 07/21/2022    8:50 AM  07/20/2022    4:54 AM  CBC EXTENDED  WBC 4.0 - 10.5 K/uL 9.4  11.8  12.2   RBC 3.87 - 5.11 Mil/uL 3.82  2.96  3.03    2.98   Hemoglobin 12.0 - 15.0 g/dL 19.1  8.0  8.1   HCT 47.8 - 46.0 % 31.5  25.7  26.3   Platelets 150.0 - 400.0 K/uL 302.0  276  228   NEUT# 1.4 - 7.7 K/uL 5.7  7.7  7.8   Lymph# 0.7 - 4.0 K/uL 2.6  2.7  2.6      There is no height or weight on file to calculate BMI.  Orders:  No orders of the defined types were placed in this encounter.  No orders of the defined types were placed in this encounter.    Procedures: No procedures performed  Clinical Data: No additional findings.  ROS:  All other systems negative, except as noted in the HPI. Review of Systems  Constitutional: Negative.   Musculoskeletal:  Positive for arthralgias and joint swelling.    Objective: Vital Signs: LMP 11/17/2008 (Approximate)   Specialty Comments:  No specialty comments available.  PMFS History: Patient Active Problem List   Diagnosis Date Noted   Impaired ambulation 07/18/2022   ABLA (acute  blood loss anemia)    Abnormal CT scan, stomach    Epigastric pain    Long term (current) use of anticoagulants - 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 Could not afford Eliquis. 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   Postmenopausal bleeding 07/01/2020   Fibroid 07/01/2020   History of pulmonary embolus (PE) - Had saddle PE in 2019 requiring EKOS 07/01/2020   Primary localized osteoarthritis of right knee 03/14/2019   Abdominal aortic aneurysm (AAA) 3.0 cm to 5.0 cm in diameter in female [facility]) 02/18/2018   AKI (acute kidney injury) (HCC) 12/15/2017   Past Medical History:  Diagnosis Date   Abnormal EKG    hx of left bundle branch block    Acute saddle pulmonary embolus (HCC) 12/10/2017   Anginal pain (HCC)    stable   GERD (gastroesophageal reflux disease)     Heart murmur    soft systolic per dr Sharyn Lull 08-11-2020 lov   High cholesterol    Hypertension    Left leg DVT (HCC) 11/2017   pulmonary embolus   Migraine    PMB (postmenopausal bleeding)    Vertigo     No family history on file.  Past Surgical History:  Procedure Laterality Date   BIOPSY  07/17/2022   Procedure: BIOPSY;  Surgeon: Meridee Score Netty Starring., MD;  Location: [facility];  Service: Gastroenterology;;   CHOLECYSTECTOMY  2011   DILATATION & CURETTAGE/HYSTEROSCOPY WITH MYOSURE N/A 09/07/2020   Procedure: DILATATION & CURETTAGE/HYSTEROSCOPY WITH MYOSURE;  Surgeon: Romualdo Bolk, MD;  Location: [facility];  Service: Gynecology;  Laterality: N/A;   ESOPHAGOGASTRODUODENOSCOPY Left 07/17/2022   Procedure: ESOPHAGOGASTRODUODENOSCOPY (EGD);  Surgeon: Lemar Lofty., MD;  Location: [facility];  Service: Gastroenterology;  Laterality: Left;   IR ANGIOGRAM PULMONARY BILATERAL SELECTIVE  12/10/2017   IR ANGIOGRAM SELECTIVE EACH ADDITIONAL VESSEL  12/10/2017   IR ANGIOGRAM SELECTIVE EACH ADDITIONAL VESSEL  12/10/2017   IR INFUSION THROMBOL ARTERIAL INITIAL (MS)  12/10/2017   IR INFUSION THROMBOL ARTERIAL INITIAL (MS)  12/10/2017   IR THROMB F/U EVAL ART/VEN FINAL DAY (MS)  12/11/2017   IR US GUIDE VASC ACCESS RIGHT  12/10/2017   UPPER ESOPHAGEAL ENDOSCOPIC ULTRASOUND (EUS) N/A 07/17/2022   Procedure: UPPER ESOPHAGEAL ENDOSCOPIC ULTRASOUND (EUS);  Surgeon: Lemar Lofty., MD;  Location: [facility];  Service: Gastroenterology;  Laterality: N/A;   Social History   Occupational History   Not on file  Tobacco Use   Smoking status: Former    Packs/day: 0.25    Years: 20.00    Additional pack years: 0.00    Total pack years: 5.00    Types: Cigarettes    Quit date: 10/16/2017    Years since quitting: 5.4   Smokeless tobacco: Never  Vaping Use   Vaping Use: Never used  Substance and Sexual Activity   Alcohol use: No   Drug use: Yes    Types:  Marijuana    Comment: occ marijuana last used sept 2021   Sexual activity: Not on file

## 2023-03-20 NOTE — Telephone Encounter (Signed)
VOB submitted for Monovisc, right knee  

## 2023-03-20 NOTE — Telephone Encounter (Signed)
-----   Message from Adonis Huguenin, NP sent at 03/20/2023 10:34 AM EDT ----- Supp injection right knee please

## 2023-04-12 ENCOUNTER — Telehealth (HOSPITAL_BASED_OUTPATIENT_CLINIC_OR_DEPARTMENT_OTHER): Payer: Self-pay

## 2023-04-12 NOTE — Telephone Encounter (Signed)
Lvm for pt cb to schedule

## 2023-04-12 NOTE — Telephone Encounter (Signed)
Approved Monovisc-Right knee B&B $15 copay Once OOP met it is covered at 100% No prior auth required

## 2023-05-01 ENCOUNTER — Ambulatory Visit: Payer: Medicare HMO | Admitting: Family

## 2023-05-01 ENCOUNTER — Encounter: Payer: Self-pay | Admitting: Family

## 2023-05-01 DIAGNOSIS — M1711 Unilateral primary osteoarthritis, right knee: Secondary | ICD-10-CM | POA: Diagnosis not present

## 2023-05-01 MED ORDER — HYALURONAN 88 MG/4ML IX SOSY
88.0000 mg | PREFILLED_SYRINGE | INTRA_ARTICULAR | Status: AC | PRN
Start: 2023-05-01 — End: 2023-05-01
  Administered 2023-05-01: 88 mg via INTRA_ARTICULAR

## 2023-05-01 MED ORDER — LIDOCAINE HCL 1 % IJ SOLN
1.0000 mL | INTRAMUSCULAR | Status: AC | PRN
Start: 2023-05-01 — End: 2023-05-01
  Administered 2023-05-01: 1 mL

## 2023-05-01 NOTE — Progress Notes (Signed)
Office Visit Note   Patient: Kristi Meyers           Date of Birth: 29-Nov-1955           MRN: [id number] Visit Date: 05/01/2023              Requested by: Deeann Saint, MD [address] [place],  [place] [address] PCP: Deeann Saint, MD  Chief Complaint  Patient presents with   Right Knee - Follow-up      HPI: The patient is a 67 year old woman who presents today for planned Monovisc injection right knee for her chronic knee pain she has previously had supplemental injections  Assessment & Plan: Visit Diagnoses: No diagnosis found.  Plan: Monovisc injection right knee.  Patient tolerated well.  Discussed ice anti-inflammatories for the next few days she will follow-up as needed  Follow-Up Instructions: Return in about 6 months (around 11/01/2023).   Right Knee Exam   Muscle Strength  The patient has normal right knee strength.  Tenderness  The patient is experiencing tenderness in the medial joint line.  Range of Motion  The patient has normal right knee ROM.      Patient is alert, oriented, no adenopathy, well-dressed, normal affect, normal respiratory effort.   Imaging: No results found. No images are attached to the encounter.  Labs: Lab Results  Component Value Date   REPTSTATUS 07/22/2022 FINAL 07/18/2022   CULT 20,000 COLONIES/mL ENTEROCOCCUS AVIUM (A) 07/18/2022   LABORGA ENTEROCOCCUS AVIUM (A) 07/18/2022     Lab Results  Component Value Date   ALBUMIN 3.8 08/24/2022   ALBUMIN 2.7 (L) 07/21/2022   ALBUMIN 2.6 (L) 07/20/2022    Lab Results  Component Value Date   MG 1.9 08/24/2022   MG 1.6 (L) 07/21/2022   MG 2.1 07/20/2022   No results found for: "VD25OH"  No results found for: "PREALBUMIN"    Latest Ref Rng & Units 08/24/2022    1:52 PM 07/21/2022    8:50 AM 07/20/2022    4:54 AM  CBC EXTENDED  WBC 4.0 - 10.5 K/uL 9.4  11.8  12.2   RBC 3.87 - 5.11 Mil/uL 3.82  2.96  3.03    2.98   Hemoglobin 12.0 - 15.0 g/dL 84.6   8.0  8.1   HCT 96.2 - 46.0 % 31.5  25.7  26.3   Platelets 150.0 - 400.0 K/uL 302.0  276  228   NEUT# 1.4 - 7.7 K/uL 5.7  7.7  7.8   Lymph# 0.7 - 4.0 K/uL 2.6  2.7  2.6      There is no height or weight on file to calculate BMI.  Orders:  No orders of the defined types were placed in this encounter.  No orders of the defined types were placed in this encounter.    Procedures: Large Joint Inj on 05/01/2023 8:37 AM Indications: pain Details: 18 G 1.5 in needle, anteromedial approach Medications: 1 mL lidocaine 1 %; 88 mg Hyaluronan 88 MG/4ML Consent was given by the patient.      Clinical Data: No additional findings.  ROS:  All other systems negative, except as noted in the HPI. Review of Systems  Objective: Vital Signs: LMP 11/17/2008 (Approximate)   Specialty Comments:  No specialty comments available.  PMFS History: Patient Active Problem List   Diagnosis Date Noted   Impaired ambulation 07/18/2022   ABLA (acute blood loss anemia)    Abnormal CT scan, stomach    Epigastric  pain    Long term (current) use of anticoagulants - 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 Could not afford Eliquis. 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   Postmenopausal bleeding 07/01/2020   Fibroid 07/01/2020   History of pulmonary embolus (PE) - Had saddle PE in 2019 requiring EKOS 07/01/2020   Primary localized osteoarthritis of right knee 03/14/2019   Abdominal aortic aneurysm (AAA) 3.0 cm to 5.0 cm in diameter in female [facility]) 02/18/2018   AKI (acute kidney injury) (HCC) 12/15/2017   Past Medical History:  Diagnosis Date   Abnormal EKG    hx of left bundle branch block    Acute saddle pulmonary embolus (HCC) 12/10/2017   Anginal pain (HCC)    stable   GERD (gastroesophageal reflux disease)    Heart murmur    soft systolic per dr Sharyn Lull 08-11-2020 lov   High  cholesterol    Hypertension    Left leg DVT (HCC) 11/2017   pulmonary embolus   Migraine    PMB (postmenopausal bleeding)    Vertigo     History reviewed. No pertinent family history.  Past Surgical History:  Procedure Laterality Date   BIOPSY  07/17/2022   Procedure: BIOPSY;  Surgeon: Meridee Score Netty Starring., MD;  Location: [facility];  Service: Gastroenterology;;   CHOLECYSTECTOMY  2011   DILATATION & CURETTAGE/HYSTEROSCOPY WITH MYOSURE N/A 09/07/2020   Procedure: DILATATION & CURETTAGE/HYSTEROSCOPY WITH MYOSURE;  Surgeon: Romualdo Bolk, MD;  Location[facility];  Service: Gynecology;  Laterality: N/A;   ESOPHAGOGASTRODUODENOSCOPY Left 07/17/2022   Procedure: ESOPHAGOGASTRODUODENOSCOPY (EGD);  Surgeon: Lemar Lofty., MD;  Location: [facility];  Service: Gastroenterology;  Laterality: Left;   IR ANGIOGRAM PULMONARY BILATERAL SELECTIVE  12/10/2017   IR ANGIOGRAM SELECTIVE EACH ADDITIONAL VESSEL  12/10/2017   IR ANGIOGRAM SELECTIVE EACH ADDITIONAL VESSEL  12/10/2017   IR INFUSION THROMBOL ARTERIAL INITIAL (MS)  12/10/2017   IR INFUSION THROMBOL ARTERIAL INITIAL (MS)  12/10/2017   IR THROMB F/U EVAL ART/VEN FINAL DAY (MS)  12/11/2017   IR US GUIDE VASC ACCESS RIGHT  12/10/2017   UPPER ESOPHAGEAL ENDOSCOPIC ULTRASOUND (EUS) N/A 07/17/2022   Procedure: UPPER ESOPHAGEAL ENDOSCOPIC ULTRASOUND (EUS);  Surgeon: Lemar Lofty., MD;  Location: [facility];  Service: Gastroenterology;  Laterality: N/A;   Social History   Occupational History   Not on file  Tobacco Use   Smoking status: Former    Current packs/day: 0.00    Average packs/day: 0.3 packs/day for 20.0 years (5.0 ttl pk-yrs)    Types: Cigarettes    Start date: 10/16/1997    Quit date: 10/16/2017    Years since quitting: 5.5   Smokeless tobacco: Never  Vaping Use   Vaping status: Never Used  Substance and Sexual Activity   Alcohol use: No   Drug use: Yes    Types: Marijuana    Comment:  occ marijuana last used sept 2021   Sexual activity: Not on file

## 2023-05-30 ENCOUNTER — Ambulatory Visit: Payer: Self-pay | Admitting: *Deleted

## 2023-05-30 NOTE — Telephone Encounter (Signed)
Second attempt to reach pt. Left message to call back about symptoms. 

## 2023-05-30 NOTE — Telephone Encounter (Signed)
  Chief Complaint: bilateral feet swelling and pain per patient daughter on DPR. Requesting new patient appt earlier than December at [facility] with Dr. Alvis Lemmings Symptoms: bilateral feet swelling like Balloons. Difficulty walking. Reports after walking on feet one day it takes patient 2 days off feet to feel better. Numbness to bottom of feet  Frequency: months  Pertinent Negatives: Patient denies chest pain no difficulty breathing no fever reported Disposition: [] ED /[] Urgent Care (no appt availability in office) / [] Appointment(In office/virtual)/ []  [place] [facility]/ [] Home Care/ [] Refused Recommended Disposition /[x] [place] [facility]/ []  Follow-up with PCP Additional Notes:   Recommended patient 's daughter contact patient PCP until new patient appt with Dr. Alvis Lemmings completed at [facility] in December. Patient on waitlist for earlier appt. Patient daughter requesting patient take daughter's appt. Reviewed patient has to be established patient  and can not take daughters appt earlier than Dec. Recommended mobile bus today and daughter declined    Reason for Disposition  [1] Swollen foot AND [2] no fever  (Exceptions: localized bump from bunions, calluses, insect bite, sting)  Answer Assessment - Initial Assessment Questions 1. ONSET: "When did the pain start?"      Months  2. LOCATION: "Where is the pain located?"      Bottom of feet  3. PAIN: "How bad is the pain?"    (Scale 1-10; or mild, moderate, severe)  - MILD (1-3): doesn't interfere with normal activities.   - MODERATE (4-7): interferes with normal activities (e.g., work or school) or awakens from sleep, limping.   - SEVERE (8-10): excruciating pain, unable to do any normal activities, unable to walk.      Pain with walking, numbness 4. WORK OR EXERCISE: "Has there been any recent work or exercise that involved this part of the body?"      Na  5. CAUSE: "What do you think is causing the foot pain?"     Swelling  6. OTHER SYMPTOMS:  "Do you have any other symptoms?" (e.g., leg pain, rash, fever, numbness)     Feet swollen like Balloons per daughter . Numbness bottom of feet  7. PREGNANCY: "Is there any chance you are pregnant?" "When was your last menstrual period?"     na  Protocols used: Foot Pain-A-AH

## 2023-05-30 NOTE — Telephone Encounter (Signed)
Summary: swollen feet, difficult to walk on   Daughter Alvis Lemmings called stated her mothers Feet are swollen and painful to walk on, this has been going on for a few weeks and her current provider is not helping. Please advise        Called patient [phone] to review sx of feet swelling. No answer, LVMTCB #[phone].

## 2023-08-27 ENCOUNTER — Telehealth: Payer: Self-pay | Admitting: Orthopedic Surgery

## 2023-08-27 NOTE — Telephone Encounter (Signed)
Talked with patients daughter and scheduled appointment for cortisone injection.  Next available gel injection would need to be after 10/31/2022 Will submit in January, 2025

## 2023-08-27 NOTE — Telephone Encounter (Signed)
Pt's daughter called requesting to submit to insurance for right knee gel injection. Please call pt when approved

## 2023-09-25 ENCOUNTER — Ambulatory Visit: Payer: Medicare HMO | Attending: Family Medicine | Admitting: Family Medicine

## 2023-09-25 ENCOUNTER — Encounter: Payer: Self-pay | Admitting: Family Medicine

## 2023-09-25 VITALS — BP 132/76 | HR 54 | Ht 64.0 in

## 2023-09-25 DIAGNOSIS — Z1231 Encounter for screening mammogram for malignant neoplasm of breast: Secondary | ICD-10-CM

## 2023-09-25 DIAGNOSIS — I10 Essential (primary) hypertension: Secondary | ICD-10-CM

## 2023-09-25 DIAGNOSIS — F3289 Other specified depressive episodes: Secondary | ICD-10-CM | POA: Diagnosis not present

## 2023-09-25 DIAGNOSIS — Z23 Encounter for immunization: Secondary | ICD-10-CM

## 2023-09-25 DIAGNOSIS — N951 Menopausal and female climacteric states: Secondary | ICD-10-CM

## 2023-09-25 DIAGNOSIS — Z1159 Encounter for screening for other viral diseases: Secondary | ICD-10-CM

## 2023-09-25 DIAGNOSIS — I714 Abdominal aortic aneurysm, without rupture, unspecified: Secondary | ICD-10-CM

## 2023-09-25 DIAGNOSIS — Z86711 Personal history of pulmonary embolism: Secondary | ICD-10-CM

## 2023-09-25 DIAGNOSIS — M25473 Effusion, unspecified ankle: Secondary | ICD-10-CM | POA: Diagnosis not present

## 2023-09-25 DIAGNOSIS — F32A Depression, unspecified: Secondary | ICD-10-CM | POA: Insufficient documentation

## 2023-09-25 DIAGNOSIS — Z131 Encounter for screening for diabetes mellitus: Secondary | ICD-10-CM

## 2023-09-25 MED ORDER — VEOZAH 45 MG PO TABS: 1.0000 | ORAL_TABLET | Freq: Every day | ORAL | 1 refills | Status: AC

## 2023-09-25 MED ORDER — FUROSEMIDE 20 MG PO TABS: 20.0000 mg | ORAL_TABLET | Freq: Every day | ORAL | 1 refills | Status: AC | PRN

## 2023-09-25 NOTE — Patient Instructions (Signed)
 Managing Hot Flashes During Menopause You will learn what hot flashes/night sweats are, what causes them and what the treatment methods are. To view the content, go to this web address: https://pe.elsevier.com/[email or web address]  This video will expire on: 04/08/2025. If you need access to this video following this date, please reach out to the healthcare provider who assigned it to you. This information is not intended to replace advice given to you by your health care provider. Make sure you discuss any questions you have with your health care provider. Elsevier Patient Education  2024 [employer].

## 2023-09-25 NOTE — Progress Notes (Signed)
Subjective:  Patient ID: Kristi Meyers, female    DOB: 1955/12/31  Age: 67 y.o. MRN: [id number]  CC: New Patient (Initial Visit) (Hot Flashes/Swelling in feet and ankles/Needs referrals.)   HPI Kristi Meyers is a 67 y.o. year old female with a history of hypertension, hyperlipidemia, history of recurrent DVT and PE, right knee OA, abdominal aortic aneurysm.  Interval History: Discussed the use of AI scribe software for clinical note transcription with the patient, who gave verbal consent to proceed.   She presents with hot flashes and left ankle swelling. The hot flashes, which have been ongoing for 'a while,' are primarily nocturnal but occasionally occur during the day. She has not previously taken medication for this symptom.  The left ankle swelling, which has been present 'ever since' her history of blood clots, is worse with standing and improves with elevation. She reports using compression stockings for this symptom. She also reports intermittent shortness of breath with exertion.  EF was 58% from nuclear medicine scan in 04/2019.  She has a known abdominal aortic aneurysm, last measured at 3.6 cm in September of 2023. She also has osteoarthritis of the right knee, for which she sees an orthopedic.   Daughter also accompanies her to this visit and is requesting a referral to psychiatry and a therapist.    Past Medical History:  Diagnosis Date   Abnormal EKG    hx of left bundle branch block    Acute saddle pulmonary embolus (HCC) 12/10/2017   Anginal pain (HCC)    stable   GERD (gastroesophageal reflux disease)    Heart murmur    soft systolic per dr Sharyn Lull 08-11-2020 lov   High cholesterol    Hypertension    Left leg DVT (HCC) 11/2017   pulmonary embolus   Migraine    PMB (postmenopausal bleeding)    Vertigo     Past Surgical History:  Procedure Laterality Date   BIOPSY  07/17/2022   Procedure: BIOPSY;  Surgeon: Lemar Lofty., MD;  Location: [facility];  Service: Gastroenterology;;   [place]as  2011   DILATATION & CURETTAGE/HYSTEROSCOPY WITH MYOSURE N/A 09/07/2020   Procedure: DILATATION & CURETTAGE/HYSTEROSCOPY WITH MYOSURE;  Surgeon: Romualdo Bolk, MD;  Location: [facility];  Service: Gynecology;  Laterality: N/A;   ESOPHAGOGASTRODUODENOSCOPY Left 07/17/2022   Procedure: ESOPHAGOGASTRODUODENOSCOPY (EGD);  Surgeon: Lemar Lofty., MD;  Location: [facility];  Service: Gastroenterology;  Laterality: Left;   IR ANGIOGRAM PULMONARY BILATERAL SELECTIVE  12/10/2017   IR ANGIOGRAM SELECTIVE EACH ADDITIONAL VESSEL  12/10/2017   IR ANGIOGRAM SELECTIVE EACH ADDITIONAL VESSEL  12/10/2017   IR INFUSION THROMBOL ARTERIAL INITIAL (MS)  12/10/2017   IR INFUSION THROMBOL ARTERIAL INITIAL (MS)  12/10/2017   IR THROMB F/U EVAL ART/VEN FINAL DAY (MS)  12/11/2017   IR US GUIDE VASC ACCESS RIGHT  12/10/2017   UPPER ESOPHAGEAL ENDOSCOPIC ULTRASOUND (EUS) N/A 07/17/2022   Procedure: UPPER ESOPHAGEAL ENDOSCOPIC ULTRASOUND (EUS);  Surgeon: Lemar Lofty., MD;  Location: [facility];  Service: Gastroenterology;  Laterality: N/A;    No family history on file.  Social History   Socioeconomic History   Marital status: Single    Spouse name: Not on file   Number of children: Not on file   Years of education: Not on file   Highest education level: Not on file  Occupational History   Not on file  Tobacco Use   Smoking status: Former    Current packs/day: 0.00  Average packs/day: 0.3 packs/day for 20.0 years (5.0 ttl pk-yrs)    Types: Cigarettes    Start date: 10/16/1997    Quit date: 10/16/2017    Years since quitting: 5.9   Smokeless tobacco: Never  Vaping Use   Vaping status: Never Used  Substance and Sexual Activity   Alcohol use: No   Drug use: Yes    Types: Marijuana    Comment: occ marijuana last used sept 2021   Sexual activity: Not on file  Other Topics Concern   Not on file  Social History  Narrative   Not on file   Social Determinants of Health   Financial Resource Strain: Not on file  Food Insecurity: No Food Insecurity (09/25/2023)   Hunger Vital Sign    Worried About Running Out of Food in the Last Year: Never true    Ran Out of Food in the Last Year: Never true  Transportation Needs: No Transportation Needs (09/25/2023)   PRAPARE - Administrator, Civil Service (Medical): No    Lack of Transportation (Non-Medical): No  Physical Activity: Inactive (09/25/2023)   Exercise Vital Sign    Days of Exercise per Week: 0 days    Minutes of Exercise per Session: 0 min  Stress: Not on file  Social Connections: Unknown (09/25/2023)   Social Connection and Isolation Panel [NHANES]    Frequency of Communication with Friends and Family: More than three times a week    Frequency of Social Gatherings with Friends and Family: More than three times a week    Attends Religious Services: Not on Marketing executive or Organizations: Not on file    Attends Banker Meetings: Not on file    Marital Status: Not on file    No Known Allergies  Outpatient Medications Prior to Visit  Medication Sig Dispense Refill   acetaminophen (TYLENOL) 500 MG tablet Take 500 mg by mouth every 6 (six) hours as needed.     amLODipine (NORVASC) 5 MG tablet Take 5 mg by mouth daily.     atorvastatin (LIPITOR) 40 MG tablet Take 40 mg by mouth at bedtime.      carvedilol (COREG) 25 MG tablet Take 25 mg by mouth 2 (two) times daily with a meal.     iron polysaccharides (NIFEREX) 150 MG capsule Take 1 capsule (150 mg total) by mouth daily. 30 capsule 0   meclizine (ANTIVERT) 12.5 MG tablet Take 12.5 mg by mouth 2 (two) times daily.     nitroGLYCERIN (NITROSTAT) 0.4 MG SL tablet Place under the tongue.     ondansetron (ZOFRAN) 4 MG tablet Take 1 tablet (4 mg total) by mouth every 6 (six) hours as needed for nausea. 20 tablet 0   polyethylene glycol (MIRALAX / GLYCOLAX) 17 g  packet Take 17 g by mouth 2 (two) times daily. 14 each 0   senna-docusate (SENOKOT-S) 8.6-50 MG tablet Take 1 tablet by mouth at bedtime.     warfarin (COUMADIN) 4 MG tablet Take 1 tablet (4 mg total) by mouth daily at 4 PM. 4 mg daily and follow up with Dr. Sharyn Lull within 1 week 30 tablet 0   pantoprazole (PROTONIX) 40 MG tablet Take 1 tablet (40 mg total) by mouth daily. 30 tablet 0   No facility-administered medications prior to visit.     ROS Review of Systems  Constitutional:  Negative for activity change and appetite change.  HENT:  Negative for sinus pressure  and sore throat.   Respiratory:  Negative for chest tightness, shortness of breath and wheezing.   Cardiovascular:  Positive for leg swelling. Negative for chest pain and palpitations.  Gastrointestinal:  Negative for abdominal distention, abdominal pain and constipation.  Genitourinary: Negative.   Musculoskeletal: Negative.   Psychiatric/Behavioral:  Negative for behavioral problems and dysphoric mood.     Objective:  BP 132/76   Pulse (!) 54   Ht 5\' 4"  (1.626 m)   LMP 11/17/2008 (Approximate)   SpO2 99%   BMI 33.49 kg/m      09/25/2023    9:06 AM 08/24/2022    1:11 PM 07/22/2022    3:20 PM  BP/Weight  Systolic BP 132 120 155  Diastolic BP 76 84 91      Physical Exam Constitutional:      Appearance: She is well-developed.  Cardiovascular:     Rate and Rhythm: Bradycardia present.     Heart sounds: Normal heart sounds. No murmur heard. Pulmonary:     Effort: Pulmonary effort is normal.     Breath sounds: Normal breath sounds. No wheezing or rales.  Chest:     Chest wall: No tenderness.  Abdominal:     General: Bowel sounds are normal. There is no distension.     Palpations: Abdomen is soft. There is no mass.     Tenderness: There is no abdominal tenderness.  Musculoskeletal:        General: Normal range of motion.     Right lower leg: No edema.     Left lower leg: No edema.  Neurological:      Mental Status: She is alert and oriented to person, place, and time.  Psychiatric:        Mood and Affect: Mood normal.        Latest Ref Rng & Units 08/24/2022    1:52 PM 07/21/2022    8:50 AM 07/20/2022    4:54 AM  CMP  Glucose 70 - 99 mg/dL 027  253  92   BUN 6 - 23 mg/dL 12  15  25    Creatinine 0.40 - 1.20 mg/dL 6.64  4.03  4.74   Sodium 135 - 145 mEq/L 144  143  145   Potassium 3.5 - 5.1 mEq/L 4.0  3.3  3.8   Chloride 96 - 112 mEq/L 108  111  115   CO2 19 - 32 mEq/L 30  23  23    Calcium 8.4 - 10.5 mg/dL 9.5  7.8  8.2   Total Protein 6.0 - 8.3 g/dL 7.2  6.0  6.2   Total Bilirubin 0.2 - 1.2 mg/dL 0.5  0.7  0.7   Alkaline Phos 39 - 117 U/L 72  50  53   AST 0 - 37 U/L 13  14  14    ALT 0 - 35 U/L 9  16  15      Lipid Panel  No results found for: "CHOL", "TRIG", "HDL", "CHOLHDL", "VLDL", "LDLCALC", "LDLDIRECT"  CBC    Component Value Date/Time   WBC 9.4 08/24/2022 1352   RBC 3.82 (L) 08/24/2022 1352   HGB 10.1 (L) 08/24/2022 1352   HCT 31.5 (L) 08/24/2022 1352   PLT 302.0 08/24/2022 1352   MCV 82.4 08/24/2022 1352   MCH 27.0 07/21/2022 0850   MCHC 32.0 08/24/2022 1352   RDW 18.0 (H) 08/24/2022 1352   LYMPHSABS 2.6 08/24/2022 1352   MONOABS 0.8 08/24/2022 1352   EOSABS 0.2 08/24/2022 1352   BASOSABS  0.1 08/24/2022 1352    No results found for: "HGBA1C"  Assessment & Plan:      Abdominal Aortic Aneurysm Stable at 3.6 cm as of September 2023. No current symptoms. -Continue annual monitoring of aneurysm size. -Order CTA at next visit.  Vasomotor menopausal symptoms Predominantly nocturnal, some daytime episodes. No prior treatment. Hormone therapy contraindicated due to history of blood clots. -Start Veozah for hot flashes, pending insurance approval.  Dependent Edema Predominantly in left ankle, intermittent, associated with prolonged standing. History of blood clots in legs. -Prescribe Lasix as needed for swelling. -Order blood tests to check for fluid  retention around the heart. -Refer to cardiologist for shortness of breath with exertion. -Continue compression stockings, low-sodium diet, elevate leg   Hypertension Controlled -Continue current regimen -Counseled on blood pressure goal of less than 130/80, low-sodium, DASH diet, medication compliance, 150 minutes of moderate intensity exercise per week. Discussed medication compliance, adverse effects.   History of recurrent PE and DVT -Continue lifelong anticoagulation   General Health Maintenance -Administer influenza and pneumonia vaccines today. -Order mammogram, bone density test, and colon cancer screening for future appointments. -Refer to psychiatrist for mental health support.  Follow-up in 3 months.          Meds ordered this encounter  Medications   Fezolinetant (VEOZAH) 45 MG TABS    Sig: Take 1 tablet (45 mg total) by mouth daily.    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    Dispense:  30 tablet    Refill:  1    Follow-up: Return in about 3 months (around 12/24/2023) for Chronic medical conditions.       Hoy Register, MD, FAAFP. [facility] [place], [place] [phone]   09/25/2023, 11:44 AM

## 2023-09-26 LAB — CBC WITH DIFFERENTIAL/PLATELET
Basophils Absolute: 0.1 10*3/uL (ref 0.0–0.2)
Basos: 1 %
EOS (ABSOLUTE): 0.5 10*3/uL — ABNORMAL HIGH (ref 0.0–0.4)
Eos: 6 %
Hematocrit: 36.6 % (ref 34.0–46.6)
Hemoglobin: 11.1 g/dL (ref 11.1–15.9)
Immature Grans (Abs): 0 10*3/uL (ref 0.0–0.1)
Immature Granulocytes: 0 %
Lymphocytes Absolute: 2 10*3/uL (ref 0.7–3.1)
Lymphs: 26 %
MCH: 26.5 pg — ABNORMAL LOW (ref 26.6–33.0)
MCHC: 30.3 g/dL — ABNORMAL LOW (ref 31.5–35.7)
MCV: 87 fL (ref 79–97)
Monocytes Absolute: 0.6 10*3/uL (ref 0.1–0.9)
Monocytes: 7 %
Neutrophils Absolute: 4.6 10*3/uL (ref 1.4–7.0)
Neutrophils: 60 %
Platelets: 275 10*3/uL (ref 150–450)
RBC: 4.19 x10E6/uL (ref 3.77–5.28)
RDW: 16.3 % — ABNORMAL HIGH (ref 11.7–15.4)
WBC: 7.8 10*3/uL (ref 3.4–10.8)

## 2023-09-26 LAB — LP+NON-HDL CHOLESTEROL
Cholesterol, Total: 125 mg/dL (ref 100–199)
HDL: 36 mg/dL — ABNORMAL LOW (ref >39)
LDL Chol Calc (NIH): 75 mg/dL (ref 0–99)
Total Non-HDL-Chol (LDL+VLDL): 89 mg/dL (ref 0–129)
Triglycerides: 70 mg/dL (ref 0–149)
VLDL Cholesterol Cal: 14 mg/dL (ref 5–40)

## 2023-09-26 LAB — CMP14+EGFR
ALT: 9 [IU]/L (ref 0–32)
AST: 15 [IU]/L (ref 0–40)
Albumin: 3.8 g/dL — ABNORMAL LOW (ref 3.9–4.9)
Alkaline Phosphatase: 90 [IU]/L (ref 44–121)
BUN/Creatinine Ratio: 16 (ref 12–28)
BUN: 16 mg/dL (ref 8–27)
Bilirubin Total: 0.5 mg/dL (ref 0.0–1.2)
CO2: 24 mmol/L (ref 20–29)
Calcium: 9.2 mg/dL (ref 8.7–10.3)
Chloride: 109 mmol/L — ABNORMAL HIGH (ref 96–106)
Creatinine, Ser: 0.98 mg/dL (ref 0.57–1.00)
Globulin, Total: 2.8 g/dL (ref 1.5–4.5)
Glucose: 91 mg/dL (ref 70–99)
Potassium: 4.3 mmol/L (ref 3.5–5.2)
Sodium: 144 mmol/L (ref 134–144)
Total Protein: 6.6 g/dL (ref 6.0–8.5)
eGFR: 62 mL/min/{1.73_m2} (ref >59)

## 2023-09-26 LAB — HEMOGLOBIN A1C
Est. average glucose Bld gHb Est-mCnc: 117 mg/dL
Hgb A1c MFr Bld: 5.7 % — ABNORMAL HIGH (ref 4.8–5.6)

## 2023-09-26 LAB — HCV INTERPRETATION

## 2023-09-26 LAB — HCV AB W REFLEX TO QUANT PCR: HCV Ab: NONREACTIVE

## 2023-09-28 ENCOUNTER — Telehealth: Payer: Self-pay

## 2023-09-28 NOTE — Telephone Encounter (Signed)
Copied from CRM [id number]. Topic: General - Other >> Sep 28, 2023  9:30 AM Franchot Heidelberg wrote: Reason for CRM: Pt's daughter called reporting that the patient's insurance will not cover Fezolinetant (VEOZAH) 45 MG TABS. They are requesting an alternative.

## 2023-09-28 NOTE — Telephone Encounter (Signed)
Routing to PCP for review.

## 2023-10-01 MED ORDER — PAROXETINE HCL 10 MG PO TABS: 10.0000 mg | ORAL_TABLET | Freq: Every day | ORAL | 1 refills | Status: AC

## 2023-10-01 NOTE — Addendum Note (Signed)
Addended by: Hoy Register on: 10/01/2023 12:55 PM   Modules accepted: Orders

## 2023-10-01 NOTE — Telephone Encounter (Signed)
I have sent a prescription for Paxil to the pharmacy in place of [facility].

## 2023-10-03 NOTE — Telephone Encounter (Signed)
Mailbox is currently full. 

## 2023-10-15 ENCOUNTER — Ambulatory Visit: Payer: Self-pay

## 2023-10-15 ENCOUNTER — Telehealth: Payer: Self-pay

## 2023-10-15 ENCOUNTER — Telehealth: Payer: Self-pay | Admitting: Family Medicine

## 2023-10-15 NOTE — Telephone Encounter (Addendum)
Message from [place] F sent at 10/15/2023  4:19 PM EST  Summary: Fall   Pt's daughter Kristi Meyers is calling in because pt had a fall last week. Pt didn't hit her had and had no headaches with the fall but she is having difficulty walking.        Called daughter and got a little information. Called pt and LM on VM to call back before 6.   Called pt and left another message. Called daughter Kristi Meyers back and told her was unable to get pt on the phone. Pt has been c/o knee pain stating she needs a knee replacement. Pt has fallen   of 2 times and refused to go to ED each time. Offered mobile bus and refused. Pt needs sooner appt than what is available.  Daughter requesting pt have a home aide to help with pt with bathing and setting her up for the day. Advised daughter for pt to use the walker full time and no longer use the cane.  Answer Assessment - Initial Assessment Questions 1. MECHANISM: "How did the fall happen?"     Knee gave out  2. DOMESTIC VIOLENCE AND ELDER ABUSE SCREENING: "Did you fall because someone pushed you or tried to hurt you?" If Yes, ask: "Are you safe now?"     *No Answer* 3. ONSET: "When did the fall happen?" (e.g., minutes, hours, or days ago)     Last week  4. LOCATION: "What part of the body hit the ground?" (e.g., back, buttocks, head, hips, knees, hands, head, stomach)     Legs  5. INJURY: "Did you hurt (injure) yourself when you fell?" If Yes, ask: "What did you injure? Tell me more about this?" (e.g., body area; type of injury; pain severity)"     *No Answer* 6. PAIN: "Is there any pain?" If Yes, ask: "How bad is the pain?" (e.g., Scale 1-10; or mild,  moderate, severe)   - NONE (0): No pain   - MILD (1-3): Doesn't interfere with normal activities    - MODERATE (4-7): Interferes with normal activities or awakens from sleep    - SEVERE (8-10): Excruciating pain, unable to do any normal activities      *No Answer* 7. SIZE: For cuts, bruises, or swelling, ask: "How large is  it?" (e.g., inches or centimeters)      *No Answer* 9. OTHER SYMPTOMS: "Do you have any other symptoms?" (e.g., dizziness, fever, weakness; new onset or worsening).      Difficulty walking due to pain and knee giving out 10. CAUSE: "What do you think caused the fall (or falling)?" (e.g., tripped, dizzy spell)       *No Answer*  Protocols used: Falls and [facility]

## 2023-10-15 NOTE — Telephone Encounter (Signed)
Pt's daughter Alvis Lemmings is calling in requesting a generic version of Fezolinetant (VEOZAH) 45 MG TABS [[id number]] be sent in because insurance won't cover it. Dawn says she is going to reach out to [employer]. Dawn is also requesting a referral be sent in for a therapist/psychiatrist for pt as well. Dawn would like to speak to Erskine Squibb regarding the referral.

## 2023-10-15 NOTE — Telephone Encounter (Signed)
Copied from CRM [id number]. Topic: General - Other >> Oct 15, 2023  4:17 PM Everette C wrote: Reason for CRM: The patient has been directed to contact their PCP and request completion of prior authorization for their Fezolinetant (VEOZAH) 45 MG TABS [[id number]]   Please contact further when possible

## 2023-10-16 NOTE — Telephone Encounter (Signed)
Is this something that we can assist with.

## 2023-10-16 NOTE — Telephone Encounter (Signed)
Dawn Annia Friendly is requesting that you reach out to her regarding her mother. I have sent a message to [place] regarding the medication questions

## 2023-10-16 NOTE — Telephone Encounter (Signed)
 I spoke to patient's daughter, Stephane, and she that her mother fell last week, did not hit her head.  She has discussed this with Calton Daring , RN earlier today and has been advised to take her mother to [facility] or ED and she said her mother refuses but she will take her if she notices any changes in her mother's condition.  Dawn is aware of the dangers of bleeding because her mother is on warfarin.  Dawn then said that her mother would benefit from therapy to address her mental health concerns. She said her mother is having difficulty coping with the recent loss of her granddaughter ( Dawn's daughter).  I explained that I can ask Charmaine Hurst, LCSWA to call her [facility]) and discuss options for therapy.  Dawn was in agreement.  Charmaine, can you please call Dawn to discuss the therapy options for her mother?  Thanks

## 2023-10-16 NOTE — Telephone Encounter (Signed)
 Spoke with [employer]. Verified Kristi Meyers is on patient DPR. Kristi Meyers voiced that patient needs earlier appointment . Patient has had difficulty walker and has had multiple falls . Patient given for 11/07/2022. Advised that patient taking Warfarin and this could cause unseen bleeding and or prolonged bleeding and patient should be seen at [facility] or ED. Kristi Meyers verbalized understanding of all discussed.

## 2023-10-23 ENCOUNTER — Telehealth: Payer: Self-pay | Admitting: Licensed Clinical Social Worker

## 2023-10-23 NOTE — Telephone Encounter (Signed)
 noted

## 2023-10-23 NOTE — Telephone Encounter (Signed)
 LCSWA called patient today to introduce herself and to assess patients' mental health needs. Patient did not answer the phone. LCSWA was able to leave a brief message with the patient asking them to return the call. Patient was referred by PCP and [place] for therapy options for grief and stress.

## 2023-10-23 NOTE — Telephone Encounter (Signed)
 Received and family was contacted.

## 2023-11-08 ENCOUNTER — Ambulatory Visit: Payer: Medicare HMO | Admitting: Family Medicine

## 2023-11-28 ENCOUNTER — Other Ambulatory Visit: Payer: Self-pay

## 2023-11-28 ENCOUNTER — Ambulatory Visit: Payer: Medicare HMO | Admitting: Family

## 2023-11-28 DIAGNOSIS — M1711 Unilateral primary osteoarthritis, right knee: Secondary | ICD-10-CM

## 2023-12-04 ENCOUNTER — Ambulatory Visit: Payer: Medicare HMO | Attending: Family Medicine

## 2023-12-04 VITALS — Ht 64.0 in | Wt 195.0 lb

## 2023-12-04 DIAGNOSIS — Z0000 Encounter for general adult medical examination without abnormal findings: Secondary | ICD-10-CM

## 2023-12-04 DIAGNOSIS — Z1211 Encounter for screening for malignant neoplasm of colon: Secondary | ICD-10-CM

## 2023-12-04 DIAGNOSIS — Z13820 Encounter for screening for osteoporosis: Secondary | ICD-10-CM

## 2023-12-04 DIAGNOSIS — Z1231 Encounter for screening mammogram for malignant neoplasm of breast: Secondary | ICD-10-CM

## 2023-12-04 NOTE — Progress Notes (Cosign Needed Addendum)
 Subjective:   Kristi Meyers is a 68 y.o. female who presents for an Initial Medicare Annual Wellness Visit.  Visit Complete: Virtual I connected with  Kristi Meyers on 12/04/23 by a audio enabled telemedicine application and verified that I am speaking with the correct person using two identifiers.  Patient Location: Home  Provider Location: Home Office  I discussed the limitations of evaluation and management by telemedicine. The patient expressed understanding and agreed to proceed.  Vital Signs: Because this visit was a virtual/telehealth visit, some criteria may be missing or patient reported. Any vitals not documented were not able to be obtained and vitals that have been documented are patient reported.  This patient declined Interactive audio and Acupuncturist. Therefore the visit was completed with audio only.   Cardiac Risk Factors include: advanced age (>73men, >22 women) (ellipical machine)     Objective:    Today's Vitals   12/04/23 1630  Weight: 195 lb (88.5 kg)  Height: 5\' 4"  (1.626 m)  PainSc: 0-No pain   Body mass index is 33.47 kg/m.     12/04/2023    4:33 PM 07/19/2022    1:35 AM 07/18/2022    4:25 PM 07/11/2022    3:52 PM 07/10/2022   12:19 PM 09/07/2020    5:59 AM 06/17/2020    9:27 PM  Advanced Directives  Does Patient Have a Medical Advance Directive? No No Yes Yes No No No  Type of Science writer of [place];Living will Healthcare Power of Attorney     Does patient want to make changes to medical advance directive?  No - Patient declined  No - Patient declined     Copy of Healthcare Power of Attorney in Chart?  No - copy requested  No - copy requested     Would patient like information on creating a medical advance directive? No - Patient declined No - Patient declined  No - Patient declined No - Patient declined No - Patient declined No - Patient declined    Current Medications  (verified) Outpatient Encounter Medications as of 12/04/2023  Medication Sig   acetaminophen (TYLENOL) 500 MG tablet Take 500 mg by mouth every 6 (six) hours as needed.   amLODipine (NORVASC) 5 MG tablet Take 5 mg by mouth daily.   atorvastatin (LIPITOR) 40 MG tablet Take 40 mg by mouth at bedtime.    carvedilol (COREG) 25 MG tablet Take 25 mg by mouth 2 (two) times daily with a meal.   Fezolinetant (VEOZAH) 45 MG TABS Take 1 tablet (45 mg total) by mouth daily.   furosemide (LASIX) 20 MG tablet Take 1 tablet (20 mg total) by mouth daily as needed.   iron polysaccharides (NIFEREX) 150 MG capsule Take 1 capsule (150 mg total) by mouth daily.   meclizine (ANTIVERT) 12.5 MG tablet Take 12.5 mg by mouth 2 (two) times daily.   nitroGLYCERIN (NITROSTAT) 0.4 MG SL tablet Place under the tongue.   ondansetron (ZOFRAN) 4 MG tablet Take 1 tablet (4 mg total) by mouth every 6 (six) hours as needed for nausea.   pantoprazole (PROTONIX) 40 MG tablet Take 1 tablet (40 mg total) by mouth daily.   PARoxetine (PAXIL) 10 MG tablet Take 1 tablet (10 mg total) by mouth daily. For hot flashes   polyethylene glycol (MIRALAX / GLYCOLAX) 17 g packet Take 17 g by mouth 2 (two) times daily.   senna-docusate (SENOKOT-S) 8.6-50 MG tablet Take 1 tablet  by mouth at bedtime.   warfarin (COUMADIN) 4 MG tablet Take 1 tablet (4 mg total) by mouth daily at 4 PM. 4 mg daily and follow up with Dr. Sharyn Lull within 1 week   No facility-administered encounter medications on file as of 12/04/2023.    Allergies (verified) Patient has no known allergies.   History: Past Medical History:  Diagnosis Date   Abnormal EKG    hx of left bundle branch block    Acute saddle pulmonary embolus (HCC) 12/10/2017   Anginal pain (HCC)    stable   GERD (gastroesophageal reflux disease)    Heart murmur    soft systolic per dr Sharyn Lull 08-11-2020 lov   High cholesterol    Hypertension    Left leg DVT (HCC) 11/2017   pulmonary embolus    Migraine    PMB (postmenopausal bleeding)    Vertigo    Past Surgical History:  Procedure Laterality Date   BIOPSY  07/17/2022   Procedure: BIOPSY;  Surgeon: Lemar Lofty., MD;  Location: [facility];  Service: Gastroenterology;;   Tustin Callas  2011   DILATATION & CURETTAGE/HYSTEROSCOPY WITH MYOSURE N/A 09/07/2020   Procedure: DILATATION & CURETTAGE/HYSTEROSCOPY WITH MYOSURE;  Surgeon: Romualdo Bolk, MD;  Location: [facility];  Service: Gynecology;  Laterality: N/A;   ESOPHAGOGASTRODUODENOSCOPY Left 07/17/2022   Procedure: ESOPHAGOGASTRODUODENOSCOPY (EGD);  Surgeon: Lemar Lofty., MD;  Location: [facility];  Service: Gastroenterology;  Laterality: Left;   IR ANGIOGRAM PULMONARY BILATERAL SELECTIVE  12/10/2017   IR ANGIOGRAM SELECTIVE EACH ADDITIONAL VESSEL  12/10/2017   IR ANGIOGRAM SELECTIVE EACH ADDITIONAL VESSEL  12/10/2017   IR INFUSION THROMBOL ARTERIAL INITIAL (MS)  12/10/2017   IR INFUSION THROMBOL ARTERIAL INITIAL (MS)  12/10/2017   IR THROMB F/U EVAL ART/VEN FINAL DAY (MS)  12/11/2017   IR US GUIDE VASC ACCESS RIGHT  12/10/2017   UPPER ESOPHAGEAL ENDOSCOPIC ULTRASOUND (EUS) N/A 07/17/2022   Procedure: UPPER ESOPHAGEAL ENDOSCOPIC ULTRASOUND (EUS);  Surgeon: Lemar Lofty., MD;  Location: [facility];  Service: Gastroenterology;  Laterality: N/A;   No family history on file. Social History   Socioeconomic History   Marital status: Single    Spouse name: Not on file   Number of children: Not on file   Years of education: Not on file   Highest education level: Not on file  Occupational History   Not on file  Tobacco Use   Smoking status: Former    Current packs/day: 0.00    Average packs/day: 0.3 packs/day for 20.0 years (5.0 ttl pk-yrs)    Types: Cigarettes    Start date: 10/16/1997    Quit date: 10/16/2017    Years since quitting: 6.1   Smokeless tobacco: Never  Vaping Use   Vaping status: Never Used  Substance and  Sexual Activity   Alcohol use: No   Drug use: Yes    Types: Marijuana    Comment: occ marijuana last used sept 2021   Sexual activity: Not on file  Other Topics Concern   Not on file  Social History Narrative   Not on file   Social Drivers of Health   Financial Resource Strain: Low Risk  (12/04/2023)   Overall Financial Resource Strain (CARDIA)    Difficulty of Paying Living Expenses: Not hard at all  Food Insecurity: No Food Insecurity (12/04/2023)   Hunger Vital Sign    Worried About Running Out of Food in the Last Year: Never true    Ran Out of Food  in the Last Year: Never true  Transportation Needs: No Transportation Needs (12/04/2023)   PRAPARE - Administrator, Civil Service (Medical): No    Lack of Transportation (Non-Medical): No  Physical Activity: Inactive (12/04/2023)   Exercise Vital Sign    Days of Exercise per Week: 0 days    Minutes of Exercise per Session: 0 min  Stress: No Stress Concern Present (12/04/2023)   [employer] of Occupational Health - Occupational Stress Questionnaire    Feeling of Stress : Not at all  Social Connections: Socially Integrated (12/04/2023)   Social Connection and Isolation Panel [NHANES]    Frequency of Communication with Friends and Family: More than three times a week    Frequency of Social Gatherings with Friends and Family: More than three times a week    Attends Religious Services: More than 4 times per year    Active Member of [employer] or Organizations: Yes    Attends Banker Meetings: Never    Marital Status: Married    Tobacco Counseling Counseling given: Not Answered   Clinical Intake:  Pre-visit preparation completed: Yes  Pain : No/denies pain Pain Score: 0-No pain     BMI - recorded: 33.47 Nutritional Status: BMI > 30  Obese Nutritional Risks: None Diabetes: No  How often do you need to have someone help you when you read instructions, pamphlets, or other written materials from your  doctor or pharmacy?: 1 - Never What is the last grade level you completed in school?: HSG  Interpreter Needed?: No  Information entered by :: Jader Desai N. Random Dobrowski, LPN.   Activities of Daily Living    12/04/2023    4:37 PM  In your present state of health, do you have any difficulty performing the following activities:  Hearing? 0  Vision? 0  Difficulty concentrating or making decisions? 0  Walking or climbing stairs? 1  Dressing or bathing? 0  Doing errands, shopping? 0  Preparing Food and eating ? N  Using the Toilet? N  In the past six months, have you accidently leaked urine? N  Do you have problems with loss of bowel control? N  Managing your Medications? N  Managing your Finances? N  Housekeeping or managing your Housekeeping? N    Patient Care Team: Hoy Register, MD as PCP - General (Family Medicine)  Indicate any recent Medical Services you may have received from other than [facility] providers in the past year (date may be approximate).     Assessment:   This is a routine wellness examination for [facility].  Hearing/Vision screen Hearing Screening - Comments:: Denies hearing difficulties.  Vision Screening - Comments:: Wears rx glasses - up to date with routine eye exams with [facility]    Goals Addressed             This Visit's Progress    Client understands the importance of follow-up with providers by attending scheduled visits        Depression Screen    12/04/2023    4:36 PM 09/25/2023   10:30 AM 08/24/2022    1:14 PM  PHQ 2/9 Scores  PHQ - 2 Score 0 5   PHQ- 9 Score 0 15   Exception Documentation   Patient refusal    Fall Risk    12/04/2023    4:34 PM 09/25/2023    9:07 AM 08/24/2022    1:14 PM  Fall Risk   Falls in the past year? 1 0 1  Number falls in past yr: 0 0 0  Injury with Fall? 0 0 1  Risk for fall due to : History of fall(s);Impaired balance/gait;Orthopedic patient No Fall Risks Other (Comment)  Follow up Falls  prevention discussed;Falls evaluation completed Falls evaluation completed Falls evaluation completed    MEDICARE RISK AT HOME: Medicare Risk at Home Any stairs in or around the home?: No If so, are there any without handrails?: No Home free of loose throw rugs in walkways, pet beds, electrical cords, etc?: Yes Adequate lighting in your home to reduce risk of falls?: Yes Life alert?: No (need one) Use of a cane, walker or w/c?: Yes Grab bars in the bathroom?: No (need in the bathroom) Shower chair or bench in shower?: No (need one) Elevated toilet seat or a handicapped toilet?: No  TIMED UP AND GO:  Was the test performed? No    Cognitive Function:    12/04/2023    4:36 PM  MMSE - Mini Mental State Exam  Not completed: Unable to complete        12/04/2023    4:36 PM  6CIT Screen  What Year? 0 points  What month? 0 points  What time? 0 points  Count back from 20 0 points  Months in reverse 0 points  Repeat phrase 0 points  Total Score 0 points    Immunizations Immunization History  Administered Date(s) Administered   Fluad Trivalent(High Dose 65+) 09/25/2023   [place] Dibbles (J&J) SARS-COV-2 Vaccination 12/27/2019   Moderna Sars-Covid-2 Vaccination 10/21/2020, 08/05/2021   PNEUMOCOCCAL CONJUGATE-20 09/25/2023    TDAP status: Due, Education has been provided regarding the importance of this vaccine. Advised may receive this vaccine at local pharmacy or Health Dept. Aware to provide a copy of the vaccination record if obtained from local pharmacy or Health Dept. Verbalized acceptance and understanding.  Flu Vaccine status: Up to date  Pneumococcal vaccine status: Up to date  Covid-19 vaccine status: Completed vaccines  Qualifies for Shingles Vaccine? Yes   Zostavax completed No   Shingrix Completed?: No.    Education has been provided regarding the importance of this vaccine. Patient has been advised to call insurance company to determine out of pocket expense if they  have not yet received this vaccine. Advised may also receive vaccine at local pharmacy or Health Dept. Verbalized acceptance and understanding.  Screening Tests Health Maintenance  Topic Date Due   DTaP/Tdap/Td (1 - Tdap) Never done   Colonoscopy  Never done   MAMMOGRAM  Never done   Zoster Vaccines- Shingrix (1 of 2) Never done   DEXA SCAN  Never done   COVID-19 Vaccine (4 - 2024-25 season) 06/17/2023   Medicare Annual Wellness (AWV)  12/03/2024   Pneumonia Vaccine 53+ Years old  Completed   INFLUENZA VACCINE  Completed   Hepatitis C Screening  Completed   HPV VACCINES  Aged Out    Health Maintenance  Health Maintenance Due  Topic Date Due   DTaP/Tdap/Td (1 - Tdap) Never done   Colonoscopy  Never done   MAMMOGRAM  Never done   Zoster Vaccines- Shingrix (1 of 2) Never done   DEXA SCAN  Never done   COVID-19 Vaccine (4 - 2024-25 season) 06/17/2023    Colorectal cancer screening: Referral to GI placed 12/04/2023. Pt aware the office will call re: appt.  Mammogram status: Ordered 12/04/2023. Pt provided with contact info and advised to call to schedule appt.   Bone Density status: Ordered 12/04/2023. Pt provided with contact  info and advised to call to schedule appt.  Lung Cancer Screening: (Low Dose CT Chest recommended if Age 84-80 years, 20 pack-year currently smoking OR have quit w/in 15years.) does not qualify.   Lung Cancer Screening Referral: NO  Additional Screening:  Hepatitis C Screening: does qualify; Completed 09/25/2023  Vision Screening: Recommended annual ophthalmology exams for early detection of glaucoma and other disorders of the eye. Is the patient up to date with their annual eye exam?  Yes  Who is the provider or what is the name of the office in which the patient attends annual eye exams? [facility] If pt is not established with a provider, would they like to be referred to a provider to establish care? No .   Dental Screening: Recommended annual  dental exams for proper oral hygiene   Community Resource Referral / Chronic Care Management: CRR required this visit?  No   CCM required this visit?  No     Plan:     I have personally reviewed and noted the following in the patient's chart:   Medical and social history Use of alcohol, tobacco or illicit drugs  Current medications and supplements including opioid prescriptions. Patient is not currently taking opioid prescriptions. Functional ability and status Nutritional status Physical activity Advanced directives List of other physicians Hospitalizations, surgeries, and ER visits in previous 12 months Vitals Screenings to include cognitive, depression, and falls Referrals and appointments  In addition, I have reviewed and discussed with patient certain preventive protocols, quality metrics, and best practice recommendations. A written personalized care plan for preventive services as well as general preventive health recommendations were provided to patient.     Mickeal Needy, LPN   9/60/4540   After Visit Summary: (Declined) Due to this being a telephonic visit, with patients personalized plan was offered to patient but patient Declined AVS at this time   Nurse Notes: This nurse placed the following referrals: Colonoscopy, Mammogram and Bone Density.  This patient is due for a Tdap and Shingrix vaccine.

## 2023-12-04 NOTE — Patient Instructions (Addendum)
 Kristi Meyers , Thank you for taking time to come for your Medicare Wellness Visit. I appreciate your ongoing commitment to your health goals. Please review the following plan we discussed and let me know if I can assist you in the future.   Referrals/Orders/Follow-Ups/Clinician Recommendations: Yes; Keep maintaining your health by keeping your appointments with Dr. Alvis Lemmings and any specialists that you may see.  Call us if you need anything.  Have a great year!!!!  This is a list of the screening recommended for you and due dates:  Health Maintenance  Topic Date Due   DTaP/Tdap/Td vaccine (1 - Tdap) Never done   Colon Cancer Screening  Never done   Mammogram  Never done   Zoster (Shingles) Vaccine (1 of 2) Never done   DEXA scan (bone density measurement)  Never done   COVID-19 Vaccine (4 - 2024-25 season) 06/17/2023   Medicare Annual Wellness Visit  12/03/2024   Pneumonia Vaccine  Completed   Flu Shot  Completed   Hepatitis C Screening  Completed   HPV Vaccine  Aged Out    Advanced directives: (Declined) Advance directive discussed with you today. Even though you declined this today, please call our office should you change your mind, and we can give you the proper paperwork for you to fill out.  Next Medicare Annual Wellness Visit scheduled for next year: Yes

## 2023-12-05 ENCOUNTER — Ambulatory Visit: Payer: Medicare HMO | Admitting: Family

## 2023-12-11 ENCOUNTER — Ambulatory Visit: Payer: Medicare HMO | Admitting: Family Medicine

## 2023-12-12 ENCOUNTER — Ambulatory Visit (INDEPENDENT_AMBULATORY_CARE_PROVIDER_SITE_OTHER): Payer: Medicare HMO | Admitting: Family

## 2023-12-12 DIAGNOSIS — M1711 Unilateral primary osteoarthritis, right knee: Secondary | ICD-10-CM

## 2023-12-12 NOTE — Progress Notes (Signed)
 Office Visit Note   Patient: Kristi Meyers           Date of Birth: 11/14/1955           MRN: [id number] Visit Date: 12/12/2023              Requested by: Hoy Register, MD [address] [place] [address] [place],  [place] [address] PCP: Hoy Register, MD  Chief Complaint  Patient presents with   Right Knee - Follow-up    Monovisc injection      HPI: The patient is a 68 year old woman who presents today for planned Monovisc injection  Assessment & Plan: Visit Diagnoses: No diagnosis found.  Plan: Monovisc injection right knee.  Patient tolerated well.  She will follow-up as needed.  Follow-Up Instructions: No follow-ups on file.   Ortho Exam  Patient is alert, oriented, no adenopathy, well-dressed, normal affect, normal respiratory effort.   Imaging: No results found. No images are attached to the encounter.  Labs: Lab Results  Component Value Date   HGBA1C 5.7 (H) 09/25/2023   REPTSTATUS 07/22/2022 FINAL 07/18/2022   CULT 20,000 COLONIES/mL ENTEROCOCCUS AVIUM (A) 07/18/2022   LABORGA ENTEROCOCCUS AVIUM (A) 07/18/2022     Lab Results  Component Value Date   ALBUMIN 3.8 (L) 09/25/2023   ALBUMIN 3.8 08/24/2022   ALBUMIN 2.7 (L) 07/21/2022    Lab Results  Component Value Date   MG 1.9 08/24/2022   MG 1.6 (L) 07/21/2022   MG 2.1 07/20/2022   No results found for: "VD25OH"  No results found for: "PREALBUMIN"    Latest Ref Rng & Units 09/25/2023    9:49 AM 08/24/2022    1:52 PM 07/21/2022    8:50 AM  CBC EXTENDED  WBC 3.4 - 10.8 x10E3/uL 7.8  9.4  11.8   RBC 3.77 - 5.28 x10E6/uL 4.19  3.82  2.96   Hemoglobin 11.1 - 15.9 g/dL 52.8  41.3  8.0   HCT 24.4 - 46.6 % 36.6  31.5  25.7   Platelets 150 - 450 x10E3/uL 275  302.0  276   NEUT# 1.4 - 7.0 x10E3/uL 4.6  5.7  7.7   Lymph# 0.7 - 3.1 x10E3/uL 2.0  2.6  2.7      There is no height or weight on file to calculate BMI.  Orders:  No orders of the defined types were placed in this encounter.  No  orders of the defined types were placed in this encounter.    Procedures: Large Joint Inj: R knee on 12/12/2023 3:28 PM Indications: pain Details: 18 G 1.5 in needle, anteromedial approach Medications: 5 mL lidocaine 1 %; 40 mg methylPREDNISolone acetate 40 MG/ML Consent was given by the patient.     Clinical Data: No additional findings.  ROS:  All other systems negative, except as noted in the HPI. Review of Systems  Objective: Vital Signs: LMP 11/17/2008 (Approximate)   Specialty Comments:  No specialty comments available.  PMFS History: Patient Active Problem List   Diagnosis Date Noted   Depression 09/25/2023   Impaired ambulation 07/18/2022   ABLA (acute blood loss anemia)    Abnormal CT scan, stomach    Epigastric pain    Long term (current) use of anticoagulants - 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 Could not afford Eliquis. 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   Postmenopausal bleeding 07/01/2020   Fibroid 07/01/2020  History of pulmonary embolus (PE) - Had saddle PE in 2019 requiring EKOS 07/01/2020   Primary localized osteoarthritis of right knee 03/14/2019   Abdominal aortic aneurysm (AAA) 3.0 cm to 5.0 cm in diameter in female [facility]) 02/18/2018   AKI (acute kidney injury) (HCC) 12/15/2017   Past Medical History:  Diagnosis Date   Abnormal EKG    hx of left bundle branch block    Acute saddle pulmonary embolus (HCC) 12/10/2017   Anginal pain (HCC)    stable   GERD (gastroesophageal reflux disease)    Heart murmur    soft systolic per dr Sharyn Lull 08-11-2020 lov   High cholesterol    Hypertension    Left leg DVT (HCC) 11/2017   pulmonary embolus   Migraine    PMB (postmenopausal bleeding)    Vertigo     No family history on file.  Past Surgical History:  Procedure Laterality Date   BIOPSY  07/17/2022   Procedure: BIOPSY;  Surgeon:  Meridee Score Netty Starring., MD;  Location: [facility];  Service: Gastroenterology;;   CHOLECYSTECTOMY  2011   DILATATION & CURETTAGE/HYSTEROSCOPY WITH MYOSURE N/A 09/07/2020   Procedure: DILATATION & CURETTAGE/HYSTEROSCOPY WITH MYOSURE;  Surgeon: Romualdo Bolk, MD;  Location: [facility];  Service: Gynecology;  Laterality: N/A;   ESOPHAGOGASTRODUODENOSCOPY Left 07/17/2022   Procedure: ESOPHAGOGASTRODUODENOSCOPY (EGD);  Surgeon: Lemar Lofty., MD;  Location: [facility];  Service: Gastroenterology;  Laterality: Left;   IR ANGIOGRAM PULMONARY BILATERAL SELECTIVE  12/10/2017   IR ANGIOGRAM SELECTIVE EACH ADDITIONAL VESSEL  12/10/2017   IR ANGIOGRAM SELECTIVE EACH ADDITIONAL VESSEL  12/10/2017   IR INFUSION THROMBOL ARTERIAL INITIAL (MS)  12/10/2017   IR INFUSION THROMBOL ARTERIAL INITIAL (MS)  12/10/2017   IR THROMB F/U EVAL ART/VEN FINAL DAY (MS)  12/11/2017   IR US GUIDE VASC ACCESS RIGHT  12/10/2017   UPPER ESOPHAGEAL ENDOSCOPIC ULTRASOUND (EUS) N/A 07/17/2022   Procedure: UPPER ESOPHAGEAL ENDOSCOPIC ULTRASOUND (EUS);  Surgeon: Lemar Lofty., MD;  Location: [facility];  Service: Gastroenterology;  Laterality: N/A;   Social History   Occupational History   Not on file  Tobacco Use   Smoking status: Former    Current packs/day: 0.00    Average packs/day: 0.3 packs/day for 20.0 years (5.0 ttl pk-yrs)    Types: Cigarettes    Start date: 10/16/1997    Quit date: 10/16/2017    Years since quitting: 6.1   Smokeless tobacco: Never  Vaping Use   Vaping status: Never Used  Substance and Sexual Activity   Alcohol use: No   Drug use: Yes    Types: Marijuana    Comment: occ marijuana last used sept 2021   Sexual activity: Not on file

## 2023-12-18 MED ORDER — LIDOCAINE HCL 1 % IJ SOLN
5.0000 mL | INTRAMUSCULAR | Status: AC | PRN
  Administered 2023-12-12: 5 mL

## 2023-12-18 MED ORDER — METHYLPREDNISOLONE ACETATE 40 MG/ML IJ SUSP
40.0000 mg | INTRAMUSCULAR | Status: AC | PRN
  Administered 2023-12-12: 40 mg via INTRA_ARTICULAR

## 2023-12-19 ENCOUNTER — Other Ambulatory Visit (HOSPITAL_BASED_OUTPATIENT_CLINIC_OR_DEPARTMENT_OTHER): Payer: Self-pay

## 2023-12-24 ENCOUNTER — Ambulatory Visit: Payer: Medicare HMO | Attending: Family Medicine | Admitting: Family Medicine

## 2023-12-24 ENCOUNTER — Encounter: Payer: Self-pay | Admitting: Family Medicine

## 2023-12-24 ENCOUNTER — Telehealth: Payer: Self-pay | Admitting: Family Medicine

## 2023-12-24 VITALS — BP 128/76 | HR 78 | Ht 64.0 in

## 2023-12-24 DIAGNOSIS — Z1211 Encounter for screening for malignant neoplasm of colon: Secondary | ICD-10-CM | POA: Diagnosis not present

## 2023-12-24 DIAGNOSIS — I714 Abdominal aortic aneurysm, without rupture, unspecified: Secondary | ICD-10-CM

## 2023-12-24 DIAGNOSIS — Z86711 Personal history of pulmonary embolism: Secondary | ICD-10-CM

## 2023-12-24 DIAGNOSIS — I10 Essential (primary) hypertension: Secondary | ICD-10-CM | POA: Diagnosis not present

## 2023-12-24 DIAGNOSIS — M170 Bilateral primary osteoarthritis of knee: Secondary | ICD-10-CM

## 2023-12-24 DIAGNOSIS — M549 Dorsalgia, unspecified: Secondary | ICD-10-CM

## 2023-12-24 DIAGNOSIS — N951 Menopausal and female climacteric states: Secondary | ICD-10-CM

## 2023-12-24 MED ORDER — LIDOCAINE 5 % EX PTCH: 1.0000 | MEDICATED_PATCH | CUTANEOUS | 6 refills | Status: AC

## 2023-12-24 NOTE — Progress Notes (Signed)
 Subjective:  Patient ID: Kristi Meyers, female    DOB: 1956-09-28  Age: 68 y.o. MRN: [id number]  CC: Medical Management of Chronic Issues (Back pain/Hot flashes)     Discussed the use of AI scribe software for clinical note transcription with the patient, who gave verbal consent to proceed.  History of Present Illness The patient, with a history of hypertension, hyperlipidemia, history of recurrent DVT and PE, right knee OA, abdominal aortic aneurysm, presents with hot flashes.  The prescribed medication, Allyne Gee, was not covered by her insurance. The patient was then prescribed paroxetine, which she initially misunderstood as a diuretic and has not been taking.  The patient also reports lower back pain that started within the last year. The pain is described as intermittent and more pronounced upon getting out of bed but does not radiate. Over-the-counter Tylenol provides some relief, but the patient reports that it is not consistently effective. The patient also mentions a need for a knee replacement due to weakness in the legs.  She is on chronic anticoagulation with Eliquis due to her recurrent DVT and PE.  Endorses adherence with her antihypertensive and her statin.  Past Medical History:  Diagnosis Date   Abnormal EKG    hx of left bundle branch block    Acute saddle pulmonary embolus (HCC) 12/10/2017   Anginal pain (HCC)    stable   GERD (gastroesophageal reflux disease)    Heart murmur    soft systolic per dr Sharyn Lull 08-11-2020 lov   High cholesterol    Hypertension    Left leg DVT (HCC) 11/2017   pulmonary embolus   Migraine    PMB (postmenopausal bleeding)    Vertigo     Past Surgical History:  Procedure Laterality Date   BIOPSY  07/17/2022   Procedure: BIOPSY;  Surgeon: Lemar Lofty., MD;  Location: [facility];  Service: Gastroenterology;;   [place]  2011   DILATATION & CURETTAGE/HYSTEROSCOPY WITH MYOSURE N/A 09/07/2020   Procedure: DILATATION  & CURETTAGE/HYSTEROSCOPY WITH MYOSURE;  Surgeon: Romualdo Bolk, MD;  Location: [facility];  Service: Gynecology;  Laterality: N/A;   ESOPHAGOGASTRODUODENOSCOPY Left 07/17/2022   Procedure: ESOPHAGOGASTRODUODENOSCOPY (EGD);  Surgeon: Lemar Lofty., MD;  Location: [facility];  Service: Gastroenterology;  Laterality: Left;   IR ANGIOGRAM PULMONARY BILATERAL SELECTIVE  12/10/2017   IR ANGIOGRAM SELECTIVE EACH ADDITIONAL VESSEL  12/10/2017   IR ANGIOGRAM SELECTIVE EACH ADDITIONAL VESSEL  12/10/2017   IR INFUSION THROMBOL ARTERIAL INITIAL (MS)  12/10/2017   IR INFUSION THROMBOL ARTERIAL INITIAL (MS)  12/10/2017   IR THROMB F/U EVAL ART/VEN FINAL DAY (MS)  12/11/2017   IR US GUIDE VASC ACCESS RIGHT  12/10/2017   UPPER ESOPHAGEAL ENDOSCOPIC ULTRASOUND (EUS) N/A 07/17/2022   Procedure: UPPER ESOPHAGEAL ENDOSCOPIC ULTRASOUND (EUS);  Surgeon: Lemar Lofty., MD;  Location: [facility];  Service: Gastroenterology;  Laterality: N/A;    No family history on file.  Social History   Socioeconomic History   Marital status: Single    Spouse name: Not on file   Number of children: Not on file   Years of education: Not on file   Highest education level: Not on file  Occupational History   Not on file  Tobacco Use   Smoking status: Former    Current packs/day: 0.00    Average packs/day: 0.3 packs/day for 20.0 years (5.0 ttl pk-yrs)    Types: Cigarettes    Start date: 10/16/1997    Quit date: 10/16/2017  Years since quitting: 6.1   Smokeless tobacco: Never  Vaping Use   Vaping status: Never Used  Substance and Sexual Activity   Alcohol use: No   Drug use: Yes    Types: Marijuana    Comment: occ marijuana last used sept 2021   Sexual activity: Not on file  Other Topics Concern   Not on file  Social History Narrative   Not on file   Social Drivers of Health   Financial Resource Strain: Low Risk  (12/04/2023)   Overall Financial Resource Strain (CARDIA)     Difficulty of Paying Living Expenses: Not hard at all  Food Insecurity: No Food Insecurity (12/04/2023)   Hunger Vital Sign    Worried About Running Out of Food in the Last Year: Never true    Ran Out of Food in the Last Year: Never true  Transportation Needs: No Transportation Needs (12/04/2023)   PRAPARE - Administrator, Civil Service (Medical): No    Lack of Transportation (Non-Medical): No  Physical Activity: Inactive (12/04/2023)   Exercise Vital Sign    Days of Exercise per Week: 0 days    Minutes of Exercise per Session: 0 min  Stress: No Stress Concern Present (12/04/2023)   [employer] of Occupational Health - Occupational Stress Questionnaire    Feeling of Stress : Not at all  Social Connections: Socially Integrated (12/04/2023)   Social Connection and Isolation Panel [NHANES]    Frequency of Communication with Friends and Family: More than three times a week    Frequency of Social Gatherings with Friends and Family: More than three times a week    Attends Religious Services: More than 4 times per year    Active Member of [employer] or Organizations: Yes    Attends Banker Meetings: Never    Marital Status: Married    No Known Allergies  Outpatient Medications Prior to Visit  Medication Sig Dispense Refill   acetaminophen (TYLENOL) 500 MG tablet Take 500 mg by mouth every 6 (six) hours as needed.     amLODipine (NORVASC) 5 MG tablet Take 5 mg by mouth daily.     atorvastatin (LIPITOR) 40 MG tablet Take 40 mg by mouth at bedtime.      carvedilol (COREG) 25 MG tablet Take 25 mg by mouth 2 (two) times daily with a meal.     Fezolinetant (VEOZAH) 45 MG TABS Take 1 tablet (45 mg total) by mouth daily. 90 tablet 1   furosemide (LASIX) 20 MG tablet Take 1 tablet (20 mg total) by mouth daily as needed. 30 tablet 1   iron polysaccharides (NIFEREX) 150 MG capsule Take 1 capsule (150 mg total) by mouth daily. 30 capsule 0   meclizine (ANTIVERT) 12.5 MG tablet  Take 12.5 mg by mouth 2 (two) times daily.     nitroGLYCERIN (NITROSTAT) 0.4 MG SL tablet Place under the tongue.     ondansetron (ZOFRAN) 4 MG tablet Take 1 tablet (4 mg total) by mouth every 6 (six) hours as needed for nausea. 20 tablet 0   PARoxetine (PAXIL) 10 MG tablet Take 1 tablet (10 mg total) by mouth daily. For hot flashes 30 tablet 1   polyethylene glycol (MIRALAX / GLYCOLAX) 17 g packet Take 17 g by mouth 2 (two) times daily. 14 each 0   senna-docusate (SENOKOT-S) 8.6-50 MG tablet Take 1 tablet by mouth at bedtime.     warfarin (COUMADIN) 4 MG tablet Take 1 tablet (4 mg total)  by mouth daily at 4 PM. 4 mg daily and follow up with Dr. Sharyn Lull within 1 week 30 tablet 0   pantoprazole (PROTONIX) 40 MG tablet Take 1 tablet (40 mg total) by mouth daily. 30 tablet 0   No facility-administered medications prior to visit.     ROS Review of Systems  Constitutional:  Negative for activity change, appetite change and fatigue.  HENT:  Negative for congestion, sinus pressure and sore throat.   Eyes:  Negative for visual disturbance.  Respiratory:  Negative for cough, chest tightness, shortness of breath and wheezing.   Cardiovascular:  Negative for chest pain and palpitations.  Gastrointestinal:  Negative for abdominal distention, abdominal pain and constipation.  Endocrine: Negative for polydipsia.  Genitourinary:  Negative for dysuria and frequency.  Musculoskeletal:        See HPI  Skin:  Negative for rash.  Neurological:  Negative for tremors, light-headedness and numbness.  Hematological:  Does not bruise/bleed easily.  Psychiatric/Behavioral:  Negative for agitation and behavioral problems.     Objective:  BP 128/76   Pulse 78   Ht 5\' 4"  (1.626 m)   LMP 11/17/2008 (Approximate)   SpO2 97%   BMI 33.47 kg/m      12/24/2023    1:41 PM 12/04/2023    4:30 PM 09/25/2023    9:06 AM  BP/Weight  Systolic BP 128  161  Diastolic BP 76  76  Wt. (Lbs)  195   BMI  33.47 kg/m2        Physical Exam Constitutional:      Appearance: She is well-developed.     Comments: Sitting in a wheelchair  Cardiovascular:     Rate and Rhythm: Normal rate.     Heart sounds: Normal heart sounds. No murmur heard. Pulmonary:     Effort: Pulmonary effort is normal.     Breath sounds: Normal breath sounds. No wheezing or rales.  Chest:     Chest wall: No tenderness.  Abdominal:     General: Bowel sounds are normal. There is no distension.     Palpations: Abdomen is soft. There is no mass.     Tenderness: There is no abdominal tenderness.  Musculoskeletal:     Right lower leg: No edema.     Left lower leg: No edema.     Comments: Tenderness on range of motion of both knees Slight tenderness on palpation of lumbar region  Neurological:     Mental Status: She is alert and oriented to person, place, and time.  Psychiatric:        Mood and Affect: Mood normal.        Latest Ref Rng & Units 09/25/2023    9:49 AM 08/24/2022    1:52 PM 07/21/2022    8:50 AM  CMP  Glucose 70 - 99 mg/dL 91  096  045   BUN 8 - 27 mg/dL 16  12  15    Creatinine 0.57 - 1.00 mg/dL 4.09  8.11  9.14   Sodium 134 - 144 mmol/L 144  144  143   Potassium 3.5 - 5.2 mmol/L 4.3  4.0  3.3   Chloride 96 - 106 mmol/L 109  108  111   CO2 20 - 29 mmol/L 24  30  23    Calcium 8.7 - 10.3 mg/dL 9.2  9.5  7.8   Total Protein 6.0 - 8.5 g/dL 6.6  7.2  6.0   Total Bilirubin 0.0 - 1.2 mg/dL 0.5  0.5  0.7   Alkaline Phos 44 - 121 IU/L 90  72  50   AST 0 - 40 IU/L 15  13  14    ALT 0 - 32 IU/L 9  9  16      Lipid Panel     Component Value Date/Time   CHOL 125 09/25/2023 0949   TRIG 70 09/25/2023 0949   HDL 36 (L) 09/25/2023 0949   LDLCALC 75 09/25/2023 0949    CBC    Component Value Date/Time   WBC 7.8 09/25/2023 0949   WBC 9.4 08/24/2022 1352   RBC 4.19 09/25/2023 0949   RBC 3.82 (L) 08/24/2022 1352   HGB 11.1 09/25/2023 0949   HCT 36.6 09/25/2023 0949   PLT 275 09/25/2023 0949   MCV 87 09/25/2023  0949   MCH 26.5 (L) 09/25/2023 0949   MCH 27.0 07/21/2022 0850   MCHC 30.3 (L) 09/25/2023 0949   MCHC 32.0 08/24/2022 1352   RDW 16.3 (H) 09/25/2023 0949   LYMPHSABS 2.0 09/25/2023 0949   MONOABS 0.8 08/24/2022 1352   EOSABS 0.5 (H) 09/25/2023 0949   BASOSABS 0.1 09/25/2023 0949    Lab Results  Component Value Date   HGBA1C 5.7 (H) 09/25/2023       Assessment & Plan Vasomotor menopausal symptoms Experiencing hot flashes. Paroxetine prescribed as an alternative to [facility], which was not covered by insurance. Insurance denial for Allyne Gee will be revisited. - Encourage starting paroxetine for hot flashes. - Speak with pharmacist to attempt another prior authorization for [facility] given she is not a candidate for HRT due to recurrent PE and DVT.  Chronic lower back pain Chronic lower back pain managed with Tylenol. On anticoagulants, limiting NSAID use. Lidoderm patch and physical therapy considered safe alternatives. - Continue Tylenol as needed for pain. - Prescribe Lidoderm patch for additional pain relief. - Recommend use of a heating pad. - Refer to physical therapy for back exercises and possible electrical stimulation. - Consider referral to a back specialist if physical therapy is ineffective.  Osteoarthritis of the knees Reports weakness in the legs and difficulty walking, requiring a walker. Mentions needing a knee replacement. -Follow-up with orthopedic  Hypertension Taking amlodipine and carvedilol with refills available. -Counseled on blood pressure goal of less than 130/80, low-sodium, DASH diet, medication compliance, 150 minutes of moderate intensity exercise per week. Discussed medication compliance, adverse effects.   Hyperlipidemia On atorvastatin for cholesterol management.  Pulmonary embolism and DVT On indefinite anticoagulation therapy.   Abdominal aortic aneurysm Measuring 3.6 cm in CT abdomen and pelvis from 06/2022, follow-up aortic ultrasound in 2  years recommended which will be in 06/2024 Will order at next visit.  Preventive care Due for a mammogram and colon cancer screening. Prefers Cologuard stool test over colonoscopy. - Mammogram and bone density orders placed previously and advised her to schedule it. - Order Cologuard for colon cancer screening.  Follow-up Advised to return for a follow-up visit in six months unless issues arise sooner. - Schedule follow-up visit in six months.      Meds ordered this encounter  Medications   lidocaine (LIDODERM) 5 %    Sig: Place 1 patch onto the skin daily. Remove & Discard patch within 12 hours or as directed by MD    Dispense:  30 patch    Refill:  6    Follow-up: Return in about 6 months (around 06/25/2024) for Chronic medical conditions.       Hoy Register, MD, FAAFP. [facility]  [facility] [place], [place] [phone]   12/24/2023, 3:49 PM

## 2023-12-24 NOTE — Telephone Encounter (Signed)
 I previously prescribed Veozah for this patient for hot flashes in 09/2023 but was informed insurance denied this.  HRT is contraindicated due to her history of recurrent DVT and PE.  Would be make an exemption for her?  Thank you.

## 2023-12-24 NOTE — Patient Instructions (Signed)
 VISIT SUMMARY:  During today's visit, we discussed your ongoing issues with hot flashes, lower back pain, and knee pain. We also reviewed your current medications and preventive care needs.  YOUR PLAN:  -HOT FLASHES: Hot flashes are sudden feelings of warmth, often associated with menopause. We have prescribed paroxetine to help manage your hot flashes since your insurance did not cover Veozah. Please start taking paroxetine as directed. We will also speak with your pharmacist to try to get Veozah covered by your insurance.  -CHRONIC LOWER BACK PAIN: Chronic lower back pain is persistent pain in the lower back area. You should continue taking Tylenol as needed. We have also prescribed a Lidoderm patch for additional pain relief and recommend using a heating pad. You will be referred to physical therapy for exercises and possible electrical stimulation. If physical therapy does not help, we may refer you to a back specialist.  -OSTEOARTHRITIS OF THE KNEES: Osteoarthritis is a condition that causes the joints to become painful and stiff. You mentioned needing a knee replacement due to weakness in your legs. We will continue to monitor this condition.  -HYPERTENSION: Hypertension is high blood pressure. You are currently taking amlodipine and carvedilol, and you have refills available.  -HYPERLIPIDEMIA: Hyperlipidemia is high cholesterol. You are managing this condition with atorvastatin.  -PULMONARY EMBOLISM AND DVT: Pulmonary embolism and deep vein thrombosis (DVT) are conditions where blood clots form in the lungs and veins. You are on indefinite anticoagulation therapy to prevent further clots.  -PREVENTIVE CARE: Preventive care includes routine screenings to detect potential health issues early. You are due for a mammogram and colon cancer screening. We will place an order for your mammogram and have ordered the Cologuard stool test for colon cancer screening, as you prefer it over a  colonoscopy.  INSTRUCTIONS:  Please schedule your mammogram and complete the Cologuard stool test for colon cancer screening. Return for a follow-up visit in six months unless any issues arise sooner.

## 2023-12-31 ENCOUNTER — Other Ambulatory Visit: Payer: Self-pay

## 2024-01-01 ENCOUNTER — Telehealth: Payer: Self-pay

## 2024-01-01 ENCOUNTER — Other Ambulatory Visit: Payer: Self-pay

## 2024-01-01 NOTE — Telephone Encounter (Signed)
 Pharmacy Patient Advocate Encounter  Received notification from [facility] that Prior Authorization for [facility] has been APPROVED from 10/17/2023 to 10/15/2024   PA #/Case ID/Reference #: [id number]  [facility] processed prescription today with $0 copay. Unable to reach patient, patient's mailbox full.

## 2024-01-02 NOTE — Telephone Encounter (Signed)
 Can you please inform her that her Busa for hot flushes was approved?  Thank you.

## 2024-02-11 ENCOUNTER — Encounter: Payer: Self-pay | Admitting: Gastroenterology

## 2024-04-04 ENCOUNTER — Ambulatory Visit: Admitting: Gastroenterology

## 2024-04-22 DIAGNOSIS — Z7901 Long term (current) use of anticoagulants: Secondary | ICD-10-CM | POA: Diagnosis not present

## 2024-04-25 ENCOUNTER — Telehealth: Payer: Self-pay | Admitting: Orthopedic Surgery

## 2024-04-25 NOTE — Telephone Encounter (Signed)
 Pt's daughter called to submit to insurance company for gel injection right knee. Phone number is [phone].

## 2024-04-29 NOTE — Telephone Encounter (Signed)
 Next available gel injection would need to be after 06/10/2024.  VOB submitted for Monovisc, right knee.

## 2024-04-29 NOTE — Telephone Encounter (Signed)
 Tried calling patient to advise of message below, but no answer and not able to leave a VM due to mailbox being full.

## 2024-05-16 DIAGNOSIS — Z7901 Long term (current) use of anticoagulants: Secondary | ICD-10-CM | POA: Diagnosis not present

## 2024-05-20 ENCOUNTER — Ambulatory Visit: Admitting: Family

## 2024-05-20 ENCOUNTER — Encounter: Payer: Self-pay | Admitting: Family

## 2024-05-20 DIAGNOSIS — M1711 Unilateral primary osteoarthritis, right knee: Secondary | ICD-10-CM

## 2024-05-20 MED ORDER — HYALURONAN 88 MG/4ML IX SOSY
88.0000 mg | PREFILLED_SYRINGE | INTRA_ARTICULAR | Status: AC | PRN
  Administered 2024-05-20: 88 mg via INTRA_ARTICULAR

## 2024-05-20 MED ORDER — LIDOCAINE HCL 1 % IJ SOLN
1.0000 mL | INTRAMUSCULAR | Status: AC | PRN
  Administered 2024-05-20: 1 mL

## 2024-05-20 NOTE — Progress Notes (Signed)
 Office Visit Note   Patient: Kristi Meyers           Date of Birth: 02/15/1956           MRN: [id number] Visit Date: 05/20/2024              Requested by: Delbert Clam, MD [address] [place] [address] [place],  [place] [address] PCP: Delbert Clam, MD  Chief Complaint  Patient presents with   Right Knee - Follow-up    Monovisc inj      HPI: The patient is a 68 year old woman seen today for planned monovisc injection.  Assessment & Plan: Visit Diagnoses: No diagnosis found.  Plan: monovisc right knee. Patient tolerated well.  Follow-Up Instructions: Return if symptoms worsen or fail to improve.   Ortho Exam  Patient is alert, oriented, no adenopathy, well-dressed, normal affect, normal respiratory effort.    Imaging: No results found. No images are attached to the encounter.  Labs: Lab Results  Component Value Date   HGBA1C 5.7 (H) 09/25/2023   REPTSTATUS 07/22/2022 FINAL 07/18/2022   CULT 20,000 COLONIES/mL ENTEROCOCCUS AVIUM (A) 07/18/2022   LABORGA ENTEROCOCCUS AVIUM (A) 07/18/2022     Lab Results  Component Value Date   ALBUMIN  3.8 (L) 09/25/2023   ALBUMIN  3.8 08/24/2022   ALBUMIN  2.7 (L) 07/21/2022    Lab Results  Component Value Date   MG 1.9 08/24/2022   MG 1.6 (L) 07/21/2022   MG 2.1 07/20/2022   No results found for: VD25OH  No results found for: PREALBUMIN    Latest Ref Rng & Units 09/25/2023    9:49 AM 08/24/2022    1:52 PM 07/21/2022    8:50 AM  CBC EXTENDED  WBC 3.4 - 10.8 x10E3/uL 7.8  9.4  11.8   RBC 3.77 - 5.28 x10E6/uL 4.19  3.82  2.96   Hemoglobin 11.1 - 15.9 g/dL 88.8  89.8  8.0   HCT 65.9 - 46.6 % 36.6  31.5  25.7   Platelets 150 - 450 x10E3/uL 275  302.0  276   NEUT# 1.4 - 7.0 x10E3/uL 4.6  5.7  7.7   Lymph# 0.7 - 3.1 x10E3/uL 2.0  2.6  2.7      There is no height or weight on file to calculate BMI.  Orders:  No orders of the defined types were placed in this encounter.  No orders of the defined types  were placed in this encounter.    Procedures: Large Joint Inj on 05/20/2024 10:45 AM Indications: pain Details: 18 G 1.5 in needle, anteromedial approach Medications: 1 mL lidocaine  1 %; 88 mg Hyaluronan 88 MG/4ML Consent was given by the patient.      Clinical Data: No additional findings.  ROS:  All other systems negative, except as noted in the HPI. Review of Systems  Objective: Vital Signs: LMP 11/17/2008 (Approximate)   Specialty Comments:  No specialty comments available.  PMFS History: Patient Active Problem List   Diagnosis Date Noted   Depression 09/25/2023   Impaired ambulation 07/18/2022   ABLA (acute blood loss anemia)    Abnormal CT scan, stomach    Epigastric pain    Long term (current) use of anticoagulants - Remains on coumadin  for hx of saddle PE. Could not afford Eliquis . 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   Postmenopausal bleeding 07/01/2020   Fibroid 07/01/2020   History of  pulmonary embolus (PE) - Had saddle PE in 2019 requiring EKOS 07/01/2020   Primary localized osteoarthritis of right knee 03/14/2019   Abdominal aortic aneurysm (AAA) 3.0 cm to 5.0 cm in diameter in female [facility]) 02/18/2018   AKI (acute kidney injury) (HCC) 12/15/2017   Past Medical History:  Diagnosis Date   Abnormal EKG    hx of left bundle branch block    Acute saddle pulmonary embolus (HCC) 12/10/2017   Anginal pain (HCC)    stable   GERD (gastroesophageal reflux disease)    Heart murmur    soft systolic per dr levern 08-11-2020 lov   High cholesterol    Hypertension    Left leg DVT (HCC) 11/2017   pulmonary embolus   Migraine    PMB (postmenopausal bleeding)    Vertigo     History reviewed. No pertinent family history.  Past Surgical History:  Procedure Laterality Date   BIOPSY  07/17/2022   Procedure: BIOPSY;  Surgeon: Wilhelmenia Aloha Raddle., MD;   Location: [facility];  Service: Gastroenterology;;   CHOLECYSTECTOMY  2011   DILATATION & CURETTAGE/HYSTEROSCOPY WITH MYOSURE N/A 09/07/2020   Procedure: DILATATION & CURETTAGE/HYSTEROSCOPY WITH MYOSURE;  Surgeon: Jannis Kate Norris, MD;  Location: [facility];  Service: Gynecology;  Laterality: N/A;   ESOPHAGOGASTRODUODENOSCOPY Left 07/17/2022   Procedure: ESOPHAGOGASTRODUODENOSCOPY (EGD);  Surgeon: Wilhelmenia Aloha Raddle., MD;  Location: [facility];  Service: Gastroenterology;  Laterality: Left;   IR ANGIOGRAM PULMONARY BILATERAL SELECTIVE  12/10/2017   IR ANGIOGRAM SELECTIVE EACH ADDITIONAL VESSEL  12/10/2017   IR ANGIOGRAM SELECTIVE EACH ADDITIONAL VESSEL  12/10/2017   IR INFUSION THROMBOL ARTERIAL INITIAL (MS)  12/10/2017   IR INFUSION THROMBOL ARTERIAL INITIAL (MS)  12/10/2017   IR THROMB F/U EVAL ART/VEN FINAL DAY (MS)  12/11/2017   IR US  GUIDE VASC ACCESS RIGHT  12/10/2017   UPPER ESOPHAGEAL ENDOSCOPIC ULTRASOUND (EUS) N/A 07/17/2022   Procedure: UPPER ESOPHAGEAL ENDOSCOPIC ULTRASOUND (EUS);  Surgeon: Wilhelmenia Aloha Raddle., MD;  Location: [facility];  Service: Gastroenterology;  Laterality: N/A;   Social History   Occupational History   Not on file  Tobacco Use   Smoking status: Former    Current packs/day: 0.00    Average packs/day: 0.3 packs/day for 20.0 years (5.0 ttl pk-yrs)    Types: Cigarettes    Start date: 10/16/1997    Quit date: 10/16/2017    Years since quitting: 6.5   Smokeless tobacco: Never  Vaping Use   Vaping status: Never Used  Substance and Sexual Activity   Alcohol use: No   Drug use: Yes    Types: Marijuana    Comment: occ marijuana last used sept 2021   Sexual activity: Not on file

## 2024-06-06 DIAGNOSIS — Z7901 Long term (current) use of anticoagulants: Secondary | ICD-10-CM | POA: Diagnosis not present

## 2024-06-10 ENCOUNTER — Other Ambulatory Visit: Payer: Self-pay

## 2024-06-10 DIAGNOSIS — M1711 Unilateral primary osteoarthritis, right knee: Secondary | ICD-10-CM

## 2024-06-11 ENCOUNTER — Ambulatory Visit: Admitting: Family

## 2024-06-25 ENCOUNTER — Ambulatory Visit: Admitting: Family Medicine

## 2024-07-21 ENCOUNTER — Telehealth: Payer: Self-pay | Admitting: Family Medicine

## 2024-07-21 NOTE — Telephone Encounter (Signed)
 1st attempt resch appt with pt

## 2024-07-24 ENCOUNTER — Ambulatory Visit: Admitting: Family Medicine

## 2024-08-04 DIAGNOSIS — E785 Hyperlipidemia, unspecified: Secondary | ICD-10-CM | POA: Diagnosis not present

## 2024-08-04 DIAGNOSIS — I2699 Other pulmonary embolism without acute cor pulmonale: Secondary | ICD-10-CM | POA: Diagnosis not present

## 2024-08-12 DIAGNOSIS — Z7901 Long term (current) use of anticoagulants: Secondary | ICD-10-CM | POA: Diagnosis not present

## 2024-08-12 DIAGNOSIS — I10 Essential (primary) hypertension: Secondary | ICD-10-CM | POA: Diagnosis not present

## 2024-08-12 DIAGNOSIS — M1990 Unspecified osteoarthritis, unspecified site: Secondary | ICD-10-CM | POA: Diagnosis not present

## 2024-08-12 DIAGNOSIS — Z86711 Personal history of pulmonary embolism: Secondary | ICD-10-CM | POA: Diagnosis not present

## 2024-08-12 DIAGNOSIS — E669 Obesity, unspecified: Secondary | ICD-10-CM | POA: Diagnosis not present

## 2024-08-12 DIAGNOSIS — R2681 Unsteadiness on feet: Secondary | ICD-10-CM | POA: Diagnosis not present

## 2024-08-12 DIAGNOSIS — Z9989 Dependence on other enabling machines and devices: Secondary | ICD-10-CM | POA: Diagnosis not present

## 2024-08-12 DIAGNOSIS — N393 Stress incontinence (female) (male): Secondary | ICD-10-CM | POA: Diagnosis not present

## 2024-08-12 DIAGNOSIS — Z8249 Family history of ischemic heart disease and other diseases of the circulatory system: Secondary | ICD-10-CM | POA: Diagnosis not present

## 2024-08-12 DIAGNOSIS — R42 Dizziness and giddiness: Secondary | ICD-10-CM | POA: Diagnosis not present

## 2024-08-12 DIAGNOSIS — E785 Hyperlipidemia, unspecified: Secondary | ICD-10-CM | POA: Diagnosis not present

## 2024-08-12 DIAGNOSIS — Z86718 Personal history of other venous thrombosis and embolism: Secondary | ICD-10-CM | POA: Diagnosis not present

## 2024-08-12 DIAGNOSIS — K219 Gastro-esophageal reflux disease without esophagitis: Secondary | ICD-10-CM | POA: Diagnosis not present

## 2024-08-14 ENCOUNTER — Ambulatory Visit: Admitting: Family Medicine

## 2024-08-14 ENCOUNTER — Ambulatory Visit: Payer: Self-pay

## 2024-08-14 NOTE — Telephone Encounter (Signed)
 FYI Only or Action Required?: FYI only for provider: advised acute visit or urgent care, patient states she will call back.  Patient was last seen in primary care on 12/24/2023 by Newlin, Enobong, MD.  Called Nurse Triage reporting Leg Pain.  Symptoms began several years ago.  Interventions attempted: OTC medications: Tylenol .  Symptoms are: chronic bilateral leg pain, worse on right leg, bilateral feet swelling gradually worsening.  Triage Disposition: See HCP Within 4 Hours (Or PCP Triage)  Patient/caregiver understands and will follow disposition?: Yes         Copied from CRM [id number]. Topic: Clinical - Red Word Triage >> Aug 14, 2024  8:32 AM Joesph NOVAK wrote: Red Word that prompted transfer to Nurse Triage:  Patient states her legs in pain when [employer] came in for a health assessment. Reason for Disposition  History of prior blood clot in leg or lungs (i.e., deep vein thrombosis, pulmonary embolism)  Answer Assessment - Initial Assessment Questions 1. ONSET: When did the pain start?      She states every morning when she gets up she has a lot of pain in her legs. Several years.  2. LOCATION: Where is the pain located?      Bilateral legs, worse in the right leg. Whole leg.  3. PAIN: How bad is the pain?    (Scale 1-10; or mild, moderate, severe)     7-8/10. She states she takes Tylenol  for the pain, none taken today. She states it helps sometimes.  4. WORK OR EXERCISE: Has there been any recent work or exercise that involved this part of the body?      She states on Tuesday, [employer] sent someone over who was messing with her feet and she states the pain has been worse since then. She states her legs are very sensitive.  5. CAUSE: What do you think is causing the leg pain?     Unsure.  6. OTHER SYMPTOMS: Do you have any other symptoms? (e.g., chest pain, back pain, breathing difficulty, swelling, rash, fever, numbness, weakness)     She states in the morning  when she moves around there is swelling in both feet. Denies chest pain, SOB, calf pain or swelling, rash, fever, numbness, weakness.  Protocols used: Leg Pain-A-AH

## 2024-09-22 ENCOUNTER — Ambulatory Visit: Admitting: Family Medicine

## 2024-11-18 ENCOUNTER — Ambulatory Visit: Payer: Self-pay | Admitting: Family Medicine

## 2024-12-09 ENCOUNTER — Ambulatory Visit: Payer: Medicare HMO

## 2024-12-31 ENCOUNTER — Ambulatory Visit: Admitting: Family Medicine
